# Patient Record
Sex: Male | Born: 1940 | Race: White | Hispanic: No | Marital: Married | Smoking: Former smoker
Health system: Southern US, Community
[De-identification: ages and names within clinical notes are randomized; demographics above are authoritative.]

## PROBLEM LIST (undated history)

## (undated) DIAGNOSIS — F419 Anxiety disorder, unspecified: Secondary | ICD-10-CM

## (undated) DIAGNOSIS — D369 Benign neoplasm, unspecified site: Secondary | ICD-10-CM

## (undated) DIAGNOSIS — G629 Polyneuropathy, unspecified: Secondary | ICD-10-CM

## (undated) DIAGNOSIS — G589 Mononeuropathy, unspecified: Secondary | ICD-10-CM

## (undated) DIAGNOSIS — K649 Unspecified hemorrhoids: Secondary | ICD-10-CM

## (undated) DIAGNOSIS — J449 Chronic obstructive pulmonary disease, unspecified: Secondary | ICD-10-CM

## (undated) DIAGNOSIS — I10 Essential (primary) hypertension: Secondary | ICD-10-CM

## (undated) DIAGNOSIS — G600 Hereditary motor and sensory neuropathy: Secondary | ICD-10-CM

## (undated) DIAGNOSIS — Z8701 Personal history of pneumonia (recurrent): Secondary | ICD-10-CM

## (undated) HISTORY — DX: Benign neoplasm, unspecified site: D36.9

## (undated) HISTORY — PX: CATARACT EXTRACTION, BILATERAL: SHX1313

## (undated) HISTORY — PX: MASTECTOMY FOR GYNECOMASTIA: SUR847

## (undated) HISTORY — DX: Mononeuropathy, unspecified: G58.9

## (undated) HISTORY — PX: TONSILLECTOMY: SUR1361

## (undated) HISTORY — DX: Unspecified hemorrhoids: K64.9

---

## 1994-05-27 DIAGNOSIS — G589 Mononeuropathy, unspecified: Secondary | ICD-10-CM

## 1994-05-27 HISTORY — DX: Mononeuropathy, unspecified: G58.9

## 2005-04-02 ENCOUNTER — Other Ambulatory Visit: Admission: RE | Admit: 2005-04-02 | Discharge: 2005-04-02 | Payer: Self-pay | Admitting: Dermatology

## 2005-04-17 ENCOUNTER — Ambulatory Visit (HOSPITAL_COMMUNITY): Admission: RE | Admit: 2005-04-17 | Discharge: 2005-04-17 | Payer: Self-pay | Admitting: Family Medicine

## 2007-09-10 ENCOUNTER — Ambulatory Visit (HOSPITAL_COMMUNITY): Admission: RE | Admit: 2007-09-10 | Discharge: 2007-09-10 | Payer: Self-pay | Admitting: Family Medicine

## 2008-03-14 ENCOUNTER — Ambulatory Visit (HOSPITAL_COMMUNITY): Admission: RE | Admit: 2008-03-14 | Discharge: 2008-03-14 | Payer: Self-pay | Admitting: Ophthalmology

## 2009-03-13 ENCOUNTER — Ambulatory Visit (HOSPITAL_COMMUNITY): Admission: RE | Admit: 2009-03-13 | Discharge: 2009-03-13 | Payer: Self-pay | Admitting: Ophthalmology

## 2009-09-27 ENCOUNTER — Ambulatory Visit (HOSPITAL_COMMUNITY): Admission: RE | Admit: 2009-09-27 | Discharge: 2009-09-27 | Payer: Self-pay | Admitting: Family Medicine

## 2010-03-20 ENCOUNTER — Encounter: Payer: Self-pay | Admitting: Internal Medicine

## 2010-03-29 ENCOUNTER — Encounter: Payer: Self-pay | Admitting: Internal Medicine

## 2010-04-16 ENCOUNTER — Ambulatory Visit (HOSPITAL_COMMUNITY): Admission: RE | Admit: 2010-04-16 | Discharge: 2010-04-16 | Payer: Self-pay | Admitting: Internal Medicine

## 2010-04-16 ENCOUNTER — Ambulatory Visit: Payer: Self-pay | Admitting: Internal Medicine

## 2010-04-16 HISTORY — PX: COLONOSCOPY: SHX174

## 2010-04-18 ENCOUNTER — Encounter: Payer: Self-pay | Admitting: Internal Medicine

## 2010-06-26 NOTE — Letter (Signed)
Summary: [facility]CS ORDER/TRIAGE  TCS ORDER/TRIAGE   Imported By: Rexene Alberts 03/29/2010 11:41:03  _____________________________________________________________________  External Attachment:    Type:   Image     Comment:   External Document  Appended Document: Patient Notice, Colon Biopsy Results letter mailed to pt

## 2010-06-26 NOTE — Letter (Signed)
Summary: REFERRAL FROM DR Phillips Odor  REFERRAL FROM DR Phillips Odor   Imported By: Rexene Alberts 03/20/2010 15:49:13  _____________________________________________________________________  External Attachment:    Type:   Image     Comment:   External Document

## 2010-06-28 NOTE — Letter (Signed)
Summary: Patient Notice, Colon Biopsy Results  [facility]  [address]   [place], [place] [address]   Phone: [phone]  Fax: [phone]       April 18, 2010   Erik Atkins [address] [place], [place]  [address] [birth year]    Dear Erik Atkins,  I am pleased to inform you that the biopsies taken during your recent colonoscopy did not show any evidence of cancer upon pathologic examination.  Additional information/recommendations:  You should have a repeat colonoscopy examination  in 3 years.  Please call us if you are having persistent problems or have questions about your condition that have not been fully answered at this time.  Sincerely,    R. Roetta Sessions MD, FACP [facility] Ph: [phone]    Fax: [phone]   Appended Document: Patient Notice, Colon Biopsy Results letter mailed to pt  Appended Document: Patient Notice, Colon Biopsy Results reminder in computer

## 2010-08-09 ENCOUNTER — Other Ambulatory Visit (HOSPITAL_COMMUNITY): Payer: Self-pay | Admitting: Family Medicine

## 2010-08-09 ENCOUNTER — Encounter (HOSPITAL_COMMUNITY): Payer: Self-pay

## 2010-08-09 ENCOUNTER — Ambulatory Visit (HOSPITAL_COMMUNITY)
Admission: RE | Admit: 2010-08-09 | Discharge: 2010-08-09 | Disposition: A | Discharge status: Home / Self Care | Payer: PRIVATE HEALTH INSURANCE | Source: Ambulatory Visit | Attending: Family Medicine | Admitting: Family Medicine

## 2010-08-09 DIAGNOSIS — R0989 Other specified symptoms and signs involving the circulatory and respiratory systems: Secondary | ICD-10-CM | POA: Insufficient documentation

## 2010-08-09 HISTORY — DX: Essential (primary) hypertension: I10

## 2010-08-30 LAB — HEMOGLOBIN AND HEMATOCRIT, BLOOD
HCT: 43.6 % (ref 39.0–52.0)
Hemoglobin: 15.2 g/dL (ref 13.0–17.0)

## 2010-08-30 LAB — BASIC METABOLIC PANEL
BUN: 8 mg/dL (ref 6–23)
CO2: 30 mEq/L (ref 19–32)
Calcium: 8.9 mg/dL (ref 8.4–10.5)
Chloride: 88 mEq/L — ABNORMAL LOW (ref 96–112)
Creatinine, Ser: 0.67 mg/dL (ref 0.4–1.5)
GFR calc Af Amer: >60 mL/min (ref >60)
GFR calc non Af Amer: >60 mL/min (ref >60)
Glucose, Bld: 93 mg/dL (ref 70–99)
Potassium: 4 mEq/L (ref 3.5–5.1)
Sodium: 126 mEq/L — ABNORMAL LOW (ref 135–145)

## 2011-02-26 LAB — BASIC METABOLIC PANEL
BUN: 9
CO2: 27
Calcium: 8.9
Chloride: 94 — ABNORMAL LOW
Creatinine, Ser: 0.71
GFR calc Af Amer: >60
GFR calc non Af Amer: >60
Glucose, Bld: 132 — ABNORMAL HIGH
Potassium: 3.7
Sodium: 128 — ABNORMAL LOW

## 2011-02-26 LAB — GLUCOSE, CAPILLARY: Glucose-Capillary: 126 — ABNORMAL HIGH

## 2011-02-26 LAB — HEMOGLOBIN AND HEMATOCRIT, BLOOD
HCT: 44
Hemoglobin: 15.5

## 2011-06-03 ENCOUNTER — Encounter (HOSPITAL_COMMUNITY): Payer: Self-pay | Admitting: Oncology

## 2011-06-03 ENCOUNTER — Encounter (HOSPITAL_COMMUNITY): Payer: Medicare Other | Attending: Oncology | Admitting: Oncology

## 2011-06-03 VITALS — BP 172/80 | HR 64 | Temp 97.6°F | Ht 63.5 in | Wt 136.0 lb

## 2011-06-03 DIAGNOSIS — D72829 Elevated white blood cell count, unspecified: Secondary | ICD-10-CM

## 2011-06-03 DIAGNOSIS — J449 Chronic obstructive pulmonary disease, unspecified: Secondary | ICD-10-CM | POA: Insufficient documentation

## 2011-06-03 DIAGNOSIS — J4489 Other specified chronic obstructive pulmonary disease: Secondary | ICD-10-CM | POA: Insufficient documentation

## 2011-06-03 LAB — CBC
HCT: 46.3 % (ref 39.0–52.0)
Hemoglobin: 16.1 g/dL (ref 13.0–17.0)
MCH: 35.2 pg — ABNORMAL HIGH (ref 26.0–34.0)
MCHC: 34.8 g/dL (ref 30.0–36.0)
Platelets: 341 10*3/uL (ref 150–400)
RBC: 4.58 MIL/uL (ref 4.22–5.81)
RDW: 12.2 % (ref 11.5–15.5)
WBC: 14.5 10*3/uL — ABNORMAL HIGH (ref 4.0–10.5)

## 2011-06-03 LAB — DIFFERENTIAL
Basophils Absolute: 0 10*3/uL (ref 0.0–0.1)
Basophils Relative: 0 % (ref 0–1)
Eosinophils Relative: 2 % (ref 0–5)
Lymphocytes Relative: 13 % (ref 12–46)
Lymphs Abs: 1.9 10*3/uL (ref 0.7–4.0)
Monocytes Absolute: 1.3 10*3/uL — ABNORMAL HIGH (ref 0.1–1.0)
Monocytes Relative: 9 % (ref 3–12)
Neutro Abs: 11.1 10*3/uL — ABNORMAL HIGH (ref 1.7–7.7)
Neutrophils Relative %: 76 % (ref 43–77)

## 2011-06-03 NOTE — Progress Notes (Signed)
CC:   Corrie Mckusick, M.D.  DIAGNOSES: 1. Leukocytosis, unclear etiology. 2. Charcot-Marie-Tooth disease with peripheral neuropathy and wasting     of peripheral musculature. 3. Ulcer on the second toe of the right foot distally, probably     secondary to a combination of peripheral vascular disease and     irritation from deviation of the toes laterally. 4. Peripheral vascular disease with absence of his dorsalis pedis and     posterior tibialis pulses. 5. History of smoking 1-2 packs of cigarettes a day since age 41. 6. History of bilateral cataract operations with lens implants. 7. History tonsillectomy. 8. History mastectomy bilaterally for gynecomastia years ago. 9. Chronic anxiety and, I believe, mild depression. 10.Prednisone intolerance, but that is questionable.  HISTORY:  This is a pleasant, 71 year old gentleman, long-time smoker, who is here today for an elevated white count that has been present since, at least, April 2009.  His white count that time was 12,300 with a left shift and a slight increase in monocytes.  It has gradually risen to a level of 14,800, as of 05/15/2011, still with an absolute granulocyte count that is elevated at 11,300, and a monocyte count this almost exactly the same at 1,200,.  His MCV is 101.8 and hemoglobin 17.6.  The platelet counts have been normal at 280,000.  His MCV, interestingly, has gone down from 103.6 in 2009 to 101.8 in December.  He is here today with his wife of 36 years.  They have no children.  He was married before but has no children by that relationship either.  He drinks about 5 beers a day.  He smokes, as I mentioned, a pack a day to 2 packs a day.  He is presently at 1 pack a day.  FAMILY HISTORY:  His father may well have had the same disorder, but he is not absolutely sure.  His mother lived to be 40 and died of complications of diabetes.  He has a half-brother who died of complications of cirrhosis of the liver  from alcohol.  REVIEW OF SYSTEMS:  Remarked for numbness, tingling, and pain in his legs and feet.  This little ulcer on the second toe medially on the right foot and distally on that toe.  He has absent pulses in his feet. He uses a cane to walk.  He does not have good balance, he states.  He has lost a lot of muscle mass, he states, over the years.  PHYSICAL EXAMINATION:  General Appearance:  He is afebrile.  He states he does not run fevers or chills or night sweats at home.  Vital Signs: Blood pressure 172/80 today.  His pulse is 64 and regular.  Respirations 20 and unlabored at rest.  He does have a pain score of 7 and it is all in his legs and feet, he states.  He has a lot of atrophy of his leg musculature.  A small, 4 mm, ulcerated area distal right second toe, which we cleaned with Betadine and put a Telfa pad on.  He has absence of pulses in his feet.  Radial pulses are intact.  He has decreased venous return on his nails.  He has no adenopathy.  No hepatosplenomegaly.  He has no obvious gynecomastia presently.  Lungs: Marked rhonchi, wheezes, and a few rales intermittently in both lungs, some which clear with coughing.  He has no obvious thyromegaly.  Teeth are in fair repair.  Throat is clear.  Tongue is normal in  the midline. Pupils show the change from prior cataract surgery.  He does not have ankle edema.  No arm edema.  He is alert and very oriented.  He is very hard of hearing at times.  ASSESSMENT AND PLAN:  Mr. Buresh, I believe, has a reactive leukocytosis.  Since he has had it for some time, we will check a BCR/ABL, but I suspect it will be negative.  We will see him back in a couple weeks.  I will try to review his smear and if we need to do more blood work, we will get it done in the very near future.    ______________________________ Ladona Horns. Mariel Sleet, MD ESN/MEDQ  D:  06/03/2011  T:  06/03/2011  Job:  [id number]

## 2011-06-03 NOTE — Progress Notes (Signed)
This office note has been dictated.

## 2011-06-03 NOTE — Patient Instructions (Signed)
Erik Atkins  [id number] 1940-12-27  [facility]  Discharge Instructions  RECOMMENDATIONS MADE BY THE CONSULTANT AND ANY TEST RESULTS WILL BE SENT TO YOUR REFERRING DOCTOR.   EXAM FINDINGS BY MD TODAY AND SIGNS AND SYMPTOMS TO REPORT TO CLINIC OR PRIMARY MD: Per MD you need to stop smoking to help preserve/improve your lungs. Your white blood cell elevation may be due to the lung issues.  MD not concerned about potential leukemia.  MEDICATIONS PRESCRIBED: none      SPECIAL INSTRUCTIONS/FOLLOW-UP: Xray Studies Needed January 10 at 1:45pm and Return to Clinic on after results available.   I acknowledge that I have been informed and understand all the instructions given to me and received a copy. I do not have any more questions at this time, but understand that I may call the Specialty Clinic at [facility] at [phone] during business hours should I have any further questions or need assistance in obtaining follow-up care.    __________________________________________  _____________  __________ Signature of Patient or Authorized Representative            Date                   Time    __________________________________________ Nurse's Signature

## 2011-06-04 LAB — FOLATE: Folate: 13.5 ng/mL

## 2011-06-04 LAB — VITAMIN B12: Vitamin B-12: 513 pg/mL (ref 211–911)

## 2011-06-06 ENCOUNTER — Ambulatory Visit (HOSPITAL_COMMUNITY)
Admission: RE | Admit: 2011-06-06 | Discharge: 2011-06-06 | Disposition: A | Discharge status: Home / Self Care | Payer: Medicare Other | Source: Ambulatory Visit | Attending: Oncology | Admitting: Oncology

## 2011-06-06 DIAGNOSIS — R062 Wheezing: Secondary | ICD-10-CM | POA: Insufficient documentation

## 2011-06-06 DIAGNOSIS — J449 Chronic obstructive pulmonary disease, unspecified: Secondary | ICD-10-CM

## 2011-06-06 DIAGNOSIS — D72829 Elevated white blood cell count, unspecified: Secondary | ICD-10-CM | POA: Insufficient documentation

## 2011-06-06 DIAGNOSIS — F17200 Nicotine dependence, unspecified, uncomplicated: Secondary | ICD-10-CM | POA: Insufficient documentation

## 2011-06-06 DIAGNOSIS — R0989 Other specified symptoms and signs involving the circulatory and respiratory systems: Secondary | ICD-10-CM | POA: Insufficient documentation

## 2011-06-07 LAB — BCR/ABL GENE REARRANGEMENT QNT, PCR
BCR ABL1 / ABL1 IS: 0 not reported
BCR/ABL Source: 0
Prior result - BCRQ: 0

## 2011-06-18 ENCOUNTER — Encounter (HOSPITAL_BASED_OUTPATIENT_CLINIC_OR_DEPARTMENT_OTHER): Payer: Medicare Other | Admitting: Oncology

## 2011-06-18 VITALS — BP 173/84 | HR 80 | Temp 97.4°F | Ht 63.5 in | Wt 135.0 lb

## 2011-06-18 DIAGNOSIS — J449 Chronic obstructive pulmonary disease, unspecified: Secondary | ICD-10-CM

## 2011-06-18 DIAGNOSIS — D72829 Elevated white blood cell count, unspecified: Secondary | ICD-10-CM

## 2011-06-18 DIAGNOSIS — J479 Bronchiectasis, uncomplicated: Secondary | ICD-10-CM

## 2011-06-18 DIAGNOSIS — L97509 Non-pressure chronic ulcer of other part of unspecified foot with unspecified severity: Secondary | ICD-10-CM

## 2011-06-18 NOTE — Progress Notes (Signed)
This office note has been dictated.

## 2011-06-18 NOTE — Patient Instructions (Signed)
Erik Atkins  [id number] Dec 06, 1940   [facility]  Discharge Instructions  RECOMMENDATIONS MADE BY THE CONSULTANT AND ANY TEST RESULTS WILL BE SENT TO YOUR REFERRING DOCTOR.   EXAM FINDINGS BY MD TODAY AND SIGNS AND SYMPTOMS TO REPORT TO CLINIC OR PRIMARY MD: We will refer you to Dr. Juanetta Gosling for a pulmonary consultation.  Think the abnormalities in your blood counts may be due to what's going on in your lungs.  Will release you from our clinic.  We will call you with the appointment with Dr. Juanetta Gosling.  MEDICATIONS PRESCRIBED: none         I acknowledge that I have been informed and understand all the instructions given to me and received a copy. I do not have any more questions at this time, but understand that I may call the Specialty Clinic at [facility] at [phone] during business hours should I have any further questions or need assistance in obtaining follow-up care.    __________________________________________  _____________  __________ Signature of Patient or Authorized Representative            Date                   Time    __________________________________________ Nurse's Signature

## 2011-06-18 NOTE — Progress Notes (Signed)
CC:   Corrie Mckusick, M.D. Edward L. Juanetta Gosling, M.D.  DIAGNOSES: 1. Leukocytosis most likely reactive. 2. Chronic obstructive pulmonary disease with what appears to be     bronchiectasis on the CT scan performed on 06/06/2011, and the     bronchiectasis was present in the right lower lobe. 3. Charcot-Marie-Tooth disease with peripheral neuropathy, wasting of     his peripheral musculature. 4. Ulcer on the second toe of the right foot distally probably     secondary to a combination of peripheral vascular disease and     irritation from deviation of the toes laterally. 5. Peripheral vascular disease with absence of his dorsalis pedis and     posterior tibialis pulses. 6. History of smoking 1-2 packs of cigarettes a day since age 15. 7. History of bilateral cataract operations with lens implants. 8. History of tonsillectomy. 9. Mastectomy bilaterally for gynecomastia many years ago. 10.Chronic anxiety and I believe mild depression. 11.Prednisone intolerance he states.  Jeramey's peripheral smear did not reveal obvious leukemic cells and so we sent off his blood to be on the safe side for PCR analysis for BCR-ABL and that was negative.  We checked him for B12 and folic acid levels. They were both negative.  I did that because his MCV was mildly elevated but was definitely lower than it was several years ago when it was 103. So I think though this gentleman's leukocytosis is most likely reactive to a chronic inflammation, whether it is from the toe ulcer and his peripheral vascular disease or whether it is from his bronchiectasis and his lung disease is a moot point at this juncture.  I do think he needs to see Dr. Juanetta Gosling in consultation for this bronchiectasis.  They are agreeable to that consultation.  We will set that up in the very near future, and I will talk to Dr. Juanetta Gosling myself about him.  I think from our standpoint we can release him at this juncture.  I do not think he  warrants a bone marrow aspirate and biopsy at this time.  So will be on standby should he need to see [place] again.  I have encouraged him once again to quit smoking.  He states he is down to 10 cigarettes a day.    ______________________________ Ladona Horns. Mariel Sleet, MD ESN/MEDQ  D:  06/18/2011  T:  06/18/2011  Job:  [id number]

## 2011-07-10 ENCOUNTER — Ambulatory Visit (HOSPITAL_COMMUNITY)
Admission: RE | Admit: 2011-07-10 | Discharge: 2011-07-10 | Disposition: A | Discharge status: Home / Self Care | Payer: Medicare Other | Source: Ambulatory Visit | Attending: Pulmonary Disease | Admitting: Pulmonary Disease

## 2011-07-10 DIAGNOSIS — R0602 Shortness of breath: Secondary | ICD-10-CM | POA: Insufficient documentation

## 2011-07-10 LAB — BLOOD GAS, ARTERIAL
Acid-Base Excess: 0.4 mmol/L (ref 0.0–2.0)
Bicarbonate: 24.5 mEq/L — ABNORMAL HIGH (ref 20.0–24.0)
O2 Saturation: 95.7 %
Patient temperature: 37
pCO2 arterial: 39.7 mmHg (ref 35.0–45.0)
pH, Arterial: 7.408 (ref 7.350–7.450)

## 2011-07-10 MED ORDER — ALBUTEROL SULFATE (5 MG/ML) 0.5% IN NEBU
2.5000 mg | INHALATION_SOLUTION | Freq: Once | RESPIRATORY_TRACT | Status: AC
  Administered 2011-07-10: 2.5 mg via RESPIRATORY_TRACT

## 2011-07-15 NOTE — Procedures (Signed)
NAMEMARDY, LUCIER NO.:  [id number]  MEDICAL RECORD NO.:  [id number]  LOCATION:  RESP                          FACILITY:  [facility]  PHYSICIAN:  Raeana Blinn L. Juanetta Gosling, M.D.DATE OF BIRTH:  1941-05-10  DATE OF PROCEDURE: DATE OF DISCHARGE:  07/10/2011                           PULMONARY FUNCTION TEST   Reason for pulmonary function testing is shortness of breath. 1. Spirometry shows a moderate-to-severe ventilatory defect with     evidence of airflow obstruction. 2. Lung volumes show no evidence of restrictive change, but do show     air trapping. 3. DLCO is moderately-to-severely reduced. 4. Airway resistance is elevated. 5. There was no significant bronchodilator improvement. 6. Arterial blood gas is normal.     Jaheim Canino L. Juanetta Gosling, M.D.     ELH/MEDQ  D:  07/14/2011  T:  07/15/2011  Job:  [id number]

## 2011-12-24 ENCOUNTER — Other Ambulatory Visit: Payer: Self-pay | Admitting: Neurology

## 2011-12-24 DIAGNOSIS — R2681 Unsteadiness on feet: Secondary | ICD-10-CM

## 2011-12-24 DIAGNOSIS — R29898 Other symptoms and signs involving the musculoskeletal system: Secondary | ICD-10-CM

## 2011-12-27 ENCOUNTER — Ambulatory Visit (HOSPITAL_COMMUNITY)
Admission: RE | Admit: 2011-12-27 | Discharge: 2011-12-27 | Disposition: A | Discharge status: Home / Self Care | Payer: Medicare Other | Source: Ambulatory Visit | Attending: Neurology | Admitting: Neurology

## 2011-12-27 DIAGNOSIS — R269 Unspecified abnormalities of gait and mobility: Secondary | ICD-10-CM | POA: Insufficient documentation

## 2011-12-27 DIAGNOSIS — R2681 Unsteadiness on feet: Secondary | ICD-10-CM

## 2011-12-27 DIAGNOSIS — R29898 Other symptoms and signs involving the musculoskeletal system: Secondary | ICD-10-CM

## 2012-01-31 ENCOUNTER — Encounter (HOSPITAL_COMMUNITY): Payer: Self-pay | Admitting: Pharmacy Technician

## 2012-01-31 ENCOUNTER — Other Ambulatory Visit: Payer: Self-pay | Admitting: Neurosurgery

## 2012-02-04 ENCOUNTER — Encounter (HOSPITAL_COMMUNITY)
Admission: RE | Admit: 2012-02-04 | Discharge: 2012-02-04 | Disposition: A | Discharge status: Home / Self Care | Payer: Medicare Other | Source: Ambulatory Visit | Attending: Neurosurgery | Admitting: Neurosurgery

## 2012-02-04 ENCOUNTER — Encounter (HOSPITAL_COMMUNITY): Payer: Self-pay

## 2012-02-04 HISTORY — DX: Anxiety disorder, unspecified: F41.9

## 2012-02-04 HISTORY — DX: Personal history of pneumonia (recurrent): Z87.01

## 2012-02-04 HISTORY — DX: Polyneuropathy, unspecified: G62.9

## 2012-02-04 HISTORY — DX: Hereditary motor and sensory neuropathy: G60.0

## 2012-02-04 HISTORY — DX: Chronic obstructive pulmonary disease, unspecified: J44.9

## 2012-02-04 NOTE — Pre-Procedure Instructions (Signed)
20 REVANTH NEIDIG  02/04/2012   Your procedure is scheduled on:  September 12  Report to [facility] at 11:30 AM.  Call this number if you have problems the morning of surgery: [phone]   Remember:   Do not eat or drink:After Midnight.  Take these medicines the morning of surgery with A SIP OF WATER: Xanax, Gabapentin, Hydrocodone, Spiriva   Do not wear jewelry, make-up or nail polish.  Do not wear lotions, powders, or perfumes. You may wear deodorant.  Do not shave 48 hours prior to surgery. Men may shave face and neck.  Do not bring valuables to the hospital.  Contacts, dentures or bridgework may not be worn into surgery.  Leave suitcase in the car. After surgery it may be brought to your room.  For patients admitted to the hospital, checkout time is 11:00 AM the day of discharge.   Special Instructions: CHG Shower Use Special Wash: 1/2 bottle night before surgery and 1/2 bottle morning of surgery.   Please read over the following fact sheets that you were given: Pain Booklet, Coughing and Deep Breathing and Surgical Site Infection Prevention

## 2012-02-04 NOTE — Consult Note (Signed)
Anesthesia Consult:  Patient is a 71 year old male scheduled for C3-4, C4-5 posterior laminectomy and fusion on 02/06/12 by Dr. Lovell Sheehan.  I spoke with him during his PAT visit on 02/04/12.  History includes Charcot-Marie-Tooth disease (diagnosed 17 years ago) with peripheral neuropathy and wasting of his peripheral musculture, HTN on Aldoril, COPD, smoking, cataract extractions, gynecomastia s/p bilateral mastectomies, anxiety.  In January 2013, he was evaluated by Hematologist Dr. Mariel Sleet for leukocytosis, felt likely reactive.  His Neurologist is Dr. Beryle Beams in [place], records on chart.  EKG on 02/04/12 showed NSR, LAFB.  Inverted T wave in aVL.  He denies history of prior cardiac testing such as stress, echo, or cath.  He denies chest pain, SOB, and LE edema.  He is not very active due to Charcot-Marie-Tooth disease.  He uses a rolling walker to ambulate.  He has chronic "nerve" pain in BLE and has problems with balance.  Both arms have numbness and pain which is believed related to his c-spine disease.  CXR on 02/04/12 showed stable COPD, no active disease.  PFT on 07/10/11 interpretation by Dr. Kari Baars showed:  1. Spirometry shows a moderate-to-severe ventilatory defect with evidence of airflow obstruction.  2. Lung volumes show no evidence of restrictive change, but do show air trapping.  3. DLCO is moderately-to-severely reduced.  4. Airway resistance is elevated.  5. There was no significant bronchodilator improvement.  6. Arterial blood gas is normal.   Labs done on 01/24/12 at Dr. Lamar Blinks office show a Na 131, K 48, BUN/Cr 12057, glucose 90.  WBC 10.7, H/H 15.4/44.8, PLT 311.  AST/ALT WNL.  I reviewed Charcot-Marie-Tooth disease with Anesthesiologist Dr. Gypsy Balsam and EKGs with Dr. Krista Blue.  Anticipate he can proceed as planned.  Shonna Chock, PA-C

## 2012-02-04 NOTE — Anesthesia Preprocedure Evaluation (Addendum)
Anesthesia Evaluation  Patient identified by MRN, date of birth, ID band Patient awake    Reviewed: Allergy & Precautions, H&P , NPO status , Patient's Chart, lab work & pertinent test results  History of Anesthesia Complications Negative for: history of anesthetic complications  Airway Mallampati: II TM Distance: >3 FB Neck ROM: Full    Dental  (+) Teeth Intact, Dental Advisory Given and Caps   Pulmonary COPD COPD inhaler,  breath sounds clear to auscultation  Pulmonary exam normal       Cardiovascular hypertension, Rhythm:Regular Rate:Normal     Neuro/Psych PSYCHIATRIC DISORDERS Anxiety Charcot-Marie-Tooth Syndrome    GI/Hepatic negative GI ROS, Neg liver ROS,   Endo/Other    Renal/GU negative Renal ROS     Musculoskeletal   Abdominal   Peds  Hematology negative hematology ROS (+)   Anesthesia Other Findings   Reproductive/Obstetrics                          Anesthesia Physical Anesthesia Plan  ASA: III  Anesthesia Plan: General   Post-op Pain Management:    Induction: Intravenous  Airway Management Planned: Oral ETT  Additional Equipment:   Intra-op Plan:   Post-operative Plan:   Informed Consent: I have reviewed the patients History and Physical, chart, labs and discussed the procedure including the risks, benefits and alternatives for the proposed anesthesia with the patient or authorized representative who has indicated his/her understanding and acceptance.   Dental advisory given  Plan Discussed with: CRNA, Anesthesiologist and Surgeon  Anesthesia Plan Comments:         Anesthesia Quick Evaluation

## 2012-02-05 MED ORDER — VANCOMYCIN HCL IN DEXTROSE 1-5 GM/200ML-% IV SOLN
1000.0000 mg | INTRAVENOUS | Status: AC
  Administered 2012-02-06: 1000 mg via INTRAVENOUS
  Filled 2012-02-05: qty 200

## 2012-02-06 ENCOUNTER — Encounter (HOSPITAL_COMMUNITY): Payer: Self-pay | Admitting: Vascular Surgery

## 2012-02-06 ENCOUNTER — Encounter (HOSPITAL_COMMUNITY): Payer: Self-pay | Admitting: *Deleted

## 2012-02-06 ENCOUNTER — Inpatient Hospital Stay (HOSPITAL_COMMUNITY): Payer: Medicare Other

## 2012-02-06 ENCOUNTER — Inpatient Hospital Stay (HOSPITAL_COMMUNITY)
Admission: RE | Admit: 2012-02-06 | Discharge: 2012-02-12 | DRG: 473 | Disposition: A | Discharge status: Home / Self Care | Payer: Medicare Other | Source: Ambulatory Visit | Attending: Neurosurgery | Admitting: Neurosurgery

## 2012-02-06 ENCOUNTER — Encounter (HOSPITAL_COMMUNITY)
Admission: RE | Disposition: A | Discharge status: Home / Self Care | Payer: Self-pay | Source: Ambulatory Visit | Attending: Neurosurgery

## 2012-02-06 ENCOUNTER — Inpatient Hospital Stay (HOSPITAL_COMMUNITY): Payer: Medicare Other | Admitting: Vascular Surgery

## 2012-02-06 DIAGNOSIS — D72829 Elevated white blood cell count, unspecified: Secondary | ICD-10-CM

## 2012-02-06 DIAGNOSIS — Z91018 Allergy to other foods: Secondary | ICD-10-CM

## 2012-02-06 DIAGNOSIS — M4712 Other spondylosis with myelopathy, cervical region: Principal | ICD-10-CM | POA: Diagnosis present

## 2012-02-06 DIAGNOSIS — Z01812 Encounter for preprocedural laboratory examination: Secondary | ICD-10-CM

## 2012-02-06 DIAGNOSIS — R339 Retention of urine, unspecified: Secondary | ICD-10-CM | POA: Diagnosis present

## 2012-02-06 DIAGNOSIS — J4489 Other specified chronic obstructive pulmonary disease: Secondary | ICD-10-CM | POA: Diagnosis present

## 2012-02-06 DIAGNOSIS — F17200 Nicotine dependence, unspecified, uncomplicated: Secondary | ICD-10-CM | POA: Diagnosis present

## 2012-02-06 DIAGNOSIS — Z888 Allergy status to other drugs, medicaments and biological substances status: Secondary | ICD-10-CM

## 2012-02-06 DIAGNOSIS — G600 Hereditary motor and sensory neuropathy: Secondary | ICD-10-CM | POA: Diagnosis present

## 2012-02-06 DIAGNOSIS — J449 Chronic obstructive pulmonary disease, unspecified: Secondary | ICD-10-CM | POA: Diagnosis present

## 2012-02-06 DIAGNOSIS — Z880 Allergy status to penicillin: Secondary | ICD-10-CM

## 2012-02-06 DIAGNOSIS — Q762 Congenital spondylolisthesis: Secondary | ICD-10-CM

## 2012-02-06 DIAGNOSIS — F411 Generalized anxiety disorder: Secondary | ICD-10-CM | POA: Diagnosis present

## 2012-02-06 DIAGNOSIS — Z91012 Allergy to eggs: Secondary | ICD-10-CM

## 2012-02-06 DIAGNOSIS — Z79899 Other long term (current) drug therapy: Secondary | ICD-10-CM

## 2012-02-06 DIAGNOSIS — Z881 Allergy status to other antibiotic agents status: Secondary | ICD-10-CM

## 2012-02-06 DIAGNOSIS — I10 Essential (primary) hypertension: Secondary | ICD-10-CM | POA: Diagnosis present

## 2012-02-06 HISTORY — PX: POSTERIOR CERVICAL FUSION/FORAMINOTOMY: SHX5038

## 2012-02-06 SURGERY — POSTERIOR CERVICAL FUSION/FORAMINOTOMY LEVEL 2
Anesthesia: General | Site: Neck | Wound class: Clean

## 2012-02-06 MED ORDER — ONDANSETRON HCL 4 MG/2ML IJ SOLN
INTRAMUSCULAR | Status: DC | PRN
  Administered 2012-02-06: 4 mg via INTRAVENOUS

## 2012-02-06 MED ORDER — OXYCODONE-ACETAMINOPHEN 5-325 MG PO TABS
1.0000 | ORAL_TABLET | ORAL | Status: DC | PRN
  Administered 2012-02-06 – 2012-02-08 (×2): 2 via ORAL
  Filled 2012-02-06 (×2): qty 1

## 2012-02-06 MED ORDER — GLYCOPYRROLATE 0.2 MG/ML IJ SOLN
INTRAMUSCULAR | Status: DC | PRN
  Administered 2012-02-06: 0.4 mg via INTRAVENOUS

## 2012-02-06 MED ORDER — DOCUSATE SODIUM 100 MG PO CAPS
100.0000 mg | ORAL_CAPSULE | Freq: Two times a day (BID) | ORAL | Status: DC
  Administered 2012-02-06 – 2012-02-12 (×12): 100 mg via ORAL
  Filled 2012-02-06 (×12): qty 1

## 2012-02-06 MED ORDER — HYDROCHLOROTHIAZIDE 25 MG PO TABS
25.0000 mg | ORAL_TABLET | Freq: Two times a day (BID) | ORAL | Status: DC
  Administered 2012-02-07 – 2012-02-12 (×9): 25 mg via ORAL
  Filled 2012-02-06 (×13): qty 1

## 2012-02-06 MED ORDER — THROMBIN 5000 UNITS EX SOLR
CUTANEOUS | Status: DC | PRN
  Administered 2012-02-06 (×2): 5000 [IU] via TOPICAL

## 2012-02-06 MED ORDER — PROPOFOL 10 MG/ML IV BOLUS
INTRAVENOUS | Status: DC | PRN
  Administered 2012-02-06: 100 mg via INTRAVENOUS

## 2012-02-06 MED ORDER — HEMOSTATIC AGENTS (NO CHARGE) OPTIME
TOPICAL | Status: DC | PRN
  Administered 2012-02-06: 1 via TOPICAL

## 2012-02-06 MED ORDER — ACETAMINOPHEN 650 MG RE SUPP: 650.0000 mg | RECTAL | Status: DC | PRN

## 2012-02-06 MED ORDER — SODIUM CHLORIDE 0.9 % IR SOLN
Status: DC | PRN
  Administered 2012-02-06: 17:00:00

## 2012-02-06 MED ORDER — OXYCODONE HCL 5 MG/5ML PO SOLN: 5.0000 mg | Freq: Once | ORAL | Status: DC | PRN

## 2012-02-06 MED ORDER — LIDOCAINE HCL (CARDIAC) 20 MG/ML IV SOLN
INTRAVENOUS | Status: DC | PRN
  Administered 2012-02-06: 60 mg via INTRAVENOUS

## 2012-02-06 MED ORDER — DIAZEPAM 2 MG PO TABS
2.0000 mg | ORAL_TABLET | Freq: Four times a day (QID) | ORAL | Status: DC | PRN
  Administered 2012-02-06: 2.5 mg via ORAL
  Administered 2012-02-07: 2 mg via ORAL
  Filled 2012-02-06: qty 1

## 2012-02-06 MED ORDER — PHENOL 1.4 % MT LIQD: 1.0000 | OROMUCOSAL | Status: DC | PRN

## 2012-02-06 MED ORDER — SODIUM CHLORIDE 0.9 % IV SOLN
INTRAVENOUS | Status: AC
  Filled 2012-02-06: qty 500

## 2012-02-06 MED ORDER — OXYCODONE-ACETAMINOPHEN 5-325 MG PO TABS
ORAL_TABLET | ORAL | Status: AC
  Filled 2012-02-06: qty 2

## 2012-02-06 MED ORDER — IRBESARTAN 75 MG PO TABS
75.0000 mg | ORAL_TABLET | Freq: Every day | ORAL | Status: DC
  Administered 2012-02-07 – 2012-02-12 (×6): 75 mg via ORAL
  Filled 2012-02-06 (×6): qty 1

## 2012-02-06 MED ORDER — BACITRACIN ZINC 500 UNIT/GM EX OINT
TOPICAL_OINTMENT | CUTANEOUS | Status: DC | PRN
  Administered 2012-02-06 (×2): 1 via TOPICAL

## 2012-02-06 MED ORDER — ALPRAZOLAM 0.25 MG PO TABS
0.2500 mg | ORAL_TABLET | Freq: Every day | ORAL | Status: DC
  Administered 2012-02-06 – 2012-02-07 (×4): 0.25 mg via ORAL
  Filled 2012-02-06 (×5): qty 1

## 2012-02-06 MED ORDER — TIOTROPIUM BROMIDE MONOHYDRATE 18 MCG IN CAPS
1.0000 | ORAL_CAPSULE | Freq: Every day | RESPIRATORY_TRACT | Status: DC
  Administered 2012-02-07 – 2012-02-12 (×5): 18 ug via RESPIRATORY_TRACT
  Filled 2012-02-06 (×2): qty 5

## 2012-02-06 MED ORDER — LACTATED RINGERS IV SOLN
INTRAVENOUS | Status: DC
  Administered 2012-02-06: 21:00:00 via INTRAVENOUS

## 2012-02-06 MED ORDER — LACTATED RINGERS IV SOLN
INTRAVENOUS | Status: DC | PRN
  Administered 2012-02-06 (×2): via INTRAVENOUS

## 2012-02-06 MED ORDER — FENTANYL CITRATE 0.05 MG/ML IJ SOLN
INTRAMUSCULAR | Status: DC | PRN
  Administered 2012-02-06: 150 ug via INTRAVENOUS
  Administered 2012-02-06 (×2): 50 ug via INTRAVENOUS

## 2012-02-06 MED ORDER — HYDROMORPHONE HCL PF 1 MG/ML IJ SOLN
INTRAMUSCULAR | Status: AC
  Filled 2012-02-06: qty 2

## 2012-02-06 MED ORDER — ROCURONIUM BROMIDE 100 MG/10ML IV SOLN
INTRAVENOUS | Status: DC | PRN
  Administered 2012-02-06: 30 mg via INTRAVENOUS
  Administered 2012-02-06: 15 mg via INTRAVENOUS
  Administered 2012-02-06 (×3): 5 mg via INTRAVENOUS
  Administered 2012-02-06: 10 mg via INTRAVENOUS

## 2012-02-06 MED ORDER — HYDROMORPHONE HCL PF 1 MG/ML IJ SOLN
0.2500 mg | INTRAMUSCULAR | Status: DC | PRN
  Administered 2012-02-06: 1 mg via INTRAVENOUS
  Administered 2012-02-06 (×2): 0.5 mg via INTRAVENOUS

## 2012-02-06 MED ORDER — PHENYLEPHRINE HCL 10 MG/ML IJ SOLN
INTRAMUSCULAR | Status: DC | PRN
  Administered 2012-02-06 (×5): 80 ug via INTRAVENOUS
  Administered 2012-02-06: 40 ug via INTRAVENOUS

## 2012-02-06 MED ORDER — METHYLDOPA 250 MG PO TABS
250.0000 mg | ORAL_TABLET | Freq: Two times a day (BID) | ORAL | Status: DC
  Administered 2012-02-07 – 2012-02-12 (×9): 250 mg via ORAL
  Filled 2012-02-06 (×13): qty 1

## 2012-02-06 MED ORDER — OXYCODONE HCL 5 MG PO TABS: 5.0000 mg | ORAL_TABLET | Freq: Once | ORAL | Status: DC | PRN

## 2012-02-06 MED ORDER — HYDROMORPHONE HCL PF 1 MG/ML IJ SOLN
INTRAMUSCULAR | Status: AC
  Filled 2012-02-06: qty 1

## 2012-02-06 MED ORDER — MORPHINE SULFATE 2 MG/ML IJ SOLN
1.0000 mg | INTRAMUSCULAR | Status: DC | PRN
  Administered 2012-02-06: 2 mg via INTRAVENOUS
  Filled 2012-02-06: qty 1

## 2012-02-06 MED ORDER — ONDANSETRON HCL 4 MG/2ML IJ SOLN: 4.0000 mg | INTRAMUSCULAR | Status: DC | PRN

## 2012-02-06 MED ORDER — BACITRACIN 50000 UNITS IM SOLR
INTRAMUSCULAR | Status: AC
  Filled 2012-02-06: qty 1

## 2012-02-06 MED ORDER — MIDAZOLAM HCL 5 MG/5ML IJ SOLN
INTRAMUSCULAR | Status: DC | PRN
  Administered 2012-02-06: 1 mg via INTRAVENOUS

## 2012-02-06 MED ORDER — 0.9 % SODIUM CHLORIDE (POUR BTL) OPTIME
TOPICAL | Status: DC | PRN
  Administered 2012-02-06: 1000 mL

## 2012-02-06 MED ORDER — VANCOMYCIN HCL 500 MG IV SOLR
500.0000 mg | Freq: Once | INTRAVENOUS | Status: AC
  Administered 2012-02-07: 500 mg via INTRAVENOUS
  Filled 2012-02-06: qty 500

## 2012-02-06 MED ORDER — DROPERIDOL 2.5 MG/ML IJ SOLN: 0.6250 mg | INTRAMUSCULAR | Status: DC | PRN

## 2012-02-06 MED ORDER — CYANOCOBALAMIN 1000 MCG/ML IJ SOLN
1000.0000 ug | INTRAMUSCULAR | Status: DC
  Administered 2012-02-06: 1000 ug via INTRAMUSCULAR
  Filled 2012-02-06: qty 1

## 2012-02-06 MED ORDER — ACETAMINOPHEN 325 MG PO TABS: 650.0000 mg | ORAL_TABLET | ORAL | Status: DC | PRN

## 2012-02-06 MED ORDER — METHYLDOPA-HYDROCHLOROTHIAZIDE 250-25 MG PO TABS
1.0000 | ORAL_TABLET | Freq: Two times a day (BID) | ORAL | Status: DC
  Filled 2012-02-06: qty 1

## 2012-02-06 MED ORDER — BUPIVACAINE-EPINEPHRINE PF 0.5-1:200000 % IJ SOLN
INTRAMUSCULAR | Status: DC | PRN
  Administered 2012-02-06: 10 mL

## 2012-02-06 MED ORDER — HYDROCODONE-ACETAMINOPHEN 5-325 MG PO TABS
1.0000 | ORAL_TABLET | ORAL | Status: DC | PRN
  Administered 2012-02-07: 2 via ORAL
  Administered 2012-02-07: 1 via ORAL
  Administered 2012-02-07: 2 via ORAL
  Administered 2012-02-08 (×3): 1 via ORAL
  Administered 2012-02-09 – 2012-02-12 (×12): 2 via ORAL
  Filled 2012-02-06 (×11): qty 2
  Filled 2012-02-06 (×4): qty 1
  Filled 2012-02-06 (×4): qty 2

## 2012-02-06 MED ORDER — TRIPELENNAMINE HCL POWD: 25.0000 mg | Freq: Two times a day (BID) | Status: DC

## 2012-02-06 MED ORDER — GABAPENTIN 600 MG PO TABS
600.0000 mg | ORAL_TABLET | Freq: Three times a day (TID) | ORAL | Status: DC
  Administered 2012-02-06 – 2012-02-12 (×14): 600 mg via ORAL
  Filled 2012-02-06 (×19): qty 1

## 2012-02-06 MED ORDER — DIAZEPAM 5 MG PO TABS
ORAL_TABLET | ORAL | Status: AC
  Filled 2012-02-06: qty 1

## 2012-02-06 MED ORDER — NEOSTIGMINE METHYLSULFATE 1 MG/ML IJ SOLN
INTRAMUSCULAR | Status: DC | PRN
  Administered 2012-02-06: 3 mg via INTRAVENOUS

## 2012-02-06 MED ORDER — MENTHOL 3 MG MT LOZG
1.0000 | LOZENGE | OROMUCOSAL | Status: DC | PRN
  Filled 2012-02-06: qty 9

## 2012-02-06 SURGICAL SUPPLY — 67 items
APL SKNCLS STERI-STRIP NONHPOA (GAUZE/BANDAGES/DRESSINGS) ×1
BAG DECANTER FOR FLEXI CONT (MISCELLANEOUS) ×2 IMPLANT
BENZOIN TINCTURE PRP APPL 2/3 (GAUZE/BANDAGES/DRESSINGS) ×2 IMPLANT
BIT DRILL ANGLD TIP SCRW 3.5 (BIT) ×2 IMPLANT
BIT DRILL NEURO 2X3.1 SFT TUCH (MISCELLANEOUS) ×1 IMPLANT
BLADE SURG ROTATE 9660 (MISCELLANEOUS) ×1 IMPLANT
BLADE ULTRA TIP 2M (BLADE) IMPLANT
BRUSH SCRUB EZ PLAIN DRY (MISCELLANEOUS) ×2 IMPLANT
BUR MATCHSTICK NEURO 3.0 LAGG (BURR) ×1 IMPLANT
CANISTER SUCTION 2500CC (MISCELLANEOUS) ×2 IMPLANT
CAP ELLIPSE LOCKING (Cap) ×5 IMPLANT
CLOTH BEACON ORANGE TIMEOUT ST (SAFETY) ×2 IMPLANT
CONT SPEC 4OZ CLIKSEAL STRL BL (MISCELLANEOUS) ×2 IMPLANT
DRAPE C-ARM 42X72 X-RAY (DRAPES) ×5 IMPLANT
DRAPE LAPAROTOMY 100X72 PEDS (DRAPES) ×2 IMPLANT
DRAPE MICROSCOPE LEICA (MISCELLANEOUS) ×1 IMPLANT
DRAPE POUCH INSTRU U-SHP 10X18 (DRAPES) ×2 IMPLANT
DRAPE SURG 17X23 STRL (DRAPES) ×5 IMPLANT
DRILL NEURO 2X3.1 SOFT TOUCH (MISCELLANEOUS) ×2
ELECT BLADE 4.0 EZ CLEAN MEGAD (MISCELLANEOUS) ×2
ELECT REM PT RETURN 9FT ADLT (ELECTROSURGICAL) ×2
ELECTRODE BLDE 4.0 EZ CLN MEGD (MISCELLANEOUS) ×1 IMPLANT
ELECTRODE REM PT RTRN 9FT ADLT (ELECTROSURGICAL) ×1 IMPLANT
GAUZE SPONGE 4X4 16PLY XRAY LF (GAUZE/BANDAGES/DRESSINGS) IMPLANT
GLOVE BIO SURGEON STRL SZ 6.5 (GLOVE) ×4 IMPLANT
GLOVE BIO SURGEON STRL SZ8.5 (GLOVE) ×2 IMPLANT
GLOVE BIOGEL PI IND STRL 7.0 (GLOVE) IMPLANT
GLOVE BIOGEL PI INDICATOR 7.0 (GLOVE) ×1
GLOVE ECLIPSE 7.5 STRL STRAW (GLOVE) ×2 IMPLANT
GLOVE EXAM NITRILE LRG STRL (GLOVE) IMPLANT
GLOVE EXAM NITRILE MD LF STRL (GLOVE) ×1 IMPLANT
GLOVE EXAM NITRILE XL STR (GLOVE) IMPLANT
GLOVE EXAM NITRILE XS STR PU (GLOVE) IMPLANT
GLOVE SS BIOGEL STRL SZ 8 (GLOVE) ×1 IMPLANT
GLOVE SUPERSENSE BIOGEL SZ 8 (GLOVE) ×1
GOWN BRE IMP SLV AUR LG STRL (GOWN DISPOSABLE) ×4 IMPLANT
GOWN BRE IMP SLV AUR XL STRL (GOWN DISPOSABLE) ×2 IMPLANT
GOWN STRL REIN 2XL LVL4 (GOWN DISPOSABLE) IMPLANT
KIT BASIN OR (CUSTOM PROCEDURE TRAY) ×2 IMPLANT
KIT ROOM TURNOVER OR (KITS) ×2 IMPLANT
NDL SPNL 18GX3.5 QUINCKE PK (NEEDLE) ×1 IMPLANT
NEEDLE HYPO 22GX1.5 SAFETY (NEEDLE) ×2 IMPLANT
NEEDLE SPNL 18GX3.5 QUINCKE PK (NEEDLE) ×2 IMPLANT
NS IRRIG 1000ML POUR BTL (IV SOLUTION) ×2 IMPLANT
PACK LAMINECTOMY NEURO (CUSTOM PROCEDURE TRAY) ×2 IMPLANT
PAD ARMBOARD 7.5X6 YLW CONV (MISCELLANEOUS) ×4 IMPLANT
PIN MAYFIELD SKULL DISP (PIN) ×2 IMPLANT
PUTTY 2.5ML ACTIFUSE ABX (Putty) ×1 IMPLANT
ROD ELLIPSE 40MMX3.5 (Rod) ×2 IMPLANT
RUBBERBAND STERILE (MISCELLANEOUS) ×4 IMPLANT
SCREW ELLIPSE 14X3.5MM (Screw) ×6 IMPLANT
SPONGE GAUZE 4X4 12PLY (GAUZE/BANDAGES/DRESSINGS) ×2 IMPLANT
SPONGE LAP 4X18 X RAY DECT (DISPOSABLE) IMPLANT
SPONGE SURGIFOAM ABS GEL SZ50 (HEMOSTASIS) ×2 IMPLANT
STAPLER SKIN PROX WIDE 3.9 (STAPLE) ×1 IMPLANT
STRIP CLOSURE SKIN 1/2X4 (GAUZE/BANDAGES/DRESSINGS) ×2 IMPLANT
SUT ETHILON 2 0 FS 18 (SUTURE) IMPLANT
SUT VIC AB 0 CT1 18XCR BRD8 (SUTURE) ×2 IMPLANT
SUT VIC AB 0 CT1 8-18 (SUTURE) ×4
SUT VIC AB 2-0 CP2 18 (SUTURE) ×3 IMPLANT
SYR 20ML ECCENTRIC (SYRINGE) ×2 IMPLANT
TAPE CLOTH SURG 4X10 WHT LF (GAUZE/BANDAGES/DRESSINGS) ×1 IMPLANT
TOWEL OR 17X24 6PK STRL BLUE (TOWEL DISPOSABLE) ×2 IMPLANT
TOWEL OR 17X26 10 PK STRL BLUE (TOWEL DISPOSABLE) ×2 IMPLANT
TRAY FOLEY CATH 14FRSI W/METER (CATHETERS) IMPLANT
UNDERPAD 30X30 INCONTINENT (UNDERPADS AND DIAPERS) IMPLANT
WATER STERILE IRR 1000ML POUR (IV SOLUTION) ×2 IMPLANT

## 2012-02-06 NOTE — Anesthesia Procedure Notes (Signed)
Procedure Name: Intubation Date/Time: 02/06/2012 4:40 PM Performed by: Margaree Mackintosh Pre-anesthesia Checklist: Patient identified, Timeout performed, Emergency Drugs available, Suction available and Patient being monitored Patient Re-evaluated:Patient Re-evaluated prior to inductionOxygen Delivery Method: Circle system utilized Preoxygenation: Pre-oxygenation with 100% oxygen Intubation Type: IV induction Ventilation: Mask ventilation without difficulty Laryngoscope size: Glidescope. Grade View: Grade I Tube size: 7.0 mm Number of attempts: 1 Airway Equipment and Method: Stylet and LTA kit utilized Placement Confirmation: ETT inserted through vocal cords under direct vision,  positive ETCO2 and breath sounds checked- equal and bilateral Secured at: 20 cm Tube secured with: Tape Dental Injury: Teeth and Oropharynx as per pre-operative assessment

## 2012-02-06 NOTE — Transfer of Care (Signed)
Immediate Anesthesia Transfer of Care Note  Patient: Erik Atkins  Procedure(s) Performed: Procedure(s) (LRB) with comments: POSTERIOR CERVICAL FUSION/FORAMINOTOMY LEVEL 2 (N/A) - Posterior Cervical Three-Four/Four-Five Laminectomy and Fusion with Instrumentation  Patient Location: PACU  Anesthesia Type: General  Level of Consciousness: awake, alert , oriented and patient cooperative  Airway & Oxygen Therapy: Patient Spontanous Breathing and Patient connected to nasal cannula oxygen  Post-op Assessment: Report given to PACU RN, Post -op Vital signs reviewed and stable and Patient moving all extremities X 4  Post vital signs: Reviewed and stable  Complications: No apparent anesthesia complications

## 2012-02-06 NOTE — Anesthesia Postprocedure Evaluation (Signed)
Anesthesia Post Note  Patient: Erik Atkins  Procedure(s) Performed: Procedure(s) (LRB): POSTERIOR CERVICAL FUSION/FORAMINOTOMY LEVEL 2 (N/A)  Anesthesia type: general  Patient location: PACU  Post pain: Pain level controlled  Post assessment: Patient's Cardiovascular Status Stable  Last Vitals:  Filed Vitals:   02/06/12 1117  BP: 120/62  Pulse: 58  Temp: 36.4 C  Resp: 18    Post vital signs: Reviewed and stable  Level of consciousness: sedated  Complications: No apparent anesthesia complications

## 2012-02-06 NOTE — Op Note (Signed)
Brief history: The patient is a 71 year old white male who has complained of trouble walking and hand numbness/weakness. He was worked up with a cervical MRI which demonstrated severe stenosis at C3-4 and C4-5. I discussed the various treatment options with the patient and his family including surgery. The patient has weighed the risks, benefits, and alternatives surgery and decided proceed with a C3-4 and C4-5 decompressive laminectomy, instrumentation and fusion.  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 Delma Officer  Asst. Dr. Colon Branch  Anesthesia: Gen. endotracheal  Estimated blood loss: 75 cc  Specimens: None  Drains: None  Complications: None  Description of procedure: The patient was brought to the operating room by the anesthesia team. General endotracheal anesthesia was induced. The [place] 3 point headrest was applied to the patient's calvarium. He was then carefully turned to the prone position on the chest rolls. The patient's cervical alignment was then found to be satisfactorily with the intraoperative fluoroscopy. The patient's suboccipital region was then shaved with clippers and this region as well as a posterior cervical and upper thoracic region was then prepared with Betadine scrub and Betadine solution. Sterile drapes were applied. I then injected the area to be incised with Marcaine with epinephrine solution. I then used a scalpel to make a linear midline incision over the C3-4 C4-5 and C5-6 interspaces. I used a cautery perform a bilateral subperiosteal dissection exposing the  spinous process and lamina of C2-C5. We used intraoperative fluoroscopy to confirm our location. We inserted the [employer] retractor for exposure.  We began the decompression by using the Le[place]ell nodule were to remove the spinous process of C3 and C4. We then brought the operative microscope into the field and under its medication and illumination we completed the decompression. I used a high-speed drill to drill away the lamina of C3 and C4. We then used microdissection to expose and free up the underlying dura. We then used a Kerrison punches to remove the remainder of the C2-3, C3-4 and C4-5 ligamentum flavum decompressing the thecal sac/spinal cord. We used a Kerrison punch to remove the cephalad aspect of the C5 lamina. At this point the spinal cord appeared well decompressed.  We now turned attention to the posterior agitation. We used light cautery to expose the lateral masses at C3-C4 and C5. I then used intraoperative fluoroscopy to drill a 14 mm hole in the lateral mass at C3 and C4 and a cephalad and lateral direction. We could not use the fluoroscopy at C5 because the patient's shoulders were in the way. We used standard anatomic landmarks at this level. After drilling these holes we used a ball probe to rule out cortical breaches. We then tapped the holes and placed 14 mm screws into the lateral masses at C3, C4 and C5 bilaterally. On the left side the screws were too close together so we had to remove the C4 lateral mass screw. We then cut and contoured the appropriate length rod and connected the unilateral lateral mass screws with a rod. We secured in place with the caps which were tightened appropriately. This completed the instrumentation.  We now turned attention to the posterior lateral arthrodesis. I used a high-speed drill  to the corticated remainder of the facets and lateral masses at C3-4 and C4-5. We then laid a combination of local morselized autograft bone that we obtained during  the decompression as well as Actifuse bone graft extender over these decorticated posterior lateral structures. This completed the posterior lateral arthrodesis.  We then obtained hemostasis using bipolar cautery. We. The wound out with bacitracin solution. We then removed the retractors. We then reapproximated patient's cervical thoracic fascia with interrupted #1 Vicryl suture. We reapproximated the subcutaneous tissue with interrupted 2-0 Vicryl suture. We then reapproximated the skin with Steri-Strips and benzoin. The wound was then coated with preservation ointment. A sterile dressing was applied. The drapes were removed. We then removed turned the patient to the supine position where we removed the [place] 3 point headrest from his calvarium. The patient was subtotally transported to the post anesthesia care unit in stable condition. All sponge instrument and needle counts were reportedly correct at the end this case.

## 2012-02-06 NOTE — Progress Notes (Signed)
Subjective:  The patient is somnolent but easily arousable. His neck is appropriately sore.  Objective: Vital signs in last 24 hours: Temp:  [97.6 F (36.4 C)] 97.6 F (36.4 C) (09/12 1117) Pulse Rate:  [58] 58  (09/12 1117) Resp:  [18] 18  (09/12 1117) BP: (120)/(62) 120/62 mmHg (09/12 1117) SpO2:  [97 %] 97 % (09/12 1117)  Intake/Output from previous day:   Intake/Output this shift:    Physical exam the patient is somnolent but easily arousable. He is moving all 4 extremities. His dressing is clean and dry.  Lab Results: No results found for this basename: WBC:2,HGB:2,HCT:2,PLT:2 in the last 72 hours BMET No results found for this basename: NA:2,K:2,CL:2,CO2:2,GLUCOSE:2,BUN:2,CREATININE:2,CALCIUM:2 in the last 72 hours  Studies/Results: Dg Cervical Spine 1 View  02/06/2012  *RADIOLOGY REPORT*  Clinical Data: 71 year old male undergoing cervical spine surgery.  DG C-ARM 61-120 MIN, DG CERVICAL SPINE - 1 VIEW  Technique: Intraoperative lateral fluoroscopic view of the cervical spine.  Comparison:  Cervical MRI 12/27/2011.  Findings: This single intraoperative image demonstrates posterior laminar screw placement at the C3, C4, and probably also C5 levels.  IMPRESSION: Posterior laminar hardware placement as above.   Original Report Authenticated By: Harley Hallmark, M.D.    Dg C-arm 61-120 Min  02/06/2012  *RADIOLOGY REPORT*  Clinical Data: 71 year old male undergoing cervical spine surgery.  DG C-ARM 61-120 MIN, DG CERVICAL SPINE - 1 VIEW  Technique: Intraoperative lateral fluoroscopic view of the cervical spine.  Comparison:  Cervical MRI 12/27/2011.  Findings: This single intraoperative image demonstrates posterior laminar screw placement at the C3, C4, and probably also C5 levels.  IMPRESSION: Posterior laminar hardware placement as above.   Original Report Authenticated By: Harley Hallmark, M.D.     Assessment/Plan: The patient is doing well.  LOS: 0 days     Dione Petron  D 02/06/2012, 7:37 PM

## 2012-02-06 NOTE — Progress Notes (Signed)
ANTIBIOTIC CONSULT NOTE - INITIAL  Pharmacy Consult for Vancomycin Indication: Post op cervical surgery  Allergies  Allergen Reactions  . Penicillins Anaphylaxis  . Darvon Swelling  . Celexa (Citalopram Hydrobromide) Nausea And Vomiting  . Cortisone Swelling  . Cymbalta (Duloxetine Hcl) Nausea Only  . Eggs Or Egg-Derived Products Nausea And Vomiting  . Guaifenesin Er   . Keflex (Cephalexin) Swelling  . Motrin (Ibuprofen)   . Pumpkin Seed Nausea And Vomiting    All pumpkin products  . Sweet Potato Nausea And Vomiting       . Tea Nausea And Vomiting  . Zoloft (Sertraline) Nausea Only    Patient Measurements: Height: 5\' 5"  (165.1 cm) Weight: 122 lb 2.2 oz (55.4 kg) IBW/kg (Calculated) : 61.5   Vital Signs: Temp: 97.3 F (36.3 C) (09/12 2213) Temp src: Oral (09/12 2213) BP: 146/57 mmHg (09/12 2213) Pulse Rate: 78  (09/12 2213) Intake/Output from previous day:   Intake/Output from this shift: Total I/O In: 1000 [I.V.:1000] Out: 100 [Blood:100]  Labs: No results found for this basename: WBC:3,HGB:3,PLT:3,LABCREA:3,CREATININE:3 in the last 72 hours Estimated Creatinine Clearance: 65.4 ml/min (by C-G formula based on Cr of 0.67). No results found for this basename: VANCOTROUGH:2,VANCOPEAK:2,VANCORANDOM:2,GENTTROUGH:2,GENTPEAK:2,GENTRANDOM:2,TOBRATROUGH:2,TOBRAPEAK:2,TOBRARND:2,AMIKACINPEAK:2,AMIKACINTROU:2,AMIKACIN:2, in the last 72 hours   Microbiology: Recent Results (from the past 720 hour(s))  SURGICAL PCR SCREEN     Status: Normal   Collection Time   02/04/12 12:51 PM      Component Value Range Status Comment   MRSA, PCR NEGATIVE  NEGATIVE Final    Staphylococcus aureus NEGATIVE  NEGATIVE Final     Medical History: Past Medical History  Diagnosis Date  . Hypertension   . Nerve disorder 1996    "Charot Marie"  . COPD (chronic obstructive pulmonary disease)   . Charcot Marie Tooth muscular atrophy   . Neuropathy   . Anxiety   . History of pneumonia      Medications:  Prescriptions prior to admission  Medication Sig Dispense Refill  . ALPRAZolam (XANAX) 0.25 MG tablet Take 0.25 mg by mouth 5 (five) times daily.        . cyanocobalamin (,VITAMIN B-12,) 1000 MCG/ML injection Inject 1,000 mcg into the muscle every 30 (thirty) days.      Marland Kitchen gabapentin (NEURONTIN) 600 MG tablet Take 600 mg by mouth 3 (three) times daily.        Marland Kitchen HYDROcodone-acetaminophen (VICODIN) 2.5-500 MG per tablet Take 1 tablet by mouth every 6 (six) hours as needed. For pain      . methyldopa-hydrochlorothiazide (ALDORIL-25) 250-25 MG per tablet Take 1 tablet by mouth 2 (two) times daily.       . Tripelennamine HCl POWD Take 25 mg by mouth 2 (two) times daily.       . valsartan (DIOVAN) 80 MG tablet Take 80 mg by mouth daily.        Marland Kitchen SPIRIVA HANDIHALER 18 MCG inhalation capsule Place 1 capsule into inhaler and inhale daily.        Admit Complaint: 71 y.o.  male  admitted 02/06/2012 s/p cervical laminectomy.  Pharmacy consulted to dose vancomycin x 1 dose.  Assessment: Infectious Disease: One dose vancomcyin for post op prophylaxsis, pre op dose given at 1630, follow up with a single dose of 500 mg at 4 am as no drains are in place. Antibiotics: Vancomcyin 9/12>> Cultures:  PTA Medication Issues: Home Meds Not Ordered: methyldopa-HCTZ  Best Practices: DVT Prophylaxis:  SCDs   Goal of Therapy:  Vancomycin trough level 10-15  mcg/ml  Plan:  1. Vancomycin 500 mg IV x 1 at 4 am 9/13  Thank you for allowing pharmacy to be a part of this patients care team.  Lovenia Kim Pharm.D., BCPS Clinical Pharmacist 02/06/2012 10:37 PM Pager: [phone] Phone: [phone]

## 2012-02-06 NOTE — H&P (Signed)
Subjective: The patient is a 71 year old white male who's had arm tingling neck pain and difficulty with ambulation for months.  He has failed medical management and was worked up with a cervical MRI. This demonstrated severe stenosis and spondylolisthesis at C3-4 and C4-5. I discussed the various treatment options with the patient including surgery. He has weighed the risks, benefits, and alternatives surgery and decided proceed with a decompressive cervical laminectomy with fusion and plating.  Past Medical History  Diagnosis Date  . Hypertension   . Nerve disorder 1996    "Charot Marie"  . COPD (chronic obstructive pulmonary disease)   . Charcot Marie Tooth muscular atrophy   . Neuropathy   . Anxiety   . History of pneumonia     Past Surgical History  Procedure Date  . Tonsillectomy   . Mastectomy for gynecomastia 1979 and 1981  . Cataract extraction, bilateral   . Colonoscopy     Allergies  Allergen Reactions  . Penicillins Anaphylaxis  . Darvon Swelling  . Celexa (Citalopram Hydrobromide) Nausea And Vomiting  . Cortisone Swelling  . Cymbalta (Duloxetine Hcl) Nausea Only  . Eggs Or Egg-Derived Products Nausea And Vomiting  . Guaifenesin Er   . Keflex (Cephalexin) Swelling  . Motrin (Ibuprofen)   . Pumpkin Seed Nausea And Vomiting    All pumpkin products  . Sweet Potato Nausea And Vomiting       . Tea Nausea And Vomiting  . Zoloft (Sertraline) Nausea Only    History  Substance Use Topics  . Smoking status: Current Every Day Smoker -- 0.5 packs/day for 50 years    Types: Cigarettes  . Smokeless tobacco: Never Used  . Alcohol Use: 12.6 oz/week    21 Cans of beer per week     2-3 beers a day    History reviewed. No pertinent family history. Prior to Admission medications   Medication Sig Start Date End Date Taking? Authorizing Provider  ALPRAZolam (XANAX) 0.25 MG tablet Take 0.25 mg by mouth 5 (five) times daily.     Yes Historical Provider, MD  cyanocobalamin  (,VITAMIN B-12,) 1000 MCG/ML injection Inject 1,000 mcg into the muscle every 30 (thirty) days.   Yes Historical Provider, MD  gabapentin (NEURONTIN) 600 MG tablet Take 600 mg by mouth 3 (three) times daily.     Yes Historical Provider, MD  HYDROcodone-acetaminophen (VICODIN) 2.5-500 MG per tablet Take 1 tablet by mouth every 6 (six) hours as needed. For pain   Yes Historical Provider, MD  methyldopa-hydrochlorothiazide (ALDORIL-25) 250-25 MG per tablet Take 1 tablet by mouth 2 (two) times daily.    Yes Historical Provider, MD  Tripelennamine HCl POWD Take 25 mg by mouth 2 (two) times daily.    Yes Historical Provider, MD  valsartan (DIOVAN) 80 MG tablet Take 80 mg by mouth daily.     Yes Historical Provider, MD  SPIRIVA HANDIHALER 18 MCG inhalation capsule Place 1 capsule into inhaler and inhale daily. 12/11/11   Historical Provider, MD     Review of Systems  Positive ROS: As above  All other systems have been reviewed and were otherwise negative with the exception of those mentioned in the HPI and as above.  Objective: Vital signs in last 24 hours: Temp:  [97.6 F (36.4 C)] 97.6 F (36.4 C) (09/12 1117) Pulse Rate:  [58] 58  (09/12 1117) Resp:  [18] 18  (09/12 1117) BP: (120)/(62) 120/62 mmHg (09/12 1117) SpO2:  [97 %] 97 % (09/12 1117)  General Appearance:  Alert, cooperative, no distress, appears stated age Head: Normocephalic, without obvious abnormality, atraumatic Eyes: PERRL, conjunctiva/corneas clear, EOM's intact, fundi benign, both eyes      Ears: Normal TM's and external ear canals, both ears Throat: Lips, mucosa, and tongue normal; teeth and gums normal Neck: Supple, symmetrical, trachea midline, no adenopathy; thyroid: No enlargement/tenderness/nodules; no carotid bruit or JVD Back: Symmetric, no curvature, ROM normal, no CVA tenderness Lungs: Clear to auscultation bilaterally, respirations unlabored Heart: Regular rate and rhythm, S1 and S2 normal, no murmur, rub or  gallop Abdomen: Soft, non-tender, bowel sounds active all four quadrants, no masses, no organomegaly Extremities: Extremities normal, atraumatic, no cyanosis or edema Pulses: 2+ and symmetric all extremities Skin: Skin color, texture, turgor normal, no rashes or lesions  NEUROLOGIC:   Mental status: alert and oriented, no aphasia, good attention span, Fund of knowledge/ memory ok Motor Exam - grossly normal except the patient has weakness in his bilateral hand grips at 4/5 Sensory Exam - grossly normal Reflexes: The patient is hyperreflexic Coordination - grossly normal Gait - grossly normal Balance - grossly normal Cranial Nerves: I: smell Not tested  II: visual acuity  OS: Normal    OD: Normal   II: visual fields Full to confrontation  II: pupils Equal, round, reactive to light  III,VII: ptosis None  III,IV,VI: extraocular muscles  Full ROM  V: mastication Normal  V: facial light touch sensation  Normal  V,VII: corneal reflex  Present  VII: facial muscle function - upper  Normal  VII: facial muscle function - lower Normal  VIII: hearing Not tested  IX: soft palate elevation  Normal  IX,X: gag reflex Present  XI: trapezius strength  5/5  XI: sternocleidomastoid strength 5/5  XI: neck flexion strength  5/5  XII: tongue strength  Normal    Data Review Lab Results  Component Value Date   WBC 14.5* 06/03/2011   HGB 16.1 06/03/2011   HCT 46.3 06/03/2011   MCV 101.1* 06/03/2011   PLT 341 06/03/2011   Lab Results  Component Value Date   NA 126* 03/02/2009   K 4.0 03/02/2009   CL 88* 03/02/2009   CO2 30 03/02/2009   BUN 8 03/02/2009   CREATININE 0.67 03/02/2009   GLUCOSE 93 03/02/2009   No results found for this basename: INR, PROTIME    Assessment/Plan: 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 I reviewed the MR scan with them and pointed out the abnormalities. We  have discussed the various treatment options including surgery. I have described the surgical option of a C3-4 and C4-5 decompressive laminectomy, fusion, and instrumentation. I described the surgery to them. I've shown him surgical models. I discussed the risks, benefits, alternatives, and likelihood of achieving our goals with surgery. I've answered all their questions. The patient has decided proceed with surgery.   Cristi Loron 02/06/2012 3:34 PM

## 2012-02-07 MED ORDER — TAMSULOSIN HCL 0.4 MG PO CAPS
0.4000 mg | ORAL_CAPSULE | Freq: Every day | ORAL | Status: DC
  Administered 2012-02-07 – 2012-02-12 (×6): 0.4 mg via ORAL
  Filled 2012-02-07 (×6): qty 1

## 2012-02-07 MED ORDER — TAMSULOSIN HCL 0.4 MG PO CAPS: 0.4000 mg | ORAL_CAPSULE | Freq: Every day | ORAL | Status: DC

## 2012-02-07 MED ORDER — DSS 100 MG PO CAPS: 100.0000 mg | ORAL_CAPSULE | Freq: Two times a day (BID) | ORAL | Status: AC

## 2012-02-07 MED ORDER — OXYCODONE-ACETAMINOPHEN 5-325 MG PO TABS: 1.0000 | ORAL_TABLET | ORAL | Status: AC | PRN

## 2012-02-07 NOTE — Progress Notes (Signed)
UR COMPLETED  

## 2012-02-07 NOTE — Discharge Summary (Signed)
  Physician Discharge Summary  Patient ID: Erik Atkins MRN: [id number] DOB/AGE: 1941-02-21 71 y.o.  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 *    Discharged Condition: fair  Hospital Course: I admitted the patient to [facility] on 02/06/2012. On that day I performed a C3, C4 and partial C5 laminectomy with instrumentation and fusion at C3-4-4 5. The surgery went well. The patient's postoperative course was unremarkable except he has some urinary retention. The patient was requesting discharge home on postop day #1. His urinary retention resolved and he was discharged. The patient was given oral and written discharge instructions. All his questions were answered.  Consults: None Significant Diagnostic Studies: None Treatments: C3-4 and C4-5 decompression instrumentation and fusion. Discharge Exam: Blood pressure 128/54, pulse 99, temperature 97.9 F (36.6 C), temperature source Oral, resp. rate 16, height 5\' 5"  (1.651 m), weight 55.4 kg (122 lb 2.2 oz), SpO2 97.00%. The patient is alert and pleasant. He has some weakness in his hands without change from his preoperative status. He is moving all 4 extremities well. His dressing is clean and dry.  Disposition: Home     Medication List     As of 02/07/2012  7:59 AM    STOP taking these medications         HYDROcodone-acetaminophen 2.5-500 MG per tablet   Commonly known as: VICODIN      TAKE these medications         ALPRAZolam 0.25 MG tablet   Commonly known as: XANAX   Take 0.25 mg by mouth 5 (five) times daily.      cyanocobalamin 1000 MCG/ML injection   Commonly known as: (VITAMIN B-12)   Inject 1,000 mcg into the muscle every 30 (thirty) days.      DSS 100 MG Caps   Take 100 mg by mouth 2 (two) times daily.      gabapentin 600 MG tablet     Commonly known as: NEURONTIN   Take 600 mg by mouth 3 (three) times daily.      methyldopa-hydrochlorothiazide 250-25 MG per tablet   Commonly known as: ALDORIL-25   Take 1 tablet by mouth 2 (two) times daily.      oxyCODONE-acetaminophen 5-325 MG per tablet   Commonly known as: PERCOCET/ROXICET   Take 1-2 tablets by mouth every 4 (four) hours as needed.      SPIRIVA HANDIHALER 18 MCG inhalation capsule   Generic drug: tiotropium   Place 1 capsule into inhaler and inhale daily.      Tripelennamine HCl Powd   Take 25 mg by mouth 2 (two) times daily.      valsartan 80 MG tablet   Commonly known as: DIOVAN   Take 80 mg by mouth daily.         SignedCristi Loron 02/07/2012, 7:59 AM

## 2012-02-07 NOTE — Care Management Note (Signed)
    Page 1 of 1   02/12/2012     4:08:47 PM   CARE MANAGEMENT NOTE 02/12/2012  Patient:  Erik Atkins, Erik Atkins   Account Number:  [id number]  Date Initiated:  02/07/2012  Documentation initiated by:  Onnie Boer  Subjective/Objective Assessment:   PT WAS ADMITTED FOR AN ACDF     Action/Plan:   PROGRESSION OF CARE AND DISCHARGE PLANNING   Anticipated DC Date:  02/08/2012   Anticipated DC Plan:  HOME/SELF CARE      DC Planning Services  CM consult      Choice offered to / List presented to:             Status of service:  Completed, signed off Medicare Important Message given?   (If response is "NO", the following Medicare IM given date fields will be blank) Date Medicare IM given:   Date Additional Medicare IM given:    Discharge Disposition:  SKILLED NURSING FACILITY  Per UR Regulation:  Reviewed for med. necessity/level of care/duration of stay  If discussed at Long Length of Stay Meetings, dates discussed:    Comments:  02/11/12 Onnie Boer, RN, BSN [phone] PT IS AWAITING SNF BED.  02/07/12 Onnie Boer, RN, BSN [phone] PT WAS ADMITTED FOR ACDF.  PTA PT WAS AT HOME WITH SELF CARE.  PT WILL NEED OP PT AND Atkins SHOWER CHAIR.  FAMILY HAS REQUESTED SCRIPTS SO THAT THEY CAN TAKE THEM WHERE THEY CHOOSE.  WILL F/U ON DC NEEDS.

## 2012-02-07 NOTE — Progress Notes (Signed)
Clinical Social Worker received referral for new SNF placement. Clinical Social Worker reviewed chart and noted PT evaluation recommending pt needing Outpatient PT at discharge. No social work need identified at this time. CSW signing off. Please re-consult if social work needs arise.   Jacklynn Lewis, MSW, LCSWA  Clinical Social Work [phone]

## 2012-02-07 NOTE — Progress Notes (Signed)
Occupational Therapy Evaluation Patient Details Name: Erik Atkins MRN: [id number] DOB: [birth year] Today's Date: 02/07/2012 Time: 1030-1100 OT Time Calculation (min): 30 min  OT Assessment / Plan / Recommendation Clinical Impression  Pt s/p C3, C4 and partial C5 laminectomy with instrumentation and fusion at C3-4,4-5 thus affecting PLOF. Pt will benefit from acute OT services to address below problem list. Recommending OPOT further OT services.     OT Assessment  Patient needs continued OT Services    Follow Up Recommendations  Outpatient OT;Supervision/Assistance - 24 hour    Barriers to Discharge      Equipment Recommendations  Tub/shower seat (may need script for DME as he's unsure what will work at home; spoke with Case manager Victorino Dike about this)    Recommendations for Other Services    Frequency  Min 2X/week    Precautions / Restrictions Precautions Precautions: Fall;Cervical Precaution Comments: pt educated on cervical precautions Required Braces or Orthoses: Cervical Brace Cervical Brace: Hard collar;Applied in supine position Restrictions Weight Bearing Restrictions: No   Pertinent Vitals/Pain See vitals    ADL  Eating/Feeding: Performed;Set up Where Assessed - Eating/Feeding: Chair Lower Body Dressing: Performed;Modified independent Where Assessed - Lower Body Dressing: Unsupported sitting Toilet Transfer: Simulated;Min guard Toilet Transfer Method: Sit to Barista:  (chair) Equipment Used: Rolling walker (cervical collar) Transfers/Ambulation Related to ADLs: min guard with RW ADL Comments: Upon OT arrival, pt c/o cervical collar discomfort.  OT attempted adjusting collar since pt's chin kept tucking in and sliding below chin rest (pt's neck in somewhat flexed position already).  After repositioning, cervical collar continued to not seem to fit properly. Notified RN (who was at bedside), and she reported she would call [employer] to come  look at it.Pt is able to cross ankles over knees to don/doff socks. Discussed with pt using a shower chair in his walk in shower for increased steadiness and safety. Pt reports that his shower chair is too big for his shower. Suggested to pt using tub/shower in wife's bathroom, possibly with tub bench.  Pt very against idea of sharing a bathroom with his wife and reports he will not take a shower in her tub/shower.   Pt with difficulty reaching anteriorly for cup with his right hand. Reports he has having a difficult time eating due to collar.    OT Diagnosis: Generalized weakness;Acute pain  OT Problem List: Decreased strength;Decreased activity tolerance;Impaired balance (sitting and/or standing);Decreased coordination;Decreased knowledge of use of DME or AE;Decreased knowledge of precautions;Impaired UE functional use;Pain OT Treatment Interventions: Self-care/ADL training;Therapeutic exercise;DME and/or AE instruction;Therapeutic activities;Patient/family education;Balance training   OT Goals Acute Rehab OT Goals OT Goal Formulation: With patient Time For Goal Achievement: 02/14/12 Potential to Achieve Goals: Good ADL Goals Pt Will Perform Eating: with modified independence;Sitting, chair ADL Goal: Eating - Progress: Goal set today Pt Will Transfer to Toilet: with modified independence;Ambulation;with DME;Comfort height toilet ADL Goal: Toilet Transfer - Progress: Goal set today Pt Will Perform Tub/Shower Transfer: Shower transfer;with modified independence;Ambulation;with DME;Shower seat with back (will assist pt in determining proper DME) ADL Goal: Tub/Shower Transfer - Progress: Goal set today Miscellaneous OT Goals Miscellaneous OT Goal #1: Pt will independently perform HEP to strengthen bil shoulders in prep for ADLs. OT Goal: Miscellaneous Goal #1 - Progress: Goal set today Miscellaneous OT Goal #2: Pt will independently perform bil UE fine motor activity HEP. OT Goal: Miscellaneous  Goal #2 - Progress: Goal set today Miscellaneous OT Goal #3: Pt will be able to demonstrate  correct positioning of cervical collar caregiver (wife) independent in assisting. OT Goal: Miscellaneous Goal #3 - Progress: Goal set today  Visit Information  Last OT Received On: 02/07/12 Assistance Needed: +1    Subjective Data      Prior Functioning  Vision/Perception  Home Living Lives With: Spouse Available Help at Discharge: Family;Available 24 hours/day Type of Home: House Home Access: Stairs to enter [employer] of Steps: 3 Entrance Stairs-Rails: Left Home Layout: One level Bathroom Shower/Tub: Walk-in shower;Tub/shower unit Bathroom Toilet: Standard Bathroom Accessibility: Yes How Accessible: Accessible via walker Home Adaptive Equipment: Bedside commode/3-in-1;Shower chair with back Additional Comments: Pt uses bathroom with walk in shower, and wife uses bathroom with tub/shower. Prior Function Level of Independence: Independent Able to Take Stairs?: Yes Driving: Yes Vocation: Retired Musician: No difficulties Dominant Hand: Right      Cognition  Overall Cognitive Status: Appears within functional limits for tasks assessed/performed Arousal/Alertness: Awake/alert Orientation Level: Appears intact for tasks assessed Behavior During Session: [facility] for tasks performed    Extremity/Trunk Assessment Right Upper Extremity Assessment RUE ROM/Strength/Tone: Deficits RUE ROM/Strength/Tone Deficits: Shoulder flexion 3+/5. 4/5 throughout digits, wrist, elbow, and shoulder extension. RUE Sensation: Deficits RUE Sensation Deficits: Pt denied numbness or tingling but reports his fingers feeling "slippery." RUE Coordination: Deficits RUE Coordination Deficits: Requires increased time and needs to visualize fingers for increased accuracy. Left Upper Extremity Assessment LUE ROM/Strength/Tone: Deficits LUE ROM/Strength/Tone Deficits: 3+/5 shoulder  flexion. 4/5 throughout digits, wrist, elbow, and shoulder extension. LUE Sensation: Deficits LUE Sensation Deficits: Pt denied numbness or tingling but reports his fingers feeling "slippery." LUE Coordination: Deficits LUE Coordination Deficits: Requires increased time and needs to visualize fingers for increased accuracy. Right Lower Extremity Assessment RLE ROM/Strength/Tone: Deficits RLE ROM/Strength/Tone Deficits: Grossly 3+/5 Left Lower Extremity Assessment LLE ROM/Strength/Tone: Deficits LLE ROM/Strength/Tone Deficits: Grossly 3+/5   Mobility  Shoulder Instructions  Bed Mobility Bed Mobility: Not assessed Rolling Left: 4: Min assist;With rail Left Sidelying to Sit: 4: Min assist;HOB elevated;With rails Sitting - Scoot to Edge of Bed: 4: Min guard Details for Bed Mobility Assistance: VC for proper sequencing to maintain cervical precautions during transfer. Assist through trunk into sitting Transfers Transfers: Sit to Stand;Stand to Sit Sit to Stand: 4: Min guard;From chair/3-in-1;With armrests;With upper extremity assist Stand to Sit: 4: Min guard;To chair/3-in-1;With armrests;With upper extremity assist Details for Transfer Assistance: Min guard for steadying. VC for safest hand placement.       Exercise     Balance Balance Balance Assessed:  (throughout gait, poor balance)   End of Session OT - End of Session Equipment Utilized During Treatment: Cervical collar;Gait belt Activity Tolerance: Patient limited by pain;Patient limited by fatigue Patient left: with call bell/phone within reach;in chair;with nursing in room;with family/visitor present Nurse Communication: Mobility status (call [employer] about cervical collar)  GO    02/07/2012 Cipriano Mile OTR/L Pager [phone] Office [phone] Cipriano Mile 02/07/2012, 12:00 PM

## 2012-02-07 NOTE — Evaluation (Signed)
Physical Therapy Evaluation Patient Details Name: Erik Atkins MRN: [id number] DOB: [birth year] Today's Date: 02/07/2012 Time: 5784-6962 PT Time Calculation (min): 20 min  PT Assessment / Plan / Recommendation Clinical Impression  Pt s/p C3, C4 and partial C5 laminectomy with instrumentation and fusion at C3-4,4-5. All further needs can be met in outpatient PT for increased LE strength and balance for safety at home.     PT Assessment  Patent does not need any further PT services    Follow Up Recommendations  Outpatient PT;Supervision/Assistance - 24 hour    Barriers to Discharge        Equipment Recommendations  None recommended by PT    Recommendations for Other Services     Frequency Min 4X/week    Precautions / Restrictions Precautions Precautions: Fall;Cervical Precaution Comments: pt educated on cervical precautions Required Braces or Orthoses: Cervical Brace Cervical Brace: Hard collar;Applied in supine position Restrictions Weight Bearing Restrictions: No     Mobility  Bed Mobility Bed Mobility: Sitting - Scoot to Edge of Bed;Rolling Left;Left Sidelying to Sit Rolling Left: 4: Min assist;With rail Left Sidelying to Sit: 4: Min assist;HOB elevated;With rails Sitting - Scoot to Edge of Bed: 4: Min guard Details for Bed Mobility Assistance: VC for proper sequencing to maintain cervical precautions during transfer. Assist through trunk into sitting Transfers Transfers: Sit to Stand;Stand to Sit Sit to Stand: 4: Min guard;With upper extremity assist;From bed;From chair/3-in-1 Stand to Sit: 4: Min guard;With upper extremity assist;To chair/3-in-1;To bed Details for Transfer Assistance: VC for proper hand placement and safety to/from RW. Minguard for stability as pt with decreased strength Ambulation/Gait Ambulation/Gait Assistance: 4: Min guard Ambulation Distance (Feet): 75 Feet Assistive device: Rollator Ambulation/Gait Assistance Details: VC for proper  sequencing and safety with rollator. Pt with ataxic gait pattern. One episode of minimal knee buckling on the right, pt was able to maintain control through RW Gait Pattern: Ataxic;Narrow base of support;Trunk flexed;Decreased hip/knee flexion - right;Decreased hip/knee flexion - left;Decreased stride length;Step-to pattern Gait velocity: decreased gait speed Stairs: Yes Stairs Assistance: 4: Min assist Stairs Assistance Details (indicate cue type and reason): VC for sequencing. HHA on the right for control and stability. Stair Management Technique: One rail Left;Step to pattern;Forwards Number of Stairs: 3     Exercises     PT Diagnosis: Acute pain;Abnormality of gait  PT Problem List: Decreased activity tolerance;Decreased balance;Decreased strength;Decreased mobility;Decreased knowledge of use of DME;Decreased safety awareness;Decreased knowledge of precautions;Pain PT Treatment Interventions: DME instruction;Gait training;Stair training;Functional mobility training;Therapeutic activities;Balance training;Neuromuscular re-education;Patient/family education   PT Goals    Visit Information  Last PT Received On: 02/07/12 Assistance Needed: +1    Subjective Data      Prior Functioning  Home Living Lives With: Spouse Available Help at Discharge: Family;Available 24 hours/day Type of Home: House Home Access: Stairs to enter [employer] of Steps: 3 Entrance Stairs-Rails: Left Home Layout: One level Bathroom Shower/Tub: Walk-in Contractor: Standard Bathroom Accessibility: Yes How Accessible: Accessible via walker Home Adaptive Equipment: Other (comment) (rollator) Prior Function Level of Independence: Independent Able to Take Stairs?: Yes Driving: Yes Vocation: Retired Musician: No difficulties Dominant Hand: Right    Cognition  Overall Cognitive Status: Appears within functional limits for tasks  assessed/performed Arousal/Alertness: Awake/alert Orientation Level: Appears intact for tasks assessed Behavior During Session: [facility] for tasks performed    Extremity/Trunk Assessment Right Lower Extremity Assessment RLE ROM/Strength/Tone: Deficits RLE ROM/Strength/Tone Deficits: Grossly 3+/5 Left Lower Extremity Assessment LLE ROM/Strength/Tone: Deficits LLE ROM/Strength/Tone  Deficits: Grossly 3+/5   Balance Balance Balance Assessed:  (throughout gait, poor balance)  End of Session PT - End of Session Equipment Utilized During Treatment: Gait belt;Cervical collar Activity Tolerance: Patient tolerated treatment well Patient left: in chair;with call bell/phone within reach Nurse Communication: Mobility status   Milana Kidney 02/07/2012, 10:34 AM  02/07/2012 Milana Kidney DPT PAGER: [phone] OFFICE: [phone]

## 2012-02-07 NOTE — Progress Notes (Signed)
Orthopedic Tech Progress Note Patient Details:  Erik Atkins Apr 24, 1941 [id number]      [name] 02/07/2012, 12:58 PM CALLED BIO TECH FOR ASPEN COLLAR

## 2012-02-07 NOTE — Progress Notes (Signed)
02/07/12 0345- Pt attempted to voidx2 but was unable, In and out cath PRN as ordered done with no difficulty and drained of dark yellow urine. Will continue to monitor pt.

## 2012-02-08 ENCOUNTER — Inpatient Hospital Stay (HOSPITAL_COMMUNITY): Payer: Medicare Other

## 2012-02-08 MED ORDER — ALPRAZOLAM 0.25 MG PO TABS
0.2500 mg | ORAL_TABLET | Freq: Every evening | ORAL | Status: DC | PRN
  Administered 2012-02-09 – 2012-02-11 (×2): 0.25 mg via ORAL
  Filled 2012-02-08 (×2): qty 1

## 2012-02-08 MED ORDER — BOOST / RESOURCE BREEZE PO LIQD
1.0000 | Freq: Two times a day (BID) | ORAL | Status: DC
  Administered 2012-02-09 – 2012-02-11 (×6): 1 via ORAL

## 2012-02-08 NOTE — Progress Notes (Signed)
Patient ID: Erik Atkins, male   DOB: [birth year], 71 y.o.   MRN: [id number] Subjective:  The patient is somnolent but easily arousable.  Objective: Vital signs in last 24 hours: Temp:  [97.5 F (36.4 C)-99.4 F (37.4 C)] 97.6 F (36.4 C) (09/14 0615) Pulse Rate:  [70-79] 73  (09/14 0615) Resp:  [18-20] 18  (09/14 0615) BP: (105-149)/(45-65) 113/45 mmHg (09/14 0615) SpO2:  [94 %-97 %] 97 % (09/14 0615)  Intake/Output from previous day: 09/13 0701 - 09/14 0700 In: -  Out: 350 [Urine:350] Intake/Output this shift:    Physical exam the patient is somnolent but arousable. He is moving all 4 extremities well.  Lab Results: No results found for this basename: WBC:2,HGB:2,HCT:2,PLT:2 in the last 72 hours BMET No results found for this basename: NA:2,K:2,CL:2,CO2:2,GLUCOSE:2,BUN:2,CREATININE:2,CALCIUM:2 in the last 72 hours  Studies/Results: Dg Cervical Spine 1 View  02/06/2012  *RADIOLOGY REPORT*  Clinical Data: 71 year old male undergoing cervical spine surgery.  DG C-ARM 61-120 MIN, DG CERVICAL SPINE - 1 VIEW  Technique: Intraoperative lateral fluoroscopic view of the cervical spine.  Comparison:  Cervical MRI 12/27/2011.  Findings: This single intraoperative image demonstrates posterior laminar screw placement at the C3, C4, and probably also C5 levels.  IMPRESSION: Posterior laminar hardware placement as above.   Original Report Authenticated By: Harley Hallmark, M.D.    Dg C-arm 61-120 Min  02/06/2012  *RADIOLOGY REPORT*  Clinical Data: 71 year old male undergoing cervical spine surgery.  DG C-ARM 61-120 MIN, DG CERVICAL SPINE - 1 VIEW  Technique: Intraoperative lateral fluoroscopic view of the cervical spine.  Comparison:  Cervical MRI 12/27/2011.  Findings: This single intraoperative image demonstrates posterior laminar screw placement at the C3, C4, and probably also C5 levels.  IMPRESSION: Posterior laminar hardware placement as above.   Original Report Authenticated By: Harley Hallmark, M.D.     Assessment/Plan: Postop day #2: The patient will need a bit more PT. He will likely go home tomorrow or Monday.  Urinary retention: We will try to discontinue the patient's Foley catheter again tomorrow. We spoke with Dr. Ammie Ferrier office and they recommended the patient be started on Flomax and followup with him in the office if the urinary retention continues.  LOS: 2 days     Ajeenah Heiny D 02/08/2012, 9:07 AM

## 2012-02-08 NOTE — Evaluation (Signed)
Physical Therapy Re-Evaluation Patient Details Name: Erik Atkins MRN: [id number] DOB: 11/12/1940 Today's Date: 02/08/2012 Time: 5621-3086 PT Time Calculation (min): 26 min  PT Assessment / Plan / Recommendation Clinical Impression  Pt discharged from PT for no acute needs on 9/13. Pt currently presents with increased functional deficits, including unsafe gait, and generalized weakness. Pt will benefit from skilled PT in the acute care setting in order to increase functional mobility and safety.     PT Assessment  Patient needs continued PT services    Follow Up Recommendations  Inpatient Rehab;Supervision/Assistance - 24 hour    Barriers to Discharge        Equipment Recommendations  Defer to next venue    Recommendations for Other Services Rehab consult   Frequency Min 4X/week    Precautions / Restrictions Precautions Precautions: Fall;Cervical Precaution Comments: pt educated on cervical precautions Required Braces or Orthoses: Cervical Brace Cervical Brace: Hard collar;Applied in supine position Restrictions Weight Bearing Restrictions: No   Pertinent Vitals/Pain Neck pain with bed mobiltity, 6/10      Mobility  Bed Mobility Bed Mobility: Supine to Sit;Sitting - Scoot to Edge of Bed Supine to Sit: 2: Max assist Sitting - Scoot to [place] of Bed: 2: Max assist Details for Bed Mobility Assistance: Pt with strong Rt lean and unable to static sit without support . change in status Transfers Transfers: Sit to Stand;Stand to Sit Sit to Stand: 1: +2 Total assist;Without upper extremity assist;From bed Sit to Stand: Patient Percentage: 20% Stand to Sit: 1: +2 Total assist;To bed;Without upper extremity assist Stand to Sit: Patient Percentage: 20% Details for Transfer Assistance: poor UE use unable to lock rollator, unable to grasp rollator, hyper extending LE Ambulation/Gait Ambulation/Gait Assistance: 1: +2 Total assist Ambulation/Gait: Patient Percentage:  40% Ambulation Distance (Feet): 3 Feet Assistive device: Rollator Ambulation/Gait Assistance Details: unsafe to continue ambulation secondary to fall risk. Pt with ataxic gait and unable to control bilateral LEs.  Gait Pattern: Ataxic;Narrow base of support;Trunk flexed;Decreased hip/knee flexion - right;Decreased hip/knee flexion - left;Decreased stride length;Step-to pattern Gait velocity: decreased gait speed    Exercises     PT Diagnosis: Acute pain;Abnormality of gait  PT Problem List: Decreased activity tolerance;Decreased balance;Decreased strength;Decreased mobility;Decreased knowledge of use of DME;Decreased safety awareness;Decreased knowledge of precautions;Pain PT Treatment Interventions: DME instruction;Gait training;Stair training;Functional mobility training;Therapeutic activities;Balance training;Neuromuscular re-education;Patient/family education   PT Goals Acute Rehab PT Goals PT Goal Formulation: With patient Time For Goal Achievement: 02/22/12 Potential to Achieve Goals: Fair Pt will go Supine/Side to Sit: with supervision PT Goal: Supine/Side to Sit - Progress: Goal set today Pt will go Sit to Supine/Side: with supervision PT Goal: Sit to Supine/Side - Progress: Goal set today Pt will go Sit to Stand: with min assist PT Goal: Sit to Stand - Progress: Goal set today Pt will go Stand to Sit: with min assist PT Goal: Stand to Sit - Progress: Goal set today Pt will Transfer Bed to Chair/Chair to Bed: with min assist PT Transfer Goal: Bed to Chair/Chair to Bed - Progress: Goal set today Pt will Ambulate: >150 feet;with min assist;with least restrictive assistive device PT Goal: Ambulate - Progress: Goal set today  Visit Information  Last PT Received On: 02/08/12 Assistance Needed: +2 PT/OT Co-Evaluation/Treatment: Yes    Subjective Data      Prior Functioning  Home Living Lives With: Spouse Available Help at Discharge: Family;Available 24 hours/day Type of  Home: House Home Access: Stairs to enter [employer] of Steps:  3 Entrance Stairs-Rails: Left Home Layout: One level Bathroom Shower/Tub: Walk-in shower;Tub/shower unit Bathroom Toilet: Standard Bathroom Accessibility: Yes How Accessible: Accessible via walker Home Adaptive Equipment: Bedside commode/3-in-1;Shower chair with back Additional Comments: Pt uses bathroom with walk in shower, and wife uses bathroom with tub/shower. Prior Function Level of Independence: Independent Able to Take Stairs?: Yes Driving: Yes Vocation: Retired Musician: No difficulties Dominant Hand: Right    Cognition  Overall Cognitive Status: Appears within functional limits for tasks assessed/performed Arousal/Alertness: Awake/alert Orientation Level: Appears intact for tasks assessed Behavior During Session: [facility] for tasks performed    Extremity/Trunk Assessment Right Lower Extremity Assessment RLE ROM/Strength/Tone: Deficits RLE ROM/Strength/Tone Deficits: Grossly 3+/5 Left Lower Extremity Assessment LLE ROM/Strength/Tone: Deficits LLE ROM/Strength/Tone Deficits: Grossly 3+/5   Balance    End of Session PT - End of Session Equipment Utilized During Treatment: Gait belt;Cervical collar Activity Tolerance: Patient tolerated treatment well Patient left: in bed;with call bell/phone within reach;with family/visitor present Nurse Communication: Mobility status  GP     Milana Kidney 02/08/2012, 6:03 PM  02/08/2012 Milana Kidney DPT PAGER: [phone] OFFICE: [phone]

## 2012-02-08 NOTE — Progress Notes (Signed)
Occupational Therapy Treatment Patient Details Name: MASSIMO SHIPES MRN: [id number] DOB: December 24, 1940 Today's Date: 02/08/2012 Time: 4098-1191 OT Time Calculation (min): 23 min  OT Assessment / Plan / Recommendation Comments on Treatment Session Pt is not progressing and demonstrates incr weakness compared to evaluation    Follow Up Recommendations  Inpatient Rehab    Barriers to Discharge       Equipment Recommendations  Defer to next venue    Recommendations for Other Services Rehab consult  Frequency Min 2X/week   Plan Discharge plan needs to be updated    Precautions / Restrictions Precautions Precautions: Fall;Cervical Precaution Comments: pt educated on cervical precautions Required Braces or Orthoses: Cervical Brace Cervical Brace: Hard collar;Applied in supine position Restrictions Weight Bearing Restrictions: No   Pertinent Vitals/Pain Pain at surg site      OT Goals Acute Rehab OT Goals OT Goal Formulation: With patient Time For Goal Achievement: 02/14/12 Potential to Achieve Goals: Good ADL Goals Pt Will Perform Eating: with modified independence;Sitting, chair Pt Will Transfer to Toilet: with modified independence;Ambulation;with DME;Comfort height toilet ADL Goal: Toilet Transfer - Progress: Not progressing Pt Will Perform Tub/Shower Transfer: Shower transfer;with modified independence;Ambulation;with DME;Shower seat with back ADL Goal: Web designer - Progress: Not progressing Miscellaneous OT Goals Miscellaneous OT Goal #1: Pt will independently perform HEP to strengthen bil shoulders in prep for ADLs. OT Goal: Miscellaneous Goal #1 - Progress: Not progressing Miscellaneous OT Goal #2: Pt will independently perform bil UE fine motor activity HEP. OT Goal: Miscellaneous Goal #2 - Progress: Not progressing Miscellaneous OT Goal #3: Pt will be able to demonstrate correct positioning of cervical collar caregiver (wife) independent in assisting. OT  Goal: Miscellaneous Goal #3 - Progress: Not progressing  Visit Information  Last OT Received On: 02/08/12 Assistance Needed: +2 PT/OT Co-Evaluation/Treatment: Yes    Subjective Data      Prior Functioning       Cognition  Overall Cognitive Status: Appears within functional limits for tasks assessed/performed Arousal/Alertness: Awake/alert Orientation Level: Appears intact for tasks assessed Behavior During Session: [facility] for tasks performed    Mobility  Shoulder Instructions Bed Mobility Supine to Sit: 2: Max assist Sitting - Scoot to Edge of Bed: 2: Max assist Details for Bed Mobility Assistance: Pt with strong Rt lean and unable to static sit without support . change in status Transfers Sit to Stand: 1: +2 Total assist;Without upper extremity assist;From bed Sit to Stand: Patient Percentage: 20% Stand to Sit: 1: +2 Total assist;To bed;Without upper extremity assist Stand to Sit: Patient Percentage: 20% Details for Transfer Assistance: poor UE use unable to lock rollator, unable to grasp rollator, hyper extending LE       Exercises      Balance     End of Session OT - End of Session Equipment Utilized During Treatment: Cervical collar;Gait belt Activity Tolerance: Patient limited by pain;Patient limited by fatigue Patient left: in bed;with call bell/phone within reach Nurse Communication: Mobility status  GO     Harrel Carina [facility] 02/08/2012, 4:34 PM Pager: [phone]

## 2012-02-08 NOTE — Progress Notes (Signed)
INITIAL ADULT NUTRITION ASSESSMENT Date: 02/08/2012   Time: 4:03 PM Reason for Assessment: MST  ASSESSMENT: Male 71 y.o.  Dx: laminectomy with instrumentation and fusion  Hx:  Past Medical History  Diagnosis Date  . Hypertension   . Nerve disorder 1996    "Charot Marie"  . COPD (chronic obstructive pulmonary disease)   . Charcot Marie Tooth muscular atrophy   . Neuropathy   . Anxiety   . History of pneumonia    Past Surgical History  Procedure Date  . Tonsillectomy   . Mastectomy for gynecomastia 1979 and 1981  . Cataract extraction, bilateral   . Colonoscopy     Related Meds:  Scheduled Meds:   . cyanocobalamin  1,000 mcg Intramuscular Q30 days  . docusate sodium  100 mg Oral BID  . gabapentin  600 mg Oral TID  . methyldopa  250 mg Oral BID   And  . hydrochlorothiazide  25 mg Oral BID  . irbesartan  75 mg Oral Daily  . Tamsulosin HCl  0.4 mg Oral Daily  . tiotropium  1 capsule Inhalation Daily  . DISCONTD: ALPRAZolam  0.25 mg Oral 5 X Daily  . DISCONTD: Tripelennamine HCl  25 mg Oral BID   Continuous Infusions:   . lactated ringers 75 mL/hr at 02/06/12 2035   PRN Meds:.acetaminophen, acetaminophen, ALPRAZolam, HYDROcodone-acetaminophen, menthol-cetylpyridinium, morphine injection, ondansetron (ZOFRAN) IV, oxyCODONE-acetaminophen, phenol, DISCONTD: diazepam   Ht: 5\' 5"  (165.1 cm)  Wt: 122 lb 2.2 oz (55.4 kg)  Ideal Wt: 61.8 kg % Ideal Wt: 89.6%  Usual Wt: 137 lbs % Usual Wt: 89%  Body mass index is 20.32 kg/(m^2).  Food/Nutrition Related Hx: presents for elective surgery  Labs:  CMP     Component Value Date/Time   NA 126* 03/02/2009 1137   K 4.0 03/02/2009 1137   CL 88* 03/02/2009 1137   CO2 30 03/02/2009 1137   GLUCOSE 93 03/02/2009 1137   BUN 8 03/02/2009 1137   CREATININE 0.67 03/02/2009 1137   CALCIUM 8.9 03/02/2009 1137   GFRNONAA >60 03/02/2009 1137   GFRAA  Value: >60        The eGFR has been calculated using the MDRD equation. This  calculation has not been validated in all clinical situations. eGFR's persistently <60 mL/min signify possible Chronic Kidney Disease. 03/02/2009 1137    CBC    Component Value Date/Time   WBC 14.5* 06/03/2011 1534   RBC 4.58 06/03/2011 1534   HGB 16.1 06/03/2011 1534   HCT 46.3 06/03/2011 1534   PLT 341 06/03/2011 1534   MCV 101.1* 06/03/2011 1534   MCH 35.2* 06/03/2011 1534   MCHC 34.8 06/03/2011 1534   RDW 12.2 06/03/2011 1534   LYMPHSABS 1.9 06/03/2011 1534   MONOABS 1.3* 06/03/2011 1534   EOSABS 0.3 06/03/2011 1534   BASOSABS 0.0 06/03/2011 1534    Intake: 5-75 Output:   Intake/Output Summary (Last 24 hours) at 02/08/12 1609 Last data filed at 02/08/12 1100  Gross per 24 hour  Intake    560 ml  Output    700 ml  Net   -140 ml   Last BM unknown  Diet Order: Regular  Supplements/Tube Feeding: none at this time  IVF:    lactated ringers Last Rate: 75 mL/hr at 02/06/12 2035    Estimated Nutritional Needs:   Kcal: 0981-1914 Protein: 82-100g Fluid: ~1.8 L/day  Pt not available at time of visit- off floor for MRI. Wife at bedside is able to speak to nutrition status.  She reports pt eats very well, and per his usual however weight has decreased from 147 lbs in June 2012 to 122 lbs at present representing a 17% wt loss in >1 year.  Wife feels pt is wasted and states his muscle tone is very poor.  Pt has been on supplements in the past without effects to weight.  Pt usually gets a boost in appetite when taking steroids.  She reports wt has been stable for a few months. Pt expected to d/c yesterday, however weak today prompting MRI. Wife states pt is allergic tea, and would likely be willing to try [facility].  Unable to dx with malnutrition at this time although suspected.  Will monitor for pt's return to floor for physical assessment as able.  NUTRITION DIAGNOSIS: Unintended wt loss  RELATED TO: catabolic illness, inactivity  AS EVIDENCE BY: pt with 17% wt loss in 1  year.  MONITORING/EVALUATION(Goals): 1.  Food/Beverage; pt consuming >75% of meals, Breeze daily  EDUCATION NEEDS: -No education needs identified at this time  INTERVENTION: 1.  Supplements; Breeze BID between meals.   DOCUMENTATION CODES Per approved criteria  -Not Applicable   Loyce Dys, MS RD LDN Clinical Inpatient Dietitian Pager: [phone] Weekend/After hours pager: [phone]

## 2012-02-08 NOTE — Progress Notes (Signed)
Orthopedic Tech Progress Note Patient Details:  Erik Atkins [birth year] [id number]  Patient ID: [name], male   DOB: 1940-12-13, 71 y.o.   MRN: [id number] Confirmed with pt RN that pt has collar.    Leo Grosser T 02/08/2012, 11:37 AM

## 2012-02-08 NOTE — Progress Notes (Signed)
Noted that patient has been very hard to ambulate today; weakness and balance issues. Asked PT how patient had done yesterday in therapy and patient had been 1 assist, walked down the hall, and did stairs with PT and been signed off. Told PT that patient was weaker today, could they please re-evaluate. PT/OT just worked with patient and confirmed that patient is indeed much weaker than yesterday, a bit congested, having trouble with balance. Incision site dry, only minimal swelling. This was all relayed to the MD on call Jones who just ordered a stat MRI of cervical spine. Will await test and call MD when test is complete.  Minor, Yvette Rack

## 2012-02-09 NOTE — Progress Notes (Signed)
Physical Therapy Treatment Patient Details Name: Erik Atkins MRN: [id number] DOB: 1941-01-09 Today's Date: 02/09/2012 Time: 1478-2956 PT Time Calculation (min): 23 min  PT Assessment / Plan / Recommendation Comments on Treatment Session  Pt with drastic changes since yesterday's re-evaluation. Pt able to ambulate and complete all mobility with +1 assist. Will continue per plan for future sessions to evaluate safe discharge at home with wife    Follow Up Recommendations  Outpatient PT;Supervision/Assistance - 24 hour    Barriers to Discharge        Equipment Recommendations  Defer to next venue    Recommendations for Other Services Other (comment) (none)  Frequency Min 4X/week   Plan Discharge plan needs to be updated;Frequency remains appropriate    Precautions / Restrictions Precautions Precautions: Fall;Cervical Precaution Comments: pt educated on cervical precautions Required Braces or Orthoses: Cervical Brace Cervical Brace: Hard collar;Applied in supine position Restrictions Weight Bearing Restrictions: No   Pertinent Vitals/Pain Pt with 6-7/10 pain complaints in neck. RN aware.     Mobility  Bed Mobility Bed Mobility: Supine to Sit;Sitting - Scoot to Edge of Bed;Sit to Supine Supine to Sit: 5: Supervision Sitting - Scoot to Edge of Bed: 5: Supervision Sit to Supine: 5: Supervision Details for Bed Mobility Assistance: Cueing for proper sequencing. Pt with great improvements since yesterday Transfers Transfers: Sit to Stand;Stand to Sit Sit to Stand: 4: Min assist;With upper extremity assist;From bed Stand to Sit: 4: Min assist;With upper extremity assist;To bed Details for Transfer Assistance: Min assist for stability, pt with major improvements since sit/stand yesterday. Pt able to lock/unlock rollator with no problem.  Ambulation/Gait Ambulation/Gait Assistance: 4: Min assist Ambulation Distance (Feet): 150 Feet Assistive device: Rollator Ambulation/Gait  Assistance Details: Pt still with ataxic gait although no loss of balance or increased assistance needed for stability. Pt able to maintain heel/toe pattern. Gait resembles more of evaluation Friday.  Gait Pattern: Ataxic;Narrow base of support;Trunk flexed;Decreased hip/knee flexion - right;Decreased hip/knee flexion - left;Decreased stride length;Step-to pattern Gait velocity: decreased gait speed Stairs: No    Exercises     PT Diagnosis:    PT Problem List:   PT Treatment Interventions:     PT Goals Acute Rehab PT Goals Pt will go Supine/Side to Sit: with modified independence PT Goal: Supine/Side to Sit - Progress: Updated due to goal met Pt will go Sit to Supine/Side: with modified independence PT Goal: Sit to Supine/Side - Progress: Updated due to goal met Pt will go Sit to Stand: with modified independence PT Goal: Sit to Stand - Progress: Updated due to goal met Pt will go Stand to Sit: with modified independence PT Goal: Stand to Sit - Progress: Updated due to goals met Pt will Transfer Bed to Chair/Chair to Bed: with modified independence PT Transfer Goal: Bed to Chair/Chair to Bed - Progress: Updated due to goal met Pt will Ambulate: with modified independence PT Goal: Ambulate - Progress: Updated due to goal met  Visit Information  Last PT Received On: 02/09/12 Assistance Needed: +1    Subjective Data      Cognition  Overall Cognitive Status: Appears within functional limits for tasks assessed/performed Arousal/Alertness: Awake/alert Orientation Level: Appears intact for tasks assessed Behavior During Session: [facility] for tasks performed    Balance     End of Session PT - End of Session Equipment Utilized During Treatment: Gait belt;Cervical collar Activity Tolerance: Patient tolerated treatment well Patient left: in bed;with call bell/phone within reach;with family/visitor present Nurse Communication: Mobility  status   GP     Milana Kidney 02/09/2012,  12:21 PM

## 2012-02-09 NOTE — Progress Notes (Signed)
Patient ID: Erik Atkins, male   DOB: Sep 27, 1940, 71 y.o.   MRN: [id number] Pt looks better than yesterday. OOB to chair. Can now liift arms over head. Grips 4/5. In collar. MRI reviewed with jenkins. Doing better..The current medical regimen is effective;  continue present plan and medications. Therapy.

## 2012-02-09 NOTE — Progress Notes (Signed)
Occupational Therapy Treatment Patient Details Name: Erik Atkins MRN: [id number] DOB: Apr 01, 1941 Today's Date: 02/09/2012 Time: 4098-1191 OT Time Calculation (min): 61 min  OT Assessment / Plan / Recommendation Comments on Treatment Session Pt. much improved since yesterday, but does not appear to be at evaluation level just yet.  Pt. will need further education/practice with ADLs, and would benefit from light theraband exercises for dysmetria and coordination HEP.  Wife would benefit from further practice assisting pt. with collar    Follow Up Recommendations  Outpatient OT;Supervision/Assistance - 24 hour    Barriers to Discharge       Equipment Recommendations  Tub/shower seat    Recommendations for Other Services    Frequency Min 2X/week   Plan Discharge plan needs to be updated    Precautions / Restrictions Precautions Precautions: Fall;Cervical Precaution Comments: pt educated on cervical precautions Required Braces or Orthoses: Cervical Brace Cervical Brace: Hard collar;Applied in supine position Restrictions Weight Bearing Restrictions: No   Pertinent Vitals/Pain     ADL  Lower Body Dressing: Performed;Moderate assistance (for socks) Where Assessed - Lower Body Dressing: Unsupported sitting (Pt. falls to Rt) Toilet Transfer: Simulated;Minimal assistance Toilet Transfer Method: Sit to stand Toilet Transfer Equipment: Comfort height toilet Equipment Used: Rolling walker (cervical collar) Transfers/Ambulation Related to ADLs: min A with RW ADL Comments: Pt. very irritable due to wanting a cigarette.  Pt. instructed in the need to consider cessation, but pt. reports he has no plans to do so.  Pt. with dysmetria bil. UEs with impaired sensation bil hands.  Long discussion with pt and wife re: the real possibillity that pt. is at risk for burning himself, the collar, or something else if he smokes.  Pt's wife reports he has electronic cigarettes that he may use, and also  instructed pt and wife to purchase a cigarette holder if he insists on smoking regular cigarettes to reduce the risk of burn.   They both verbalized understanding.  Pt. lost balance both to the rt and Lt. when attempting to don socks EOB, but is able to recover on his own.  He required mod A to don socks and insists his socks at home will be easier because they are longer (??).  Pt and wife were verbally instructed in cervical precautions, and they report that MD informed him that he doesn't need to wear the collar all the time.  Instructed them both to clarify this info. with MD, and note left for MD to please clarify.  Pt's wife was instructed how to assist pt. with donning/doffing collar and with change of pads.  She returned demonstration with min A.  Instructed them on grooming with the brace off, and reinforced no neck mobility while the brace is off, or while performing grooming.  They verbalized understanding.    OT Diagnosis:    OT Problem List:   OT Treatment Interventions:     OT Goals Acute Rehab OT Goals OT Goal Formulation: With patient Time For Goal Achievement: 02/14/12 Potential to Achieve Goals: Good ADL Goals Pt Will Perform Grooming: Standing at sink;with min assist ADL Goal: Grooming - Progress: Goal set today Pt Will Perform Lower Body Bathing: Independently;with min assist;Sit to stand from chair;Sit to stand from bed Pt Will Perform Upper Body Dressing: with min assist;Sitting, chair;Sitting, bed ADL Goal: Upper Body Dressing - Progress: Goal set today Pt Will Perform Lower Body Dressing: with supervision;Sit to stand from chair;Sit to stand from bed ADL Goal: Lower Body Dressing - Progress:  Goal set today ADL Goal: Toilet Transfer - Progress: Progressing toward goals Miscellaneous OT Goals OT Goal: Miscellaneous Goal #3 - Progress: Progressing toward goals  Visit Information  Last OT Received On: 02/09/12 Assistance Needed: +1 PT/OT Co-Evaluation/Treatment: Yes      Subjective Data      Prior Functioning       Cognition  Overall Cognitive Status: Appears within functional limits for tasks assessed/performed Arousal/Alertness: Awake/alert Orientation Level: Appears intact for tasks assessed Behavior During Session: Other (comment) (irritable - wants to go home and smoke)    Mobility  Shoulder Instructions Bed Mobility Bed Mobility: Supine to Sit;Sitting - Scoot to Edge of Bed;Sit to Supine Supine to Sit: 5: Supervision Sitting - Scoot to Edge of Bed: 5: Supervision Sit to Supine: 5: Supervision Details for Bed Mobility Assistance: Cueing for proper sequencing. Pt with great improvements since yesterday Transfers Transfers: Sit to Stand;Stand to Sit Sit to Stand: 4: Min assist;With upper extremity assist;From bed Stand to Sit: 4: Min assist;With upper extremity assist;To bed Details for Transfer Assistance: Min assist for stability, pt with major improvements since sit/stand yesterday. Pt able to lock/unlock rollator with no problem.  (cues for hand placement)       Exercises      Balance Balance Balance Assessed: Yes Dynamic Sitting Balance Dynamic Sitting - Level of Assistance: 5: Stand by assistance (loses balance to Lt and Rt)   End of Session OT - End of Session Equipment Utilized During Treatment: Cervical collar Activity Tolerance: Patient tolerated treatment well Patient left: in bed;with call bell/phone within reach;with family/visitor present Nurse Communication: Mobility status  GO     Dameshia Seybold M 02/09/2012, 12:51 PM

## 2012-02-10 ENCOUNTER — Encounter (HOSPITAL_COMMUNITY): Payer: Self-pay | Admitting: Neurosurgery

## 2012-02-10 NOTE — Progress Notes (Signed)
I have read this note and agree with the below change in d/c plan.  Ivonne Andrew PT, DPT Pager: [phone]

## 2012-02-10 NOTE — Progress Notes (Signed)
Occupational Therapy Treatment Patient Details Name: Erik Atkins MRN: [id number] DOB: 1941/01/14 Today's Date: 02/10/2012 Time: 9562-1308 OT Time Calculation (min): 30 min  OT Assessment / Plan / Recommendation Comments on Treatment Session will benefit from further OT for education and HEP          Equipment Recommendations  Tub/shower seat       Frequency Min 2X/week   Plan Discharge plan remains appropriate    Precautions / Restrictions Precautions Precautions: Fall;Cervical Required Braces or Orthoses: Cervical Brace Cervical Brace: Hard collar;Applied in supine position       ADL  ADL Comments: Pt agreed bed to chair.  Pt then complained of pain, RN called. Pt complaining of weakness is his hands . OT suggested pt work with theraputty. Pt agreed. OT provided pt with yellow and tan theraputty and instructed on use. Pt will need reinforcement.  Pt in too much pain to continue. RN giving pain meds.       OT Goals Miscellaneous OT Goals OT Goal: Miscellaneous Goal #2 - Progress: Progressing toward goals  Visit Information  Last OT Received On: 02/10/12 Assistance Needed: +1    Subjective Data  Subjective: I am in pain- the left side of my neck. can you get my something for pain?  ( RN called )      Cognition  Overall Cognitive Status: Impaired Area of Impairment: Attention;Following commands;Safety/judgement;Awareness of deficits Arousal/Alertness: Awake/alert Orientation Level: Appears intact for tasks assessed Behavior During Session: [facility] for tasks performed Current Attention Level: Sustained Following Commands: Follows one step commands with increased time Safety/Judgement: Decreased awareness of safety precautions;Decreased awareness of need for assistance Safety/Judgement - Other Comments: Patient slightly impulsvie this session.  Awareness of Deficits: Patient unaware of his positioning with ambulation as he was flex so far forward he was not aware of  losing his balance and need for assistance to have him stop, rest and prevent fall    Mobility  Shoulder Instructions Bed Mobility Bed Mobility: Supine to Sit Rolling Left: 3: Mod assist Supine to Sit: With rails;4: Min assist Sitting - Scoot to Edge of Bed: With rail;4: Min assist Sit to Supine: 4: Min assist Details for Bed Mobility Assistance: A for legs back into bed. Cues for safe positioning Transfers Transfers: Sit to Stand;Stand to Sit Sit to Stand: 4: Min assist;With upper extremity assist;From bed Stand to Sit: With upper extremity assist;4: Min assist;To chair/3-in-1 Details for Transfer Assistance: Cues for safe hand placement and A for balance and positioning prior to sitting.              End of Session OT - End of Session Activity Tolerance: Patient limited by pain Patient left: in bed Nurse Communication: Patient requests pain meds  GO     Haleigh Desmith, Metro Kung 02/10/2012, 11:47 AM

## 2012-02-10 NOTE — Progress Notes (Signed)
Patient ID: Erik Atkins, male   DOB: [birth year], 71 y.o.   MRN: [id number] Subjective:  The patient is alert and pleasant. He wants to go home. He is still not mobilizing very well.  Objective: Vital signs in last 24 hours: Temp:  [97.6 F (36.4 C)-98.1 F (36.7 C)] 97.9 F (36.6 C) (09/16 1040) Pulse Rate:  [59-106] 59  (09/16 1040) Resp:  [18] 18  (09/16 1040) BP: (108-132)/(41-47) 125/43 mmHg (09/16 1040) SpO2:  [95 %-98 %] 96 % (09/16 1040)  Intake/Output from previous day: 09/15 0701 - 09/16 0700 In: 270 [P.O.:270] Out: 670 [Urine:670] Intake/Output this shift:    Physical exam the patient is alert and oriented. His strength is grossly normal except his handgrips are slightly weak. His strength has improved since surgery.  Lab Results: No results found for this basename: WBC:2,HGB:2,HCT:2,PLT:2 in the last 72 hours BMET No results found for this basename: NA:2,K:2,CL:2,CO2:2,GLUCOSE:2,BUN:2,CREATININE:2,CALCIUM:2 in the last 72 hours  Studies/Results: Mr Cervical Spine Wo Contrast  02/08/2012  *RADIOLOGY REPORT*  Clinical Data: Cervical fusion 2 days ago.  Pain and weakness.  MRI CERVICAL SPINE WITHOUT CONTRAST  Technique:  Multiplanar and multiecho pulse sequences of the cervical spine, to include the craniocervical junction and cervicothoracic junction, were obtained according to standard protocol without intravenous contrast.  Comparison: Operative radiographs 02/06/2012.  Preoperative MRI 12/27/2011.  Findings: The foramen magnum remains widely patent.  No significant finding at C1 or C2.  There has been posterior decompression of C3 and C4 with placement of the lateral mass screws at C3, C4 and C5 with posterior rods. There is no evidence of epidural hematoma.  There is fluid and a small amount of air at the site of posterior decompression.  The surgery appears to have relieved the stenoses at C3-4 and C4-5. Some abnormal T2 signal in the cord at the C3-4 level is again  noted, present preoperatively.  There is a small amount of fluid along the dorsal epidural space at C5 and C6, but this would not seem to be of likely significance.  Mild spondylosis at C5-6, C6-7 and C7-T1 appears the same.  IMPRESSION: Posterior decompression at C3-4 and C4-5.  Posterior fusion from C3- C5.  Relief of the stenoses at C3-4 and C4-5.  Low-level abnormal signal in the cord at C3-4 was present preoperatively.  Fluid in the operative bed as expected.  Small amount of fluid tracking in the dorsal epidural space behind C5 and C6, unlikely to be significant/compressive.   Original Report Authenticated By: Thomasenia Sales, M.D.     Assessment/Plan: Postop day #4: Looks like the patient will need skilled nursing facility placement for further rehabilitation. At the patient's request we'll attempt to arrange for this in [place].  Urinary retention: The patient will be discharged with Foley catheter and followup with Dr.Ottlin.  LOS: 4 days     Judge Duque D 02/10/2012, 2:03 PM

## 2012-02-10 NOTE — Progress Notes (Signed)
Orthopedic Tech Progress Note Patient Details:  Erik Atkins 1940/09/17 [id number]  Patient ID: Erik Atkins, male   DOB: 1941-05-01, 71 y.o.   MRN: [id number]   Erik Atkins 02/10/2012, 9:23 AM ASPEN COLLAR WITH LINERS COMPLETED BY [facility]

## 2012-02-10 NOTE — Progress Notes (Addendum)
Physical Therapy Treatment Patient Details Name: Erik Atkins MRN: [id number] DOB: [birth year] Today's Date: 02/10/2012 Time: 7829-5621 PT Time Calculation (min): 16 min  PT Assessment / Plan / Recommendation Comments on Treatment Session  Patient still unsafe with mobilty although he has made improvements. Patient seemed unaware of his deficits and need for assistance this session. I believe patient would benefit from HHPT prior to starting OPPT. Will continue to follow    Follow Up Recommendations  Home health PT;Supervision/Assistance - 24 hour    Barriers to Discharge        Equipment Recommendations  Tub/shower seat    Recommendations for Other Services    Frequency Min 4X/week   Plan Discharge plan needs to be updated;Frequency remains appropriate    Precautions / Restrictions Precautions Precautions: Fall;Cervical Required Braces or Orthoses: Cervical Brace Cervical Brace: Hard collar;Applied in supine position   Pertinent Vitals/Pain     Mobility  Bed Mobility Supine to Sit: With rails;4: Min guard Sitting - Scoot to [place] of Bed: With rail;4: Min guard Sit to Supine: 4: Min assist Details for Bed Mobility Assistance: A for legs back into bed. Cues for safe positioning Transfers Sit to Stand: 4: Min assist;With upper extremity assist;From bed Stand to Sit: To bed;With upper extremity assist;4: Min assist Details for Transfer Assistance: Cues for safe hand placement and A for balance and positioning prior to sitting.  Ambulation/Gait Ambulation/Gait Assistance: 3: Mod assist Ambulation Distance (Feet): 200 Feet Assistive device: Rollator Ambulation/Gait Assistance Details: Patient with ataxic gait and dragging his feet stating " I keep getting stuck on the floor with these socks" Patient with very kyphotic posture at one point patient pushing rollator but not moving LEs throwing himslef off balance.  Gait Pattern: Antalgic;Narrow base of support;Trunk  flexed;Decreased hip/knee flexion - left;Decreased hip/knee flexion - right    Exercises     PT Diagnosis:    PT Problem List:   PT Treatment Interventions:     PT Goals Acute Rehab PT Goals PT Goal: Supine/Side to Sit - Progress: Progressing toward goal PT Goal: Sit to Supine/Side - Progress: Progressing toward goal PT Goal: Sit to Stand - Progress: Progressing toward goal PT Goal: Stand to Sit - Progress: Progressing toward goal PT Transfer Goal: Bed to Chair/Chair to Bed - Progress: Progressing toward goal PT Goal: Ambulate - Progress: Progressing toward goal  Visit Information  Last PT Received On: 02/10/12 Assistance Needed: +1    Subjective Data  Subjective: I dont' know if I use to walk this way or not.    Cognition  Overall Cognitive Status: Impaired Area of Impairment: Attention;Following commands;Safety/judgement;Awareness of deficits Arousal/Alertness: Awake/alert Orientation Level: Appears intact for tasks assessed Behavior During Session: [facility] for tasks performed Current Attention Level: Sustained Following Commands: Follows one step commands with increased time Safety/Judgement: Decreased awareness of safety precautions;Decreased awareness of need for assistance Safety/Judgement - Other Comments: Patient slightly impulsvie this session.  Awareness of Deficits: Patient unaware of his positioning with ambulation as he was flex so far forward he was not aware of losing his balance and need for assistance to have him stop, rest and prevent fall    Balance     End of Session PT - End of Session Equipment Utilized During Treatment: Gait belt;Cervical collar Activity Tolerance: Patient limited by fatigue Patient left: in bed;with call bell/phone within reach Nurse Communication: Mobility status   GP     Fredrich Birks 02/10/2012, 11:26 AM 02/10/2012 Fredrich Birks PTA [phone]  pager [phone] office

## 2012-02-11 MED ORDER — SODIUM CHLORIDE 0.9 % IV BOLUS (SEPSIS)
500.0000 mL | Freq: Once | INTRAVENOUS | Status: AC
  Administered 2012-02-11: 500 mL via INTRAVENOUS

## 2012-02-11 MED ORDER — OXYCODONE-ACETAMINOPHEN 10-325 MG PO TABS: 1.0000 | ORAL_TABLET | ORAL | Status: DC | PRN

## 2012-02-11 NOTE — Progress Notes (Signed)
Occupational Therapy Treatment Patient Details Name: Erik Atkins MRN: [id number] DOB: 05-Apr-1941 Today's Date: 02/11/2012 Time: 6295-2841 OT Time Calculation (min): 29 min  OT Assessment / Plan / Recommendation    Follow Up Recommendations  Skilled nursing facility       Equipment Recommendations  Defer to next venue       Frequency Min 2X/week   Plan Discharge plan needs to be updated    Precautions / Restrictions Precautions Precautions: Fall;Cervical Required Braces or Orthoses: Cervical Brace Cervical Brace: Hard collar;Applied in supine position;Other (comment) (educated wife in changing pads on collar, as well as donning)       ADL  Eating/Feeding: Performed;Minimal assistance Where Assessed - Eating/Feeding: Chair;Other (comment) (instructed pt to eat all meals out of bed and self feed) Where Assessed - Grooming: Unsupported sitting Upper Body Bathing: Minimal assistance;Other (comment) (pt had touble managing wash cloth) Toilet Transfer: Performed;Other (comment);Moderate assistance (performed bed to chair transfer with rollater.) Toilet Transfer Method: Sit to stand;Stand pivot;Other (comment) (pt leaning posterior. needed verbal cues to self correct) Transfers/Ambulation Related to ADLs: After pt getting to chair, OT session focused on use BUE for self feeding, grooming, and using theraputty for bilateral hand activity.  Instructed pt to make a snake with putty , as well as a ball. Both were challenging for patient. ADL Comments: Pt has agreed to to get therapy in post acute setting. Pt aware wife will not able to handle him at home.  Family and pt interested in [facility]      OT Goals ADL Goals ADL Goal: Eating - Progress: Other (comment) (pt min A with self feeding this session) ADL Goal: Grooming - Progress: Progressing toward goals ADL Goal: Toilet Transfer - Progress: Other (comment) (min- mod A this visit) Miscellaneous OT Goals OT Goal:  Miscellaneous Goal #2 - Progress: Progressing toward goals OT Goal: Miscellaneous Goal #3 - Progress: Progressing toward goals  Visit Information  Last OT Received On: 02/11/12    Subjective Data  Subjective: i have decided to go to the nursing home for rehab. my wife cant handle me like this. i am scared i am not going to get better      Cognition  Overall Cognitive Status: Appears within functional limits for tasks assessed/performed Arousal/Alertness: Awake/alert Orientation Level: Appears intact for tasks assessed Behavior During Session: [facility] for tasks performed Current Attention Level: Focused Attention - Other Comments: Pt did well with session with safety and following directions Safety/Judgement - Other Comments: Pt has awareness of deficits today and realizes he needs increased assist    Mobility  Shoulder Instructions Bed Mobility Supine to Sit: 3: Mod assist Sitting - Scoot to Edge of Bed: With rail Transfers Transfers: Sit to Stand;Stand to Sit Sit to Stand: 3: Mod assist;With upper extremity assist;From bed Stand to Sit: 3: Mod assist;To chair/3-in-1;With upper extremity assist             End of Session OT - End of Session Equipment Utilized During Treatment: Cervical collar Activity Tolerance: Patient tolerated treatment well Patient left: in chair;with call bell/phone within reach;with family/visitor present  GO     Beckem Tomberlin, [place] Kung 02/11/2012, 10:54 AM

## 2012-02-11 NOTE — Progress Notes (Signed)
I have read and agree with the below updated d/c plan. Ivonne Andrew PT, DPT Pager: [phone]

## 2012-02-11 NOTE — Clinical Social Work Psychosocial (Signed)
     Clinical Social Work Department BRIEF PSYCHOSOCIAL ASSESSMENT 02/11/2012  Patient:  Erik Atkins, Erik Atkins     Account Number:  [id number]     Admit date:  02/06/2012  Clinical Social Worker:  Peggyann Shoals  Date/Time:  02/11/2012 01:36 PM  Referred by:  Care Management  Date Referred:  02/10/2012 Referred for  SNF Placement   Other Referral:   Interview type:  Family Other interview type:    PSYCHOSOCIAL DATA Living Status:  WIFE Admitted from facility:   Level of care:   Primary support name:  Erik Atkins Primary support relationship to patient:  SPOUSE Degree of support available:   supportive    CURRENT CONCERNS Current Concerns  Post-Acute Placement   Other Concerns:    SOCIAL WORK ASSESSMENT / PLAN CSW met with pt and wife to address consult for SNF. PT is recommending SNF at discharge. CSW introduced herself and explained role and explained process of SNF placement.    Pt lives at home with wife in [place]. Pt's wife is unable to provide adequate support at home at this time. Pt and wife are interested in [facility].    CSW will initiate SNF referral for [facility] and follow up with bed offers. CSW will call [facility] directly to inquire about bed availability.   Assessment/plan status:  Psychosocial Support/Ongoing Assessment of Needs Other assessment/ plan:   Information/referral to community resources:   SNF List    PATIENTS/FAMILYS RESPONSE TO PLAN OF CARE: Pt was alert and oriented. Pt and wife are agreeable to discharge plan to SNF.

## 2012-02-11 NOTE — Clinical Social Work Placement (Addendum)
    Clinical Social Work Department CLINICAL SOCIAL WORK PLACEMENT NOTE 02/11/2012  Patient:  Erik Atkins, Erik Atkins  Account Number:  [id number] Admit date:  02/06/2012  Clinical Social Worker:  Peggyann Shoals  Date/time:  02/11/2012 02:33 PM  Clinical Social Work is seeking post-discharge placement for this patient at the following level of care:   SKILLED NURSING   (*CSW will update this form in Epic as items are completed)   02/11/2012  Patient/family provided with [facility] Department of Clinical Social Work's list of facilities offering this level of care within the geographic area requested by the patient (or if unable, by the patient's family).  02/11/2012  Patient/family informed of their freedom to choose among providers that offer the needed level of care, that participate in Medicare, Medicaid or managed care program needed by the patient, have an available bed and are willing to accept the patient.  02/11/2012  Patient/family informed of [facility]' ownership interest in [facility], as well as of the fact that they are under no obligation to receive care at this facility.  PASARR submitted to EDS on 02/11/2012 PASARR number received from EDS on 02/12/2012  FL2 transmitted to all facilities in geographic area requested by pt/family on  02/11/2012 FL2 transmitted to all facilities within larger geographic area on   Patient informed that his/her managed care company has contracts with or will negotiate with  certain facilities, including the following:     Patient/family informed of bed offers received:  02/12/2012 Patient chooses bed at [facility] Physician recommends and patient chooses bed at  SNF  Patient to be transferred to [facility] on 02/12/2012 Patient to be transferred to facility by pt's wife (MD is agreeable).   The following physician request were entered in Epic:   Additional Comments:

## 2012-02-11 NOTE — Progress Notes (Signed)
Physical Therapy Treatment Patient Details Name: Erik Atkins MRN: [id number] DOB: April 05, 1941 Today's Date: 02/11/2012 Time: 4098-1191 PT Time Calculation (min): 21 min  PT Assessment / Plan / Recommendation Comments on Treatment Session  Patient had just gotten out of bed with OT but agreeable to ambulation. Patient used RW this session with improved gait. Patient and his wife have discussed with the MD that they would like to DC to SNF as wife is not going to be able to take care of him at home as need. Agreed with that plan and will update accordingly    Follow Up Recommendations  Skilled nursing facility    Barriers to Discharge        Equipment Recommendations  Defer to next venue    Recommendations for Other Services    Frequency Min 4X/week   Plan Discharge plan needs to be updated;Frequency remains appropriate    Precautions / Restrictions Precautions Precautions: Cervical Precaution Comments: Patient reeducated on all precautions Required Braces or Orthoses: Cervical Brace Cervical Brace: Hard collar;Applied in supine position   Pertinent Vitals/Pain     Mobility  Bed Mobility Bed Mobility: Not assessed Supine to Sit: 3: Mod assist Sitting - Scoot to Edge of Bed: With rail Transfers Sit to Stand: 4: Min assist;With upper extremity assist;With armrests;From chair/3-in-1 Stand to Sit: 4: Min assist;With upper extremity assist;With armrests;To chair/3-in-1 Details for Transfer Assistance: A to initiate stand and ensure balance. Cues for safest technique Ambulation/Gait Ambulation/Gait Assistance: 4: Min assist Ambulation Distance (Feet): 200 Feet Assistive device: Rolling walker Ambulation/Gait Assistance Details: Had patient use RW this session instead of rollator. Patient able to maintain better balance and upright posture. No LOB this session  Gait Pattern: Step-through pattern;Decreased stride length;Trunk flexed Gait velocity: decreased    Exercises      PT Diagnosis:    PT Problem List:   PT Treatment Interventions:     PT Goals Acute Rehab PT Goals PT Goal: Sit to Stand - Progress: Progressing toward goal PT Goal: Stand to Sit - Progress: Progressing toward goal PT Transfer Goal: Bed to Chair/Chair to Bed - Progress: Progressing toward goal PT Goal: Ambulate - Progress: Progressing toward goal  Visit Information  Last PT Received On: 02/11/12 Assistance Needed: +1    Subjective Data      Cognition  Overall Cognitive Status: Appears within functional limits for tasks assessed/performed Arousal/Alertness: Awake/alert Orientation Level: Appears intact for tasks assessed Behavior During Session: [facility] for tasks performed Current Attention Level: Focused Attention - Other Comments: Pt did well with session with safety and following directions Safety/Judgement - Other Comments: Pt has awareness of deficits today and realizes he needs increased assist    Balance     End of Session PT - End of Session Equipment Utilized During Treatment: Gait belt;Cervical collar Activity Tolerance: Patient tolerated treatment well Patient left: in chair;with call bell/phone within reach Nurse Communication: Mobility status   GP     Fredrich Birks 02/11/2012, 11:59 AM 02/11/2012 Fredrich Birks PTA [phone] pager [phone] office

## 2012-02-11 NOTE — Discharge Summary (Signed)
Physician Discharge Summary  Patient ID: Erik Atkins MRN: [id number] DOB/AGE: [birth year] 72 y.o.  Admit date: 02/06/2012 Discharge date: 02/11/2012  Admission Diagnoses: Cervical myelopathy, spinal stenosis  Discharge Diagnoses: The same Active Problems:  * No active hospital problems. *    Discharged Condition: good  Hospital Course: I admitted the patient to [facility] 02/06/2012. On that day I performed a C3 and C4 laminectomy with a C3-4 and C4-5 posterior instrumentation and fusion. The surgery went well.  The patient's postoperative course was remarkable only for some weakness. We worked this up with a postoperative cervical MRI which demonstrated no significant neural compression. We had PT and OT see the patient. His strength is about the same as his preoperative status. I discussed skilled nursing facility/further rehabilitation with the patient. He has agreed to placement. We are awaiting a bed.  Consults: None Significant Diagnostic Studies: Cervical MRI Treatments: C3-4 and C4-5 decompression, instrumentation, and fusion. Discharge Exam: Blood pressure 99/49, pulse 57, temperature 97.7 F (36.5 C), temperature source Oral, resp. rate 18, height 5\' 5"  (1.651 m), weight 55.4 kg (122 lb 2.2 oz), SpO2 97.00%. The patient is alert and oriented. His wound is healing well. He is moving all 4 extremities well.  Disposition: Skilled nursing facility  Discharge Orders    Future Orders Please Complete By Expires   Diet - low sodium heart healthy      Increase activity slowly      Discharge instructions      Comments:   Call [phone] for a followup appointment.   No dressing needed      Call MD for:  temperature >100.4      Call MD for:  persistant nausea and vomiting      Call MD for:  severe uncontrolled pain      Call MD for:  redness, tenderness, or signs of infection (pain, swelling, redness, odor or green/yellow discharge around incision site)      Call  MD for:  difficulty breathing, headache or visual disturbances      Call MD for:  hives      Call MD for:  persistant dizziness or light-headedness      Call MD for:  extreme fatigue          Medication List     As of 02/11/2012  7:54 AM    STOP taking these medications         HYDROcodone-acetaminophen 2.5-500 MG per tablet   Commonly known as: VICODIN      TAKE these medications         ALPRAZolam 0.25 MG tablet   Commonly known as: XANAX   Take 0.25 mg by mouth 5 (five) times daily.      cyanocobalamin 1000 MCG/ML injection   Commonly known as: (VITAMIN B-12)   Inject 1,000 mcg into the muscle every 30 (thirty) days.      DSS 100 MG Caps   Take 100 mg by mouth 2 (two) times daily.      gabapentin 600 MG tablet   Commonly known as: NEURONTIN   Take 600 mg by mouth 3 (three) times daily.      methyldopa-hydrochlorothiazide 250-25 MG per tablet   Commonly known as: ALDORIL-25   Take 1 tablet by mouth 2 (two) times daily.      oxyCODONE-acetaminophen 5-325 MG per tablet   Commonly known as: PERCOCET/ROXICET   Take 1-2 tablets by mouth every 4 (four) hours as needed.  oxyCODONE-acetaminophen 10-325 MG per tablet   Commonly known as: PERCOCET   Take 1 tablet by mouth every 4 (four) hours as needed for pain.      SPIRIVA HANDIHALER 18 MCG inhalation capsule   Generic drug: tiotropium   Place 1 capsule into inhaler and inhale daily.      Tamsulosin HCl 0.4 MG Caps   Commonly known as: FLOMAX   Take 1 capsule (0.4 mg total) by mouth daily.      Tripelennamine HCl Powd   Take 25 mg by mouth 2 (two) times daily.      valsartan 80 MG tablet   Commonly known as: DIOVAN   Take 80 mg by mouth daily.         SignedCristi Loron 02/11/2012, 7:54 AM

## 2012-02-12 MED ORDER — POLYETHYLENE GLYCOL 3350 17 G PO PACK
17.0000 g | PACK | Freq: Every day | ORAL | Status: DC
  Administered 2012-02-12: 17 g via ORAL
  Filled 2012-02-12: qty 1

## 2012-02-12 MED ORDER — BISACODYL 10 MG RE SUPP: 10.0000 mg | Freq: Every day | RECTAL | Status: DC | PRN

## 2012-02-12 NOTE — Progress Notes (Signed)
Physical Therapy Treatment Patient Details Name: Erik Atkins MRN: [id number] DOB: November 14, 1940 Today's Date: 02/12/2012 Time: 4098-1191 PT Time Calculation (min): 17 min  PT Assessment / Plan / Recommendation Comments on Treatment Session  Patient awaiting SNF placement as he does not have adequate assistance at home. Continuing to make progress with use of RW. Cues for safety. Continue with current POC    Follow Up Recommendations  Skilled nursing facility    Barriers to Discharge        Equipment Recommendations  Defer to next venue    Recommendations for Other Services    Frequency Min 4X/week   Plan Discharge plan remains appropriate;Frequency remains appropriate    Precautions / Restrictions Precautions Precautions: Cervical Cervical Brace: Hard collar;Applied in supine position   Pertinent Vitals/Pain     Mobility  Bed Mobility Supine to Sit: 4: Min assist Sitting - Scoot to Edge of Bed: 4: Min guard Details for Bed Mobility Assistance: A for safety. Cues for positioning Transfers Sit to Stand: 4: Min assist;With upper extremity assist;From bed Stand to Sit: 4: Min assist;With upper extremity assist;To chair/3-in-1 Details for Transfer Assistance: Cues for safe hand placement. A for stability and to control descent with sitting Ambulation/Gait Ambulation/Gait Assistance: 4: Min assist Ambulation Distance (Feet): 260 Feet Assistive device: Rolling walker Ambulation/Gait Assistance Details: Cues for upright posture but patient with increased posture since previous sessions. No LOB Gait Pattern: Step-through pattern;Decreased stride length;Trunk flexed    Exercises     PT Diagnosis:    PT Problem List:   PT Treatment Interventions:     PT Goals Acute Rehab PT Goals PT Goal: Supine/Side to Sit - Progress: Progressing toward goal PT Goal: Sit to Stand - Progress: Progressing toward goal PT Goal: Stand to Sit - Progress: Progressing toward goal PT Transfer  Goal: Bed to Chair/Chair to Bed - Progress: Progressing toward goal PT Goal: Ambulate - Progress: Progressing toward goal  Visit Information  Last PT Received On: 02/12/12 Assistance Needed: +1    Subjective Data      Cognition  Overall Cognitive Status: Appears within functional limits for tasks assessed/performed Arousal/Alertness: Awake/alert Orientation Level: Appears intact for tasks assessed Behavior During Session: [facility] for tasks performed    Balance     End of Session PT - End of Session Equipment Utilized During Treatment: Gait belt;Cervical collar Activity Tolerance: Patient tolerated treatment well Patient left: in chair;with call bell/phone within reach Nurse Communication: Mobility status   GP     Fredrich Birks 02/12/2012, 2:43 PM 02/12/2012 Fredrich Birks PTA [phone] pager [phone] office

## 2012-02-12 NOTE — Clinical Social Work Note (Signed)
Clinical Social Work  Pt is ready for discharge to [facility]. Facility has received discharge summary and is ready to accept pt. Pt and wife are agreeable to discharge plan. Pt's wife will provide transportation, which MD is agreeable to. CSW is signing off as no further needs identified.   Dede Query, MSW, Theresia Majors [phone]

## 2012-02-12 NOTE — Discharge Summary (Signed)
Physician Discharge Summary  Patient ID: Erik Atkins MRN: [id number] DOB/AGE: [birth year] 71 y.o.  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 *    Discharged Condition: good  Hospital Course: I admitted the patient to [facility] on 02/06/2012. On that day I performed a C3-4 and C4-5 decompression, instrumentation, and fusion. The surgery went well.  Patient's postoperative course was remarkable for some difficulty with ambulation. He was worked up with a postoperative cervical MRI which looked good. We had PT and OT see the patient. Arrangements were made for him to be transferred to [facility] [place] for further rehabilitation. The patient was transferred there on 02/12/2012. The patient was given oral and written discharge instructions. All the patient and his wife's questions were answered.  The patient has had urinary retention throughout his hospitalization. I've spoken to the urologist Dr.Ottlin's office. They have recommended the patient be discharged with a Foley catheter. The plan is to try a voiding trial in a couple days. If he fails this he can followup with Dr. Wynelle Link in his office or followup with a local urologist in [place].  Consults: Telephone consultation with Dr. Wynelle Link as above Significant Diagnostic Studies: Cervical MRI Treatments: C3-4 and C4-5 decompressive laminectomy, instrumentation and fusion Discharge Exam: Blood pressure 130/77, pulse 76, temperature 98 F (36.7 C), temperature source Oral, resp. rate 18, height 5\' 5"  (1.651 m), weight 55.4 kg (122 lb 2.2 oz), SpO2 95.00%. The patient is alert and oriented. His strength is grossly normal. His wound is healing well.  Disposition: [facility] skilled nursing facility and [facility].  Discharge Orders    Future Orders Please  Complete By Expires   Diet - low sodium heart healthy      Increase activity slowly      Discharge instructions      Comments:   Call [phone] for a followup appointment.   No dressing needed      Call MD for:  temperature >100.4      Call MD for:  persistant nausea and vomiting      Call MD for:  severe uncontrolled pain      Call MD for:  redness, tenderness, or signs of infection (pain, swelling, redness, odor or green/yellow discharge around incision site)      Call MD for:  difficulty breathing, headache or visual disturbances      Call MD for:  hives      Call MD for:  persistant dizziness or light-headedness      Call MD for:  extreme fatigue          Medication List     As of 02/12/2012 12:43 PM    STOP taking these medications         HYDROcodone-acetaminophen 2.5-500 MG per tablet   Commonly known as: VICODIN      TAKE these medications         ALPRAZolam 0.25 MG tablet   Commonly known as: XANAX   Take 0.25 mg by mouth 5 (five) times daily.      cyanocobalamin 1000 MCG/ML injection   Commonly known as: (VITAMIN B-12)   Inject 1,000 mcg into the muscle every 30 (thirty) days.      DSS 100 MG Caps   Take 100 mg by mouth 2 (two) times daily.      gabapentin 600 MG tablet  Commonly known as: NEURONTIN   Take 600 mg by mouth 3 (three) times daily.      methyldopa-hydrochlorothiazide 250-25 MG per tablet   Commonly known as: ALDORIL-25   Take 1 tablet by mouth 2 (two) times daily.      oxyCODONE-acetaminophen 5-325 MG per tablet   Commonly known as: PERCOCET/ROXICET   Take 1-2 tablets by mouth every 4 (four) hours as needed.      oxyCODONE-acetaminophen 10-325 MG per tablet   Commonly known as: PERCOCET   Take 1 tablet by mouth every 4 (four) hours as needed for pain.      SPIRIVA HANDIHALER 18 MCG inhalation capsule   Generic drug: tiotropium   Place 1 capsule into inhaler and inhale daily.      Tamsulosin HCl 0.4 MG Caps   Commonly known as:  FLOMAX   Take 1 capsule (0.4 mg total) by mouth daily.      Tripelennamine HCl Powd   Take 25 mg by mouth 2 (two) times daily.      valsartan 80 MG tablet   Commonly known as: DIOVAN   Take 80 mg by mouth daily.         SignedCristi Loron 02/12/2012, 12:43 PM

## 2012-02-12 NOTE — Clinical Social Work Note (Signed)
Clinical Social Work  CSW met with pt and wife to provide bed offers. Pt and wife have chosen [facility]. CSW contacted [facility]'s admissions's coordinator and left a message requesting a return phone call.   CSW also spoke with MD to address discharge. CSW will continue to follow to facilitate discharge to [facility].   Dede Query, MSW, Theresia Majors [phone]

## 2012-05-22 ENCOUNTER — Ambulatory Visit (INDEPENDENT_AMBULATORY_CARE_PROVIDER_SITE_OTHER): Payer: Medicare Other | Admitting: Urology

## 2012-05-22 DIAGNOSIS — R339 Retention of urine, unspecified: Secondary | ICD-10-CM

## 2012-06-12 ENCOUNTER — Other Ambulatory Visit (HOSPITAL_COMMUNITY): Payer: Self-pay | Admitting: Pulmonary Disease

## 2012-06-12 DIAGNOSIS — R634 Abnormal weight loss: Secondary | ICD-10-CM

## 2012-06-12 DIAGNOSIS — R109 Unspecified abdominal pain: Secondary | ICD-10-CM

## 2012-06-16 ENCOUNTER — Ambulatory Visit (HOSPITAL_COMMUNITY): Payer: Medicare Other

## 2012-06-17 ENCOUNTER — Other Ambulatory Visit (HOSPITAL_COMMUNITY): Payer: Self-pay | Admitting: Pulmonary Disease

## 2012-06-17 DIAGNOSIS — R634 Abnormal weight loss: Secondary | ICD-10-CM

## 2012-06-17 DIAGNOSIS — R109 Unspecified abdominal pain: Secondary | ICD-10-CM

## 2012-06-18 ENCOUNTER — Ambulatory Visit (HOSPITAL_COMMUNITY): Payer: Medicare Other

## 2012-06-22 ENCOUNTER — Ambulatory Visit (HOSPITAL_COMMUNITY): Payer: Medicare Other

## 2012-06-22 ENCOUNTER — Ambulatory Visit (HOSPITAL_COMMUNITY)
Admission: RE | Admit: 2012-06-22 | Discharge: 2012-06-22 | Disposition: A | Discharge status: Home / Self Care | Payer: Medicare Other | Source: Ambulatory Visit | Attending: Pulmonary Disease | Admitting: Pulmonary Disease

## 2012-06-22 DIAGNOSIS — R109 Unspecified abdominal pain: Secondary | ICD-10-CM | POA: Insufficient documentation

## 2012-06-22 DIAGNOSIS — J438 Other emphysema: Secondary | ICD-10-CM | POA: Insufficient documentation

## 2012-06-22 DIAGNOSIS — R634 Abnormal weight loss: Secondary | ICD-10-CM | POA: Insufficient documentation

## 2012-06-22 MED ORDER — IOHEXOL 300 MG/ML  SOLN
100.0000 mL | Freq: Once | INTRAMUSCULAR | Status: AC | PRN
  Administered 2012-06-22: 100 mL via INTRAVENOUS

## 2012-06-26 ENCOUNTER — Ambulatory Visit: Payer: Medicare Other | Admitting: Urology

## 2012-06-30 ENCOUNTER — Ambulatory Visit (INDEPENDENT_AMBULATORY_CARE_PROVIDER_SITE_OTHER): Payer: Medicare Other | Admitting: Urology

## 2012-06-30 DIAGNOSIS — R339 Retention of urine, unspecified: Secondary | ICD-10-CM

## 2012-08-04 ENCOUNTER — Ambulatory Visit (INDEPENDENT_AMBULATORY_CARE_PROVIDER_SITE_OTHER): Payer: Medicare Other | Admitting: Urology

## 2012-09-04 ENCOUNTER — Ambulatory Visit (INDEPENDENT_AMBULATORY_CARE_PROVIDER_SITE_OTHER): Payer: Medicare Other | Admitting: Urology

## 2012-09-04 DIAGNOSIS — R339 Retention of urine, unspecified: Secondary | ICD-10-CM

## 2012-10-02 ENCOUNTER — Ambulatory Visit (INDEPENDENT_AMBULATORY_CARE_PROVIDER_SITE_OTHER): Payer: Medicare Other | Admitting: Urology

## 2012-10-02 DIAGNOSIS — R339 Retention of urine, unspecified: Secondary | ICD-10-CM

## 2012-10-30 ENCOUNTER — Ambulatory Visit (INDEPENDENT_AMBULATORY_CARE_PROVIDER_SITE_OTHER): Payer: Medicare Other | Admitting: Urology

## 2012-10-30 DIAGNOSIS — R339 Retention of urine, unspecified: Secondary | ICD-10-CM

## 2012-12-01 ENCOUNTER — Ambulatory Visit (INDEPENDENT_AMBULATORY_CARE_PROVIDER_SITE_OTHER): Payer: Medicare Other | Admitting: Urology

## 2012-12-01 DIAGNOSIS — R339 Retention of urine, unspecified: Secondary | ICD-10-CM

## 2012-12-29 ENCOUNTER — Ambulatory Visit (INDEPENDENT_AMBULATORY_CARE_PROVIDER_SITE_OTHER): Payer: Medicare Other | Admitting: Urology

## 2012-12-29 DIAGNOSIS — N400 Enlarged prostate without lower urinary tract symptoms: Secondary | ICD-10-CM

## 2012-12-29 DIAGNOSIS — R339 Retention of urine, unspecified: Secondary | ICD-10-CM

## 2013-02-09 ENCOUNTER — Ambulatory Visit (INDEPENDENT_AMBULATORY_CARE_PROVIDER_SITE_OTHER): Payer: Medicare Other | Admitting: Urology

## 2013-02-09 DIAGNOSIS — R339 Retention of urine, unspecified: Secondary | ICD-10-CM

## 2013-03-23 ENCOUNTER — Ambulatory Visit (INDEPENDENT_AMBULATORY_CARE_PROVIDER_SITE_OTHER): Payer: Medicare Other | Admitting: Urology

## 2013-03-23 DIAGNOSIS — R339 Retention of urine, unspecified: Secondary | ICD-10-CM

## 2013-04-27 ENCOUNTER — Encounter: Payer: Self-pay | Admitting: Internal Medicine

## 2013-05-04 ENCOUNTER — Encounter (INDEPENDENT_AMBULATORY_CARE_PROVIDER_SITE_OTHER): Payer: Self-pay

## 2013-05-04 ENCOUNTER — Ambulatory Visit (INDEPENDENT_AMBULATORY_CARE_PROVIDER_SITE_OTHER): Payer: Medicare Other | Admitting: Urology

## 2013-05-04 DIAGNOSIS — R339 Retention of urine, unspecified: Secondary | ICD-10-CM

## 2013-06-15 ENCOUNTER — Ambulatory Visit (INDEPENDENT_AMBULATORY_CARE_PROVIDER_SITE_OTHER): Payer: Medicare Other | Admitting: Urology

## 2013-06-15 DIAGNOSIS — R339 Retention of urine, unspecified: Secondary | ICD-10-CM

## 2013-07-27 ENCOUNTER — Ambulatory Visit (INDEPENDENT_AMBULATORY_CARE_PROVIDER_SITE_OTHER): Payer: Medicare Other | Admitting: Urology

## 2013-07-27 DIAGNOSIS — N400 Enlarged prostate without lower urinary tract symptoms: Secondary | ICD-10-CM

## 2013-07-27 DIAGNOSIS — R339 Retention of urine, unspecified: Secondary | ICD-10-CM

## 2013-09-07 ENCOUNTER — Ambulatory Visit (INDEPENDENT_AMBULATORY_CARE_PROVIDER_SITE_OTHER): Payer: Medicare Other | Admitting: Urology

## 2013-09-07 DIAGNOSIS — N400 Enlarged prostate without lower urinary tract symptoms: Secondary | ICD-10-CM

## 2013-09-07 DIAGNOSIS — R339 Retention of urine, unspecified: Secondary | ICD-10-CM

## 2013-09-07 DIAGNOSIS — N390 Urinary tract infection, site not specified: Secondary | ICD-10-CM

## 2013-10-19 ENCOUNTER — Ambulatory Visit (INDEPENDENT_AMBULATORY_CARE_PROVIDER_SITE_OTHER): Payer: Medicare Other | Admitting: Urology

## 2013-10-19 DIAGNOSIS — R339 Retention of urine, unspecified: Secondary | ICD-10-CM

## 2013-12-03 ENCOUNTER — Ambulatory Visit (INDEPENDENT_AMBULATORY_CARE_PROVIDER_SITE_OTHER): Payer: Medicare Other | Admitting: Urology

## 2013-12-03 DIAGNOSIS — N400 Enlarged prostate without lower urinary tract symptoms: Secondary | ICD-10-CM

## 2013-12-03 DIAGNOSIS — R339 Retention of urine, unspecified: Secondary | ICD-10-CM

## 2013-12-28 ENCOUNTER — Ambulatory Visit (INDEPENDENT_AMBULATORY_CARE_PROVIDER_SITE_OTHER): Payer: Medicare Other | Admitting: Urology

## 2013-12-28 DIAGNOSIS — R339 Retention of urine, unspecified: Secondary | ICD-10-CM

## 2013-12-28 DIAGNOSIS — N401 Enlarged prostate with lower urinary tract symptoms: Secondary | ICD-10-CM

## 2014-02-03 ENCOUNTER — Encounter (HOSPITAL_COMMUNITY): Payer: Self-pay | Admitting: Emergency Medicine

## 2014-02-03 ENCOUNTER — Emergency Department (HOSPITAL_COMMUNITY)
Admission: EM | Admit: 2014-02-03 | Discharge: 2014-02-03 | Disposition: A | Discharge status: Home / Self Care | Payer: Medicare Other | Attending: Emergency Medicine | Admitting: Emergency Medicine

## 2014-02-03 DIAGNOSIS — G589 Mononeuropathy, unspecified: Secondary | ICD-10-CM | POA: Diagnosis not present

## 2014-02-03 DIAGNOSIS — F411 Generalized anxiety disorder: Secondary | ICD-10-CM | POA: Diagnosis not present

## 2014-02-03 DIAGNOSIS — Z79899 Other long term (current) drug therapy: Secondary | ICD-10-CM | POA: Insufficient documentation

## 2014-02-03 DIAGNOSIS — Z792 Long term (current) use of antibiotics: Secondary | ICD-10-CM | POA: Diagnosis not present

## 2014-02-03 DIAGNOSIS — Z8701 Personal history of pneumonia (recurrent): Secondary | ICD-10-CM | POA: Insufficient documentation

## 2014-02-03 DIAGNOSIS — N489 Disorder of penis, unspecified: Secondary | ICD-10-CM | POA: Insufficient documentation

## 2014-02-03 DIAGNOSIS — Z880 Allergy status to penicillin: Secondary | ICD-10-CM | POA: Insufficient documentation

## 2014-02-03 DIAGNOSIS — F17200 Nicotine dependence, unspecified, uncomplicated: Secondary | ICD-10-CM | POA: Diagnosis not present

## 2014-02-03 DIAGNOSIS — J449 Chronic obstructive pulmonary disease, unspecified: Secondary | ICD-10-CM | POA: Diagnosis not present

## 2014-02-03 DIAGNOSIS — N390 Urinary tract infection, site not specified: Secondary | ICD-10-CM | POA: Insufficient documentation

## 2014-02-03 DIAGNOSIS — G600 Hereditary motor and sensory neuropathy: Secondary | ICD-10-CM | POA: Insufficient documentation

## 2014-02-03 DIAGNOSIS — I10 Essential (primary) hypertension: Secondary | ICD-10-CM | POA: Insufficient documentation

## 2014-02-03 DIAGNOSIS — R339 Retention of urine, unspecified: Secondary | ICD-10-CM

## 2014-02-03 DIAGNOSIS — T83511A Infection and inflammatory reaction due to indwelling urethral catheter, initial encounter: Secondary | ICD-10-CM

## 2014-02-03 DIAGNOSIS — R319 Hematuria, unspecified: Secondary | ICD-10-CM

## 2014-02-03 DIAGNOSIS — J4489 Other specified chronic obstructive pulmonary disease: Secondary | ICD-10-CM | POA: Insufficient documentation

## 2014-02-03 LAB — CBC WITH DIFFERENTIAL/PLATELET
BASOS ABS: 0 10*3/uL (ref 0.0–0.1)
BASOS PCT: 0 % (ref 0–1)
Eosinophils Absolute: 0.7 10*3/uL (ref 0.0–0.7)
Eosinophils Relative: 3 % (ref 0–5)
HEMATOCRIT: 38.5 % — AB (ref 39.0–52.0)
Hemoglobin: 13.2 g/dL (ref 13.0–17.0)
LYMPHS PCT: 15 % (ref 12–46)
Lymphs Abs: 3.9 10*3/uL (ref 0.7–4.0)
MCH: 32.8 pg (ref 26.0–34.0)
MCHC: 34.3 g/dL (ref 30.0–36.0)
MCV: 95.5 fL (ref 78.0–100.0)
MONO ABS: 1.4 10*3/uL — AB (ref 0.1–1.0)
Monocytes Relative: 5 % (ref 3–12)
NEUTROS ABS: 19.3 10*3/uL — AB (ref 1.7–7.7)
Neutrophils Relative %: 77 % (ref 43–77)
PLATELETS: 353 10*3/uL (ref 150–400)
RBC: 4.03 MIL/uL — ABNORMAL LOW (ref 4.22–5.81)
RDW: 12.6 % (ref 11.5–15.5)
WBC: 25.3 10*3/uL — AB (ref 4.0–10.5)

## 2014-02-03 LAB — BASIC METABOLIC PANEL
ANION GAP: 21 — AB (ref 5–15)
ANION GAP: 12 (ref 5–15)
BUN: 12 mg/dL (ref 6–23)
BUN: 12 mg/dL (ref 6–23)
CHLORIDE: 101 meq/L (ref 96–112)
CHLORIDE: 103 meq/L (ref 96–112)
CO2: 21 mEq/L (ref 19–32)
CO2: 27 meq/L (ref 19–32)
CREATININE: 0.87 mg/dL (ref 0.50–1.35)
CREATININE: 0.84 mg/dL (ref 0.50–1.35)
Calcium: 9.5 mg/dL (ref 8.4–10.5)
Calcium: 8.4 mg/dL (ref 8.4–10.5)
GFR calc Af Amer: >90 mL/min (ref >90)
GFR calc non Af Amer: 84 mL/min — ABNORMAL LOW (ref >90)
GFR, EST NON AFRICAN AMERICAN: 83 mL/min — AB (ref >90)
Glucose, Bld: 157 mg/dL — ABNORMAL HIGH (ref 70–99)
Glucose, Bld: 100 mg/dL — ABNORMAL HIGH (ref 70–99)
POTASSIUM: 3.6 meq/L — AB (ref 3.7–5.3)
Potassium: 3.8 mEq/L (ref 3.7–5.3)
Sodium: 143 mEq/L (ref 137–147)
Sodium: 142 mEq/L (ref 137–147)

## 2014-02-03 LAB — URINE MICROSCOPIC-ADD ON

## 2014-02-03 LAB — URINALYSIS, ROUTINE W REFLEX MICROSCOPIC
BILIRUBIN URINE: NEGATIVE
Glucose, UA: 100 mg/dL — AB
Ketones, ur: NEGATIVE mg/dL
NITRITE: POSITIVE — AB
SPECIFIC GRAVITY, URINE: 1.01 (ref 1.005–1.030)
UROBILINOGEN UA: 2 mg/dL — AB (ref 0.0–1.0)
pH: 5 (ref 5.0–8.0)

## 2014-02-03 LAB — LACTIC ACID, PLASMA: Lactic Acid, Venous: 1.1 mmol/L (ref 0.5–2.2)

## 2014-02-03 LAB — I-STAT CG4 LACTIC ACID, ED: LACTIC ACID, VENOUS: 5.53 mmol/L — AB (ref 0.5–2.2)

## 2014-02-03 MED ORDER — DEXTROSE 5 % IV SOLN
1.0000 g | Freq: Once | INTRAVENOUS | Status: AC
  Administered 2014-02-03: 1000 mg via INTRAVENOUS

## 2014-02-03 MED ORDER — DEXTROSE 5 % IV SOLN
INTRAVENOUS | Status: AC
  Administered 2014-02-03: 1000 mg via INTRAVENOUS
  Filled 2014-02-03: qty 10

## 2014-02-03 MED ORDER — MORPHINE SULFATE 4 MG/ML IJ SOLN
INTRAMUSCULAR | Status: AC
  Administered 2014-02-03: 4 mg via INTRAVENOUS
  Filled 2014-02-03: qty 1

## 2014-02-03 MED ORDER — ONDANSETRON HCL 4 MG/2ML IJ SOLN
INTRAMUSCULAR | Status: AC
  Administered 2014-02-03: 4 mg via INTRAVENOUS
  Filled 2014-02-03: qty 2

## 2014-02-03 MED ORDER — CIPROFLOXACIN HCL 500 MG PO TABS: 500.0000 mg | ORAL_TABLET | Freq: Two times a day (BID) | ORAL | Status: DC

## 2014-02-03 MED ORDER — ONDANSETRON HCL 4 MG/2ML IJ SOLN
4.0000 mg | Freq: Once | INTRAMUSCULAR | Status: AC
  Administered 2014-02-03: 4 mg via INTRAVENOUS

## 2014-02-03 MED ORDER — SODIUM CHLORIDE 0.9 % IV BOLUS (SEPSIS)
500.0000 mL | Freq: Once | INTRAVENOUS | Status: AC
  Administered 2014-02-03: 500 mL via INTRAVENOUS

## 2014-02-03 MED ORDER — MORPHINE SULFATE 4 MG/ML IJ SOLN
4.0000 mg | INTRAMUSCULAR | Status: DC | PRN
  Administered 2014-02-03: 4 mg via INTRAVENOUS

## 2014-02-03 MED ORDER — SODIUM CHLORIDE 0.9 % IV BOLUS (SEPSIS)
1000.0000 mL | Freq: Once | INTRAVENOUS | Status: AC
  Administered 2014-02-03: 500 mL via INTRAVENOUS

## 2014-02-03 MED ORDER — SODIUM CHLORIDE 0.9 % IV SOLN: Freq: Once | INTRAVENOUS | Status: DC

## 2014-02-03 NOTE — Discharge Instructions (Signed)
Acute Urinary Retention °Acute urinary retention is the temporary inability to urinate. °This is a common problem in older men. As men age their prostates become larger and block the flow of urine from the bladder. This is usually a problem that has come on gradually.  °HOME CARE INSTRUCTIONS °If you are sent home with a Foley catheter and a drainage system, you will need to discuss the best course of action with your health care provider. While the catheter is in, maintain a good intake of fluids. Keep the drainage bag emptied and lower than your catheter. This is so that contaminated urine will not flow back into your bladder, which could lead to a urinary tract infection. °There are two main types of drainage bags. One is a large bag that usually is used at night. It has a good capacity that will allow you to sleep through the night without having to empty it. The second type is called a leg bag. It has a smaller capacity, so it needs to be emptied more frequently. However, the main advantage is that it can be attached by a leg strap and can go underneath your clothing, allowing you the freedom to move about or leave your home. °Only take over-the-counter or prescription medicines for pain, discomfort, or fever as directed by your health care provider.  °SEEK MEDICAL CARE IF: °· You develop a low-grade fever. °· You experience spasms or leakage of urine with the spasms. °SEEK IMMEDIATE MEDICAL CARE IF:  °· You develop chills or fever. °· Your catheter stops draining urine. °· Your catheter falls out. °· You start to develop increased bleeding that does not respond to rest and increased fluid intake. °MAKE SURE YOU: °· Understand these instructions. °· Will watch your condition. °· Will get help right away if you are not doing well or get worse. °Document Released: 08/19/2000 Document Revised: 05/18/2013 Document Reviewed: 10/22/2012 °ExitCare® Patient Information ©2015 [employer]. This information is not  intended to replace advice given to you by your health care provider. Make sure you discuss any questions you have with your health care provider. °Urinary Tract Infection °Urinary tract infections (UTIs) can develop anywhere along your urinary tract. Your urinary tract is your body's drainage system for removing wastes and extra water. Your urinary tract includes two kidneys, two ureters, a bladder, and a urethra. Your kidneys are a pair of bean-shaped organs. Each kidney is about the size of your fist. They are located below your ribs, one on each side of your spine. °CAUSES °Infections are caused by microbes, which are microscopic organisms, including fungi, viruses, and bacteria. These organisms are so small that they can only be seen through a microscope. Bacteria are the microbes that most commonly cause UTIs. °SYMPTOMS  °Symptoms of UTIs may vary by age and gender of the patient and by the location of the infection. Symptoms in young women typically include a frequent and intense urge to urinate and a painful, burning feeling in the bladder or urethra during urination. Older women and men are more likely to be tired, shaky, and weak and have muscle aches and abdominal pain. A fever may mean the infection is in your kidneys. Other symptoms of a kidney infection include pain in your back or sides below the ribs, nausea, and vomiting. °DIAGNOSIS °To diagnose a UTI, your caregiver will ask you about your symptoms. Your caregiver also will ask to provide a urine sample. The urine sample will be tested for bacteria and white blood   cells. White blood cells are made by your body to help fight infection. °TREATMENT  °Typically, UTIs can be treated with medication. Because most UTIs are caused by a bacterial infection, they usually can be treated with the use of antibiotics. The choice of antibiotic and length of treatment depend on your symptoms and the type of bacteria causing your infection. °HOME CARE  INSTRUCTIONS °· If you were prescribed antibiotics, take them exactly as your caregiver instructs you. Finish the medication even if you feel better after you have only taken some of the medication. °· Drink enough water and fluids to keep your urine clear or pale yellow. °· Avoid caffeine, tea, and carbonated beverages. They tend to irritate your bladder. °· Empty your bladder often. Avoid holding urine for long periods of time. °· Empty your bladder before and after sexual intercourse. °· After a bowel movement, women should cleanse from front to back. Use each tissue only once. °SEEK MEDICAL CARE IF:  °· You have back pain. °· You develop a fever. °· Your symptoms do not begin to resolve within 3 days. °SEEK IMMEDIATE MEDICAL CARE IF:  °· You have severe back pain or lower abdominal pain. °· You develop chills. °· You have nausea or vomiting. °· You have continued burning or discomfort with urination. °MAKE SURE YOU:  °· Understand these instructions. °· Will watch your condition. °· Will get help right away if you are not doing well or get worse. °Document Released: 02/20/2005 Document Revised: 11/12/2011 Document Reviewed: 06/21/2011 °ExitCare® Patient Information ©2015 [employer]. This information is not intended to replace advice given to you by your health care provider. Make sure you discuss any questions you have with your health care provider. ° °

## 2014-02-03 NOTE — ED Notes (Addendum)
Pt has a foley, penile pain ? Not draining.    Onset this am.  Pacing at triage with a walker.  Very anxious, constantly moving.

## 2014-02-03 NOTE — ED Notes (Signed)
Pt pain down from 10 to 4 with insertion of new catheter and draining, approx 334ml.

## 2014-02-03 NOTE — ED Notes (Signed)
Pt very restful. Laughing and joking with staff now and denies any pain. Water given.

## 2014-02-03 NOTE — ED Provider Notes (Signed)
CSN: [id number]     Arrival date & time 02/03/14  1354 History  This chart was scribed for Tanna Furry, MD by Ludger Nutting, ED Scribe. This patient was seen in room APA09/APA09 and the patient's care was started 2:17 PM.    Chief Complaint  Patient presents with  . Penis Pain   The history is provided by the patient and the spouse. No language interpreter was used.    HPI Comments: DOUGLASS DUNSHEE is a 73 y.o. male with past medical history of Charcot Lelan Pons Tooth muscular atrophy who presents to the Emergency Department complaining of constant, unchanged, severe penile pain that began this morning. Wife states patient had blood in the catheter when it was emptied this morning. He has a UTI but not yet taking any antibiotics. He denies any fever, chills, abdominal pain, scrotal pain.    Past Medical History  Diagnosis Date  . Hypertension   . Nerve disorder 1996    "Charot Marie"  . COPD (chronic obstructive pulmonary disease)   . Charcot Marie Tooth muscular atrophy   . Neuropathy   . Anxiety   . History of pneumonia    Past Surgical History  Procedure Laterality Date  . Tonsillectomy    . Mastectomy for gynecomastia  1979 and 1981  . Cataract extraction, bilateral    . Colonoscopy    . Posterior cervical fusion/foraminotomy  02/06/2012    Procedure: POSTERIOR CERVICAL FUSION/FORAMINOTOMY LEVEL 2;  Surgeon: Ophelia Charter, MD;  Location: [place] [facility];  Service: Neurosurgery;  Laterality: N/A;  Posterior Cervical Three-Four/Four-Five Laminectomy and Fusion with Instrumentation   History reviewed. No pertinent family history. History  Substance Use Topics  . Smoking status: Current Every Day Smoker -- 0.50 packs/day for 50 years    Types: Cigarettes  . Smokeless tobacco: Never Used  . Alcohol Use: 12.6 oz/week    21 Cans of beer per week     Comment: 2-3 beers a day   says no for 3 years  9/10    Review of Systems  Constitutional: Negative for fever, chills, diaphoresis,  appetite change and fatigue.  HENT: Negative for mouth sores, sore throat and trouble swallowing.   Eyes: Negative for visual disturbance.  Respiratory: Negative for cough, chest tightness, shortness of breath and wheezing.   Cardiovascular: Negative for chest pain.  Gastrointestinal: Negative for nausea, vomiting, abdominal pain, diarrhea and abdominal distention.  Endocrine: Negative for polydipsia, polyphagia and polyuria.  Genitourinary: Positive for penile pain. Negative for dysuria, frequency, hematuria, penile swelling, scrotal swelling and testicular pain.  Musculoskeletal: Negative for gait problem.  Skin: Negative for color change, pallor and rash.  Neurological: Negative for dizziness, syncope, light-headedness and headaches.  Hematological: Does not bruise/bleed easily.  Psychiatric/Behavioral: Negative for behavioral problems and confusion.      Allergies  Penicillins; Darvon; Celexa; Cortisone; Cymbalta; Eggs or egg-derived products; Guaifenesin er; Keflex; Motrin; Pumpkin seed; Sweet potato; Tea; and Zoloft  Home Medications   Prior to Admission medications   Medication Sig Start Date End Date Taking? Authorizing Provider  ALPRAZolam (XANAX) 0.25 MG tablet Take 0.25 mg by mouth 5 (five) times daily.     Yes Historical Provider, MD  CRANBERRY PO Take 1 tablet by mouth daily.   Yes Historical Provider, MD  escitalopram (LEXAPRO) 10 MG tablet Take 1 tablet by mouth daily. 12/02/13  Yes Historical Provider, MD  HYDROcodone-acetaminophen (NORCO/VICODIN) 5-325 MG per tablet Take 1 tablet by mouth every 4 (four) hours as needed. 01/22/14  Yes Historical Provider, MD  Phenazopyridine HCl (AZO DINE MAXIMUM STRENGTH) 97.5 MG TABS Take 1 tablet by mouth daily.   Yes Historical Provider, MD  SPIRIVA HANDIHALER 18 MCG inhalation capsule Place 1 capsule into inhaler and inhale daily. 12/11/11  Yes Historical Provider, MD  tetrahydrozoline 0.05 % ophthalmic solution Place 1-2 drops into  both eyes daily.   Yes Historical Provider, MD  torsemide (DEMADEX) 100 MG tablet Take 0.5 tablets by mouth daily. 11/22/13  Yes Historical Provider, MD  Tripelennamine HCl POWD Take 25 mg by mouth 2 (two) times daily.    Yes Historical Provider, MD  valsartan (DIOVAN) 80 MG tablet Take 80 mg by mouth daily.     Yes Historical Provider, MD  ciprofloxacin (CIPRO) 500 MG tablet Take 1 tablet (500 mg total) by mouth every 12 (twelve) hours. 02/03/14   Tanna Furry, MD  ciprofloxacin (CIPRO) 500 MG tablet Take 1 tablet (500 mg total) by mouth every 12 (twelve) hours. 02/03/14   Tanna Furry, MD   BP 140/54  Pulse 88  Temp(Src) 98 F (36.7 C) (Oral)  Resp 20  Ht 5\' 6"  (1.676 m)  Wt 140 lb (63.504 kg)  BMI 22.61 kg/m2  SpO2 94% Physical Exam  Nursing note and vitals reviewed. Constitutional: He is oriented to person, place, and time. He appears well-developed and well-nourished. No distress.  HENT:  Head: Normocephalic.  Eyes: Conjunctivae are normal. Pupils are equal, round, and reactive to light. No scleral icterus.  Neck: Normal range of motion. Neck supple. No thyromegaly present.  Cardiovascular: Normal rate, regular rhythm and normal heart sounds.  Exam reveals no gallop and no friction rub.   No murmur heard. Pulmonary/Chest: Effort normal and breath sounds normal. No respiratory distress. He has no wheezes. He has no rales.  Abdominal: Soft. Bowel sounds are normal. He exhibits no distension. There is tenderness (suprapubic ). There is no rebound.  Genitourinary:  No palpable abnormalities on or around the penis. Bedside ultrasound shows minimal distension of bladder. Purulent and cloudy urine in catheter   Musculoskeletal: Normal range of motion.  Neurological: He is alert and oriented to person, place, and time.  Skin: Skin is warm and dry. No rash noted.  Psychiatric: He has a normal mood and affect. His behavior is normal.    ED Course  Procedures (including critical care  time)  DIAGNOSTIC STUDIES: Oxygen Saturation is 95% on RA, adequate by my interpretation.    COORDINATION OF CARE: 2:34 PM Removed and replaced catheter. Discussed treatment plan with pt at bedside and pt agreed to plan.   Labs Review Labs Reviewed  CBC WITH DIFFERENTIAL - Abnormal; Notable for the following:    WBC 25.3 (*)    RBC 4.03 (*)    HCT 38.5 (*)    Neutro Abs 19.3 (*)    Monocytes Absolute 1.4 (*)    All other components within normal limits  BASIC METABOLIC PANEL - Abnormal; Notable for the following:    Potassium 3.6 (*)    Glucose, Bld 157 (*)    GFR calc non Af Amer 83 (*)    Anion gap 21 (*)    All other components within normal limits  URINALYSIS, ROUTINE W REFLEX MICROSCOPIC - Abnormal; Notable for the following:    Color, Urine ORANGE (*)    APPearance HAZY (*)    Glucose, UA 100 (*)    Hgb urine dipstick LARGE (*)    Protein, ur >300 (*)    Urobilinogen, UA  2.0 (*)    Nitrite POSITIVE (*)    Leukocytes, UA SMALL (*)    All other components within normal limits  URINE MICROSCOPIC-ADD ON - Abnormal; Notable for the following:    Bacteria, UA FEW (*)    All other components within normal limits  BASIC METABOLIC PANEL - Abnormal; Notable for the following:    Glucose, Bld 100 (*)    GFR calc non Af Amer 84 (*)    All other components within normal limits  I-STAT CG4 LACTIC ACID, ED - Abnormal; Notable for the following:    Lactic Acid, Venous 5.53 (*)    All other components within normal limits  URINE CULTURE  LACTIC ACID, PLASMA    Imaging Review No results found.   EKG Interpretation None      MDM   Final diagnoses:  Urinary retention  Hematuria  Urinary tract infection associated with catheterization of urinary tract, initial encounter   Patient's labs normalized after IV fluids. Symptoms well controlled. Strongly desires to be discharged home. Given IV antibiotics. Plan is discharge home. Continue catheter care and home.  Tanna Furry, MD 02/05/14 [id number]

## 2014-02-03 NOTE — ED Notes (Signed)
Difficulty emptying balloon to current cath. Dr Jeneen Rinks cut tubing and removed at this time. Pt has scoriated testicles with rawness noted, pt states that is not new. Pt had no relief with d/c of foley catheter. Urine in current bag is dark but clear. Dr Jeneen Rinks inserting 22f foley cath at this time and using [place] machine

## 2014-02-07 ENCOUNTER — Telehealth (HOSPITAL_COMMUNITY): Payer: Self-pay

## 2014-02-07 LAB — URINE CULTURE: Colony Count: 75000

## 2014-02-08 ENCOUNTER — Telehealth (HOSPITAL_BASED_OUTPATIENT_CLINIC_OR_DEPARTMENT_OTHER): Payer: Self-pay | Admitting: Emergency Medicine

## 2014-02-08 NOTE — Telephone Encounter (Signed)
Post ED Visit - Positive Culture Follow-up: Successful Patient Follow-Up  Culture assessed and recommendations reviewed by: []  Wes Hilliard, Pharm.D., BCPS [x]  Heide Guile, Pharm.D., BCPS []  Alycia Rossetti, Pharm.D., BCPS []  [place], [place].D., BCPS, AAHIVP []  Legrand Como, Pharm.D., BCPS, AAHIVP []  Hassie Bruce, Pharm.D. [x]  Milus Glazier, Pharm.D.  Positive urine culture 75,000 colonies/ml MRSA  []  Patient discharged without antimicrobial prescription and treatment is now indicated [x]  Organism is resistant to prescribed ED discharge antimicrobial []  Patient with positive blood cultures  Changes discussed with ED provider: Hyman Bible PA  New antibiotic prescription stop Cipro, Bactrim DS one tab bid x 7 days Called to [facility] patient, date 02/08/2014, time 1424   Hazle Nordmann 02/08/2014, 2:22 PM

## 2014-02-08 NOTE — Telephone Encounter (Signed)
Post ED Visit - Positive Culture Follow-up: Successful Patient Follow-Up  Culture assessed and recommendations reviewed by: []  Wes [place], Pharm.D., BCPS []  Heide Guile, Pharm.D., BCPS []  Alycia Rossetti, Pharm.D., BCPS []  [place], Pharm.D., BCPS, AAHIVP []  Legrand Como, Pharm.D., BCPS, AAHIVP []  Hassie Bruce, Pharm.D. [x]  Milus Glazier, Pharm.D.  Positive urine culture 75,000 colonies/ml MRSA  []  Patient discharged without antimicrobial prescription and treatment is now indicated [x]  Organism is resistant to prescribed ED discharge antimicrobial []  Patient with positive blood cultures  Changes discussed with ED provider: Hyman Bible PA  New antibiotic prescription Septra ds one tab bid x 7 days Called to [facility] patient, date  02/08/14, time 1600   Hazle Nordmann 02/08/2014, 6:05 PM

## 2014-02-08 NOTE — Progress Notes (Signed)
ED Antimicrobial Stewardship Positive Culture Follow Up   Erik Atkins is an 73 y.o. male who presented to [facility] on 02/03/2014 with a chief complaint of  Chief Complaint  Patient presents with  . Penis Pain    Recent Results (from the past 720 hour(s))  URINE CULTURE     Status: None   Collection Time    02/03/14  2:42 PM      Result Value Ref Range Status   Specimen Description URINE, CATHETERIZED   Final   Special Requests NONE   Final   Culture  Setup Time     Final   Value: 02/03/2014 23:17     Performed at [place]     Final   Value: 75,000 COLONIES/ML     Performed at [facility]   Culture     Final   Value: METHICILLIN RESISTANT STAPHYLOCOCCUS AUREUS     Note: RIFAMPIN AND GENTAMICIN SHOULD NOT BE USED AS SINGLE DRUGS FOR TREATMENT OF STAPH INFECTIONS. CRITICAL RESULT CALLED TO, READ BACK BY AND VERIFIED WITH: [email or web address]  ON [id number] BY [facility]     Performed at [facility]   Report Status 02/07/2014 FINAL   Final   Organism ID, Bacteria METHICILLIN RESISTANT STAPHYLOCOCCUS AUREUS   Final    [x]  Treated with ciprofloxacin, organism resistant to prescribed antimicrobial []  Patient discharged originally without antimicrobial agent and treatment is now indicated  Stop ciprofloxacin  New antibiotic prescription: Bactrim 1 DS tablet BID x 7 days  ED Provider: Hyman Bible PA-C   Reginia Naas 02/08/2014, 9:08 AM Infectious Diseases Pharmacist Phone# [phone]

## 2014-03-22 ENCOUNTER — Ambulatory Visit (INDEPENDENT_AMBULATORY_CARE_PROVIDER_SITE_OTHER): Payer: Medicare Other | Admitting: Urology

## 2014-03-22 DIAGNOSIS — R3914 Feeling of incomplete bladder emptying: Secondary | ICD-10-CM

## 2014-05-03 ENCOUNTER — Ambulatory Visit (INDEPENDENT_AMBULATORY_CARE_PROVIDER_SITE_OTHER): Payer: Medicare Other | Admitting: Urology

## 2014-05-03 DIAGNOSIS — R3914 Feeling of incomplete bladder emptying: Secondary | ICD-10-CM

## 2014-06-14 ENCOUNTER — Ambulatory Visit (INDEPENDENT_AMBULATORY_CARE_PROVIDER_SITE_OTHER): Payer: Medicare Other | Admitting: Urology

## 2014-06-14 DIAGNOSIS — R339 Retention of urine, unspecified: Secondary | ICD-10-CM

## 2014-06-14 DIAGNOSIS — N3289 Other specified disorders of bladder: Secondary | ICD-10-CM

## 2014-06-28 DIAGNOSIS — G600 Hereditary motor and sensory neuropathy: Secondary | ICD-10-CM | POA: Diagnosis not present

## 2014-06-28 DIAGNOSIS — M545 Low back pain: Secondary | ICD-10-CM | POA: Diagnosis not present

## 2014-06-28 DIAGNOSIS — I10 Essential (primary) hypertension: Secondary | ICD-10-CM | POA: Diagnosis not present

## 2014-06-28 DIAGNOSIS — J449 Chronic obstructive pulmonary disease, unspecified: Secondary | ICD-10-CM | POA: Diagnosis not present

## 2014-07-26 ENCOUNTER — Ambulatory Visit (INDEPENDENT_AMBULATORY_CARE_PROVIDER_SITE_OTHER): Payer: Medicare Other | Admitting: Urology

## 2014-07-26 DIAGNOSIS — N3289 Other specified disorders of bladder: Secondary | ICD-10-CM | POA: Diagnosis not present

## 2014-07-26 DIAGNOSIS — R339 Retention of urine, unspecified: Secondary | ICD-10-CM

## 2014-09-06 ENCOUNTER — Ambulatory Visit (INDEPENDENT_AMBULATORY_CARE_PROVIDER_SITE_OTHER): Payer: Medicare Other | Admitting: Urology

## 2014-09-06 DIAGNOSIS — R339 Retention of urine, unspecified: Secondary | ICD-10-CM | POA: Diagnosis not present

## 2014-10-18 ENCOUNTER — Ambulatory Visit (INDEPENDENT_AMBULATORY_CARE_PROVIDER_SITE_OTHER): Payer: Medicare Other | Admitting: Urology

## 2014-10-18 DIAGNOSIS — R339 Retention of urine, unspecified: Secondary | ICD-10-CM | POA: Diagnosis not present

## 2014-11-15 DIAGNOSIS — Z0000 Encounter for general adult medical examination without abnormal findings: Secondary | ICD-10-CM | POA: Diagnosis not present

## 2014-11-22 DIAGNOSIS — I10 Essential (primary) hypertension: Secondary | ICD-10-CM | POA: Diagnosis not present

## 2014-11-22 DIAGNOSIS — J449 Chronic obstructive pulmonary disease, unspecified: Secondary | ICD-10-CM | POA: Diagnosis not present

## 2014-11-22 DIAGNOSIS — D72829 Elevated white blood cell count, unspecified: Secondary | ICD-10-CM | POA: Diagnosis not present

## 2014-11-22 DIAGNOSIS — Z0000 Encounter for general adult medical examination without abnormal findings: Secondary | ICD-10-CM | POA: Diagnosis not present

## 2014-11-22 DIAGNOSIS — G600 Hereditary motor and sensory neuropathy: Secondary | ICD-10-CM | POA: Diagnosis not present

## 2014-11-29 ENCOUNTER — Ambulatory Visit (INDEPENDENT_AMBULATORY_CARE_PROVIDER_SITE_OTHER): Payer: Medicare Other | Admitting: Urology

## 2014-11-29 DIAGNOSIS — R339 Retention of urine, unspecified: Secondary | ICD-10-CM

## 2015-01-10 ENCOUNTER — Ambulatory Visit (INDEPENDENT_AMBULATORY_CARE_PROVIDER_SITE_OTHER): Payer: Medicare Other | Admitting: Urology

## 2015-01-10 DIAGNOSIS — N320 Bladder-neck obstruction: Secondary | ICD-10-CM

## 2015-01-10 DIAGNOSIS — N329 Bladder disorder, unspecified: Secondary | ICD-10-CM | POA: Diagnosis not present

## 2015-02-07 ENCOUNTER — Ambulatory Visit (INDEPENDENT_AMBULATORY_CARE_PROVIDER_SITE_OTHER): Payer: Medicare Other | Admitting: Urology

## 2015-02-07 DIAGNOSIS — R339 Retention of urine, unspecified: Secondary | ICD-10-CM | POA: Diagnosis not present

## 2015-02-16 DIAGNOSIS — G600 Hereditary motor and sensory neuropathy: Secondary | ICD-10-CM | POA: Diagnosis not present

## 2015-02-16 DIAGNOSIS — J449 Chronic obstructive pulmonary disease, unspecified: Secondary | ICD-10-CM | POA: Diagnosis not present

## 2015-02-16 DIAGNOSIS — I10 Essential (primary) hypertension: Secondary | ICD-10-CM | POA: Diagnosis not present

## 2015-03-21 ENCOUNTER — Ambulatory Visit (INDEPENDENT_AMBULATORY_CARE_PROVIDER_SITE_OTHER): Payer: Medicare Other | Admitting: Urology

## 2015-03-21 DIAGNOSIS — R339 Retention of urine, unspecified: Secondary | ICD-10-CM | POA: Diagnosis not present

## 2015-05-02 ENCOUNTER — Ambulatory Visit (INDEPENDENT_AMBULATORY_CARE_PROVIDER_SITE_OTHER): Payer: Medicare Other | Admitting: Urology

## 2015-05-02 DIAGNOSIS — R339 Retention of urine, unspecified: Secondary | ICD-10-CM

## 2015-06-09 ENCOUNTER — Encounter (HOSPITAL_COMMUNITY): Payer: Self-pay | Admitting: *Deleted

## 2015-06-09 ENCOUNTER — Emergency Department (HOSPITAL_COMMUNITY)
Admission: EM | Admit: 2015-06-09 | Discharge: 2015-06-09 | Disposition: A | Discharge status: Home / Self Care | Payer: Medicare Other | Attending: Emergency Medicine | Admitting: Emergency Medicine

## 2015-06-09 DIAGNOSIS — T83018A Breakdown (mechanical) of other indwelling urethral catheter, initial encounter: Secondary | ICD-10-CM | POA: Diagnosis not present

## 2015-06-09 DIAGNOSIS — Z87891 Personal history of nicotine dependence: Secondary | ICD-10-CM | POA: Insufficient documentation

## 2015-06-09 DIAGNOSIS — J449 Chronic obstructive pulmonary disease, unspecified: Secondary | ICD-10-CM | POA: Diagnosis not present

## 2015-06-09 DIAGNOSIS — Z8669 Personal history of other diseases of the nervous system and sense organs: Secondary | ICD-10-CM | POA: Diagnosis not present

## 2015-06-09 DIAGNOSIS — Z79899 Other long term (current) drug therapy: Secondary | ICD-10-CM | POA: Diagnosis not present

## 2015-06-09 DIAGNOSIS — Y802 Prosthetic and other implants, materials and accessory physical medicine devices associated with adverse incidents: Secondary | ICD-10-CM | POA: Diagnosis not present

## 2015-06-09 DIAGNOSIS — Z880 Allergy status to penicillin: Secondary | ICD-10-CM | POA: Insufficient documentation

## 2015-06-09 DIAGNOSIS — Z8701 Personal history of pneumonia (recurrent): Secondary | ICD-10-CM | POA: Diagnosis not present

## 2015-06-09 DIAGNOSIS — F419 Anxiety disorder, unspecified: Secondary | ICD-10-CM | POA: Insufficient documentation

## 2015-06-09 DIAGNOSIS — I10 Essential (primary) hypertension: Secondary | ICD-10-CM | POA: Insufficient documentation

## 2015-06-09 DIAGNOSIS — Z466 Encounter for fitting and adjustment of urinary device: Secondary | ICD-10-CM | POA: Diagnosis not present

## 2015-06-09 DIAGNOSIS — T839XXA Unspecified complication of genitourinary prosthetic device, implant and graft, initial encounter: Secondary | ICD-10-CM

## 2015-06-09 MED ORDER — MORPHINE SULFATE (PF) 4 MG/ML IV SOLN
4.0000 mg | Freq: Once | INTRAVENOUS | Status: AC
  Administered 2015-06-09: 4 mg via INTRAMUSCULAR
  Filled 2015-06-09: qty 1

## 2015-06-09 MED ORDER — BELLADONNA-OPIUM 16.2-30 MG RE SUPP: 30.0000 mg | Freq: Two times a day (BID) | RECTAL | Status: DC | PRN

## 2015-06-09 MED ORDER — LIDOCAINE HCL 2 % EX GEL
1.0000 "application " | Freq: Once | CUTANEOUS | Status: AC
  Administered 2015-06-09: 1 via URETHRAL
  Filled 2015-06-09: qty 10

## 2015-06-09 NOTE — ED Provider Notes (Signed)
CSN: [id number]     Arrival date & time 06/09/15  1733 History   First MD Initiated Contact with Patient 06/09/15 1742     No chief complaint on file.    (Consider location/radiation/quality/duration/timing/severity/associated sxs/prior Treatment) HPI Patient presents after sudden loss of his Foley catheter. The patient has had a chronic catheter for years, and was actually scheduled to have this catheter changed in 4 days. Just prior to arrival the patient's catheter balloon popped, and his catheter was dislodged.  He notes that other than that he has been in his usual state of health. No new medication, diet, activity, no fever, chills, chest pain. Past Medical History  Diagnosis Date  . Hypertension   . Nerve disorder 1996    "Charot Marie"  . COPD (chronic obstructive pulmonary disease) ([place])   . Charcot Marie Tooth muscular atrophy   . Neuropathy ([place])   . Anxiety   . History of pneumonia    Past Surgical History  Procedure Laterality Date  . Tonsillectomy    . Mastectomy for gynecomastia  1979 and 1981  . Cataract extraction, bilateral    . Colonoscopy    . Posterior cervical fusion/foraminotomy  02/06/2012    Procedure: POSTERIOR CERVICAL FUSION/FORAMINOTOMY LEVEL 2;  Surgeon: Ophelia Charter, MD;  Location: [place] [facility];  Service: Neurosurgery;  Laterality: N/A;  Posterior Cervical Three-Four/Four-Five Laminectomy and Fusion with Instrumentation   No family history on file. Social History  Substance Use Topics  . Smoking status: Former Smoker -- 0.50 packs/day for 50 years    Types: Cigarettes  . Smokeless tobacco: Never Used  . Alcohol Use: No    Review of Systems  Constitutional: Negative for fever.  Respiratory: Negative for shortness of breath.   Cardiovascular: Negative for chest pain.  Genitourinary:       History of present illness  Musculoskeletal:       Negative aside from HPI  Skin:       Negative aside from HPI  Allergic/Immunologic: Negative  for immunocompromised state.  Neurological: Negative for weakness.      Allergies  Penicillins; Darvon; Celexa; Cortisone; Cymbalta; Eggs or egg-derived products; Guaifenesin er; Keflex; Motrin; Pumpkin seed; Sweet potato; Tea; and Zoloft  Home Medications   Prior to Admission medications   Medication Sig Start Date End Date Taking? Authorizing Provider  ALPRAZolam (XANAX) 0.25 MG tablet Take 0.25 mg by mouth 5 (five) times daily.      Historical Provider, MD  ciprofloxacin (CIPRO) 500 MG tablet Take 1 tablet (500 mg total) by mouth every 12 (twelve) hours. 02/03/14   Tanna Furry, MD  ciprofloxacin (CIPRO) 500 MG tablet Take 1 tablet (500 mg total) by mouth every 12 (twelve) hours. 02/03/14   Tanna Furry, MD  CRANBERRY PO Take 1 tablet by mouth daily.    Historical Provider, MD  escitalopram (LEXAPRO) 10 MG tablet Take 1 tablet by mouth daily. 12/02/13   Historical Provider, MD  HYDROcodone-acetaminophen (NORCO/VICODIN) 5-325 MG per tablet Take 1 tablet by mouth every 4 (four) hours as needed. 01/22/14   Historical Provider, MD  Phenazopyridine HCl (AZO DINE MAXIMUM STRENGTH) 97.5 MG TABS Take 1 tablet by mouth daily.    Historical Provider, MD  SPIRIVA HANDIHALER 18 MCG inhalation capsule Place 1 capsule into inhaler and inhale daily. 12/11/11   Historical Provider, MD  tetrahydrozoline 0.05 % ophthalmic solution Place 1-2 drops into both eyes daily.    Historical Provider, MD  torsemide (DEMADEX) 100 MG tablet Take 0.5 tablets  by mouth daily. 11/22/13   Historical Provider, MD  Tripelennamine HCl POWD Take 25 mg by mouth 2 (two) times daily.     Historical Provider, MD  valsartan (DIOVAN) 80 MG tablet Take 80 mg by mouth daily.      Historical Provider, MD   BP 158/88 mmHg  Pulse 119  Temp(Src) 98.4 F (36.9 C) (Oral)  Resp 24  SpO2 98% Physical Exam  Constitutional: He is oriented to person, place, and time. He appears well-developed. No distress.  HENT:  Head: Normocephalic and  atraumatic.  Eyes: Conjunctivae and EOM are normal.  Cardiovascular: Normal rate and regular rhythm.   Pulmonary/Chest: Effort normal. No stridor. No respiratory distress.  Abdominal: He exhibits no distension.  Genitourinary:     Musculoskeletal: He exhibits no edema.  Neurological: He is alert and oriented to person, place, and time.  Skin: Skin is warm and dry.  Psychiatric: He has a normal mood and affect.  Nursing note and vitals reviewed.   ED Course  Procedures (including critical care time)  Following initial evaluation, after obtaining verbal consent, we obtained a similarly sized Foley catheter, and the patient had replacement. This was performed with the assistance of our staff. Procedure was well tolerated, and the patient had production of clear/yellow urine following the procedure.  On repeat exam, prior to discharge, the patient also requests additional therapy for hemorrhoids. We discussed options for therapy, and the patient elected to try BO suppository, follow up with surgery. MDM   Final diagnoses:  Foley catheter problem, initial encounter [facility])   Vision with Charcot-Marie-Tooth syndrome presents after accidental dislodgment of his Foley catheter. Here the patient is a complaints beyond hemorrhoids. No evidence for the systemic pathology. Patient discharged after successful placement of new Foley catheter.  Carmin Muskrat, MD 06/09/15 1859

## 2015-06-09 NOTE — ED Notes (Signed)
Pt states foley came out ~30 min PTA. States severe pain. States he noticed some blood.

## 2015-06-09 NOTE — Discharge Instructions (Signed)
As discussed, for additional relief of your hemorrhoids, you may elect to use the prescribed medication. Similarly, you may choose to follow-up with our surgical specialists.  Please be sure to follow-up with your urologist, as well, for further management of your for the catheter.  Return here for concerning changes in your condition.

## 2015-06-13 ENCOUNTER — Ambulatory Visit: Payer: Medicare Other | Admitting: Urology

## 2015-07-25 ENCOUNTER — Ambulatory Visit (INDEPENDENT_AMBULATORY_CARE_PROVIDER_SITE_OTHER): Payer: Medicare Other | Admitting: Urology

## 2015-07-25 DIAGNOSIS — R339 Retention of urine, unspecified: Secondary | ICD-10-CM

## 2015-08-22 DIAGNOSIS — J449 Chronic obstructive pulmonary disease, unspecified: Secondary | ICD-10-CM | POA: Diagnosis not present

## 2015-08-22 DIAGNOSIS — G600 Hereditary motor and sensory neuropathy: Secondary | ICD-10-CM | POA: Diagnosis not present

## 2015-08-22 DIAGNOSIS — M545 Low back pain: Secondary | ICD-10-CM | POA: Diagnosis not present

## 2015-08-22 DIAGNOSIS — I10 Essential (primary) hypertension: Secondary | ICD-10-CM | POA: Diagnosis not present

## 2015-08-23 ENCOUNTER — Encounter: Payer: Self-pay | Admitting: Internal Medicine

## 2015-09-06 ENCOUNTER — Ambulatory Visit (INDEPENDENT_AMBULATORY_CARE_PROVIDER_SITE_OTHER): Payer: Medicare Other | Admitting: Urology

## 2015-09-06 DIAGNOSIS — R339 Retention of urine, unspecified: Secondary | ICD-10-CM | POA: Diagnosis not present

## 2015-09-15 ENCOUNTER — Ambulatory Visit (INDEPENDENT_AMBULATORY_CARE_PROVIDER_SITE_OTHER): Payer: Medicare Other | Admitting: Internal Medicine

## 2015-09-15 ENCOUNTER — Encounter: Payer: Self-pay | Admitting: Internal Medicine

## 2015-09-15 VITALS — BP 193/82 | HR 78 | Temp 97.1°F | Ht 65.0 in | Wt 138.4 lb

## 2015-09-15 DIAGNOSIS — K600 Acute anal fissure: Secondary | ICD-10-CM | POA: Diagnosis not present

## 2015-09-15 DIAGNOSIS — K648 Other hemorrhoids: Secondary | ICD-10-CM

## 2015-09-15 DIAGNOSIS — Z86010 Personal history of colonic polyps: Secondary | ICD-10-CM | POA: Diagnosis not present

## 2015-09-15 DIAGNOSIS — K5909 Other constipation: Secondary | ICD-10-CM

## 2015-09-15 NOTE — Patient Instructions (Signed)
Information on constipation and hemorrhoids provided  Continue daily Benefiber and stool softener  Add MiraLax 17 g orally at bedtime nightly on any day without a bowel movement  Limit total time to  5 minutes  Prescription for nitroglycerin 0.125% ointment compounded at [facility]-apply pea-sized amount to  anal canal every 6 hours as needed  Office visit with [place] in  one month.

## 2015-09-15 NOTE — Progress Notes (Signed)
Primary Care Physician:  Alonza Bogus, MD Primary Gastroenterologist:  Dr. Gala Romney  Pre-Procedure History & Physical: HPI:  Erik Atkins is a 75 y.o. male here for evaluation of the protruding hemorrhoids". Patient with Charcot-Marie-Tooth muscular dystrophy. Significant constipation chronic narcotic therapy takes Benefiber and stool softeners. Has to strain hard to have a bowel movement and 20 minutes on the commode. Took MiraLax we'll twice in the past. 2-3 bowel movements weekly. He'll cease hemorrhoids, now has pushed him back in. History of significant adenomatous colon polyps removed 2011. He did not return for is recommended 2014 follow;up. Has not had any rectal bleeding.  Patient reports C-spine surgery. He has what sounds like a neurogenic bladder. His chronically indwelling urinary catheter. Mobility is significantly diminished. This is one of the reasons he did not return for colonoscopy to  Past Medical History  Diagnosis Date  . Hypertension   . Nerve disorder 1996    "Charot Marie"  . COPD (chronic obstructive pulmonary disease) ([place])   . Charcot Marie Tooth muscular atrophy   . Neuropathy ([place])   . Anxiety   . History of pneumonia   . Tubular adenoma   . Hemorrhoids     Past Surgical History  Procedure Laterality Date  . Tonsillectomy    . Mastectomy for gynecomastia  1979 and 1981  . Cataract extraction, bilateral    . Colonoscopy  04/16/2010    Dr.Rourk- multiple cecal polyps, remainder of the colonic mucosa and rectal mucosa appeared normal. internal hemorrhoids bx= tubular adenoma  . Posterior cervical fusion/foraminotomy  02/06/2012    Procedure: POSTERIOR CERVICAL FUSION/FORAMINOTOMY LEVEL 2;  Surgeon: Ophelia Charter, MD;  Location: [facility];  Service: Neurosurgery;  Laterality: N/A;  Posterior Cervical Three-Four/Four-Five Laminectomy and Fusion with Instrumentation    Prior to Admission medications   Medication Sig Start Date End Date Taking?  Authorizing Provider  ALPRAZolam (XANAX) 0.25 MG tablet Take 0.25 mg by mouth 5 (five) times daily.     Yes Historical Provider, MD  CRANBERRY PO Take 1 tablet by mouth daily.   Yes Historical Provider, MD  escitalopram (LEXAPRO) 10 MG tablet Take 1 tablet by mouth daily. 12/02/13  Yes Historical Provider, MD  HYDROcodone-acetaminophen (NORCO/VICODIN) 5-325 MG per tablet Take 1 tablet by mouth every 4 (four) hours as needed. 01/22/14  Yes Historical Provider, MD  NON FORMULARY Potassium   OTC    One daily   Yes Historical Provider, MD  SPIRIVA HANDIHALER 18 MCG inhalation capsule Place 1 capsule into inhaler and inhale daily. 12/11/11  Yes Historical Provider, MD  torsemide (DEMADEX) 100 MG tablet Take 0.5 tablets by mouth daily. 11/22/13  Yes Historical Provider, MD  Tripelennamine HCl POWD Take 25 mg by mouth 2 (two) times daily.    Yes Historical Provider, MD  valsartan (DIOVAN) 80 MG tablet Take 80 mg by mouth daily.     Yes Historical Provider, MD  belladonna-opium (B&O SUPPRETTES) 16.2-30 MG suppository Place 1 suppository rectally 2 (two) times daily as needed for pain. Patient not taking: Reported on 09/15/2015 06/09/15   Carmin Muskrat, MD  ciprofloxacin (CIPRO) 500 MG tablet Take 1 tablet (500 mg total) by mouth every 12 (twelve) hours. Patient not taking: Reported on 09/15/2015 02/03/14   Tanna Furry, MD  ciprofloxacin (CIPRO) 500 MG tablet Take 1 tablet (500 mg total) by mouth every 12 (twelve) hours. Patient not taking: Reported on 09/15/2015 02/03/14   Tanna Furry, MD  Phenazopyridine HCl (AZO DINE MAXIMUM STRENGTH)  97.5 MG TABS Take 1 tablet by mouth daily. Reported on 09/15/2015    Historical Provider, MD  tetrahydrozoline 0.05 % ophthalmic solution Place 1-2 drops into both eyes daily. Reported on 09/15/2015    Historical Provider, MD    Allergies as of 09/15/2015 - Review Complete 09/15/2015  Allergen Reaction Noted  . Penicillins  08/09/2010  . Darvon Swelling 06/03/2011  . Celexa  [citalopram hydrobromide] Nausea And Vomiting 01/31/2012  . Cortisone Swelling 08/09/2010  . Cymbalta [duloxetine hcl] Nausea Only 01/31/2012  . Eggs or egg-derived products Nausea And Vomiting 01/31/2012  . Guaifenesin er  08/09/2010  . Keflex [cephalexin] Swelling 01/31/2012  . Motrin [ibuprofen]  08/09/2010  . Pumpkin seed Nausea And Vomiting 01/31/2012  . Sweet potato Nausea And Vomiting 01/31/2012  . Tea Nausea And Vomiting 01/31/2012  . Zoloft [sertraline] Nausea Only 01/31/2012    No family history on file.  Social History   Social History  . Marital Status: Married    Spouse Name: N/A  . Number of Children: N/A  . Years of Education: N/A   Occupational History  . Not on file.   Social History Main Topics  . Smoking status: Former Smoker -- 0.50 packs/day for 50 years    Types: Cigarettes  . Smokeless tobacco: Never Used     Comment: uses e cigs  . Alcohol Use: No  . Drug Use: No  . Sexual Activity: Not on file   Other Topics Concern  . Not on file   Social History Narrative    Review of Systems: See HPI, otherwise negative ROS  Physical Exam: BP 193/82 mmHg  Pulse 78  Temp(Src) 97.1 F (36.2 C) (Oral)  Ht 5\' 5"  (1.651 m)  Wt 138 lb 6.4 oz (62.778 kg)  BMI 23.03 kg/m2 General:   Chronically ill-appearing gentleman in no acute distress. Accompanied by spouse. pleasant and cooperative in NAD Skin:  Intact without significant lesions or rashes. Eyes:  Sclera clear, no icterus.   Conjunctiva pink. Lungs:  Clear throughout to auscultation.   No wheezes, crackles, or rhonchi. No acute distress. Heart:  Regular rate and rhythm; no murmurs, clicks, rubs,  or gallops. Abdomen: Non-distended, normal bowel sounds.  Soft and nontender without appreciable mass or hepatosplenomegaly.  Pulses:  Normal pulses noted. Extremities:  Without clubbing or edema. Rectal exam: no external lesions. He does have erythematous buttocks. Digital rectal exam was moderately  tender. I did not appreciate a mass. Rectal vault empty.  Impression:  Debilitated 75 year old gentleman history of multiple colonic adenomas -  overdue for followup;  he presents with the report of protruding hemorrhoids. His anal canal is exquisitely tender today. I do not see any protruding hemorrhoids. I wonder if he has an anal fissure.   I told he and his wife that successful treatment of constipation and symptomatic hemorrhoids go hand in hand. We need to successfully deal with constipation in concert with treating the hemorrhoids. He asked if a band can be placed today. I told him we would go ahead and place a single band and get him on a bowel regimen including stool softener,  Benefiber and adding MiraLax 17 g orally at bedtime daily when necessary if no bowel movement is achieved on any given day.  Ideally, He should have one more surveillance colonoscopy. This may not be feasible because of his physical limitations. Will discuss this further with the patient later in the year.  [facility] banding note:  Anoscopy performed. Prominent right anterior hemorrhoid column;  no other abnormality seen. I placed [facility] banding device and engaged the right anterior hemorrhoid;  the patient had exquisite pain, in fact, exquisite pain with any manipulation of the anal canal. I suspect a fissure. I decided not to place a band. The hemorrhoid was released. Did not see any thing suspicious for perirectal ab   Recommendations:   information on constipation and hemorrhoids provided  Continue daily Benefiber and stool softener  Add MiraLax 17 g orally at bedtime nightly on any day without a bowel movement  Limit total time to  5 minutes  Prescription for nitroglycerin 0.125% ointment compounded at [facility]-apply pea-sized amount to  anal canal every 6 hours as needed  Office visit with [place] in  one month.        Notice: This dictation was prepared with Dragon dictation along with smaller  phrase technology. Any transcriptional errors that result from this process are unintentional and may not be corrected upon review.

## 2015-10-13 ENCOUNTER — Ambulatory Visit (INDEPENDENT_AMBULATORY_CARE_PROVIDER_SITE_OTHER): Payer: Medicare Other | Admitting: Internal Medicine

## 2015-10-13 ENCOUNTER — Encounter: Payer: Self-pay | Admitting: Internal Medicine

## 2015-10-13 VITALS — BP 150/70 | HR 98 | Temp 97.6°F | Ht 65.0 in | Wt 133.6 lb

## 2015-10-13 DIAGNOSIS — K921 Melena: Secondary | ICD-10-CM

## 2015-10-13 DIAGNOSIS — K5903 Drug induced constipation: Secondary | ICD-10-CM

## 2015-10-13 DIAGNOSIS — T402X5A Adverse effect of other opioids, initial encounter: Secondary | ICD-10-CM

## 2015-10-13 NOTE — Patient Instructions (Signed)
Avoid straining.  Continue Benefiber 1 tablespoon twice daily  Continue stool softer and MiraLax daily as needed to facilitate bowel function  Limit toilet time to 62minutes  Call with any interim problems  Schedule followup appointment in 6 weeks

## 2015-10-15 NOTE — Progress Notes (Signed)
Primary Care Physician:  Alonza Bogus, MD Primary Gastroenterologist:  Dr. Gala Romney  Pre-Procedure History & Physical: HPI:  Erik Atkins is a 75 y.o. male here for follow-up.   He was not tolerant of an attempt at placing a hemorrhoid band at the last office visit. However, since that time, utilizing MiraLax,  Benefiber and nitroglycerin cream topically,  his bleeding has ceased and his bowel function has improved dramatically. He is quite pleased. Chronic opioid therapy. Has Charcot-Marie-Tooth muscular dystrophy.   Past Medical History  Diagnosis Date  . Hypertension   . Nerve disorder 1996    "Charot Marie"  . COPD (chronic obstructive pulmonary disease) ([place])   . Charcot Marie Tooth muscular atrophy   . Neuropathy ([place])   . Anxiety   . History of pneumonia   . Tubular adenoma   . Hemorrhoids     Past Surgical History  Procedure Laterality Date  . Tonsillectomy    . Mastectomy for gynecomastia  1979 and 1981  . Cataract extraction, bilateral    . Colonoscopy  04/16/2010    Dr.Rourk- multiple cecal polyps, remainder of the colonic mucosa and rectal mucosa appeared normal. internal hemorrhoids bx= tubular adenoma  . Posterior cervical fusion/foraminotomy  02/06/2012    Procedure: POSTERIOR CERVICAL FUSION/FORAMINOTOMY LEVEL 2;  Surgeon: Ophelia Charter, MD;  Location: [facility];  Service: Neurosurgery;  Laterality: N/A;  Posterior Cervical Three-Four/Four-Five Laminectomy and Fusion with Instrumentation    Prior to Admission medications   Medication Sig Start Date End Date Taking? Authorizing Provider  ALPRAZolam (XANAX) 0.25 MG tablet Take 0.25 mg by mouth 5 (five) times daily.     Yes Historical Provider, MD  belladonna-opium (B&O SUPPRETTES) 16.2-30 MG suppository Place 1 suppository rectally 2 (two) times daily as needed for pain. 06/09/15  Yes Carmin Muskrat, MD  ciprofloxacin (CIPRO) 500 MG tablet Take 1 tablet (500 mg total) by mouth every 12 (twelve) hours.  02/03/14  Yes Tanna Furry, MD  ciprofloxacin (CIPRO) 500 MG tablet Take 1 tablet (500 mg total) by mouth every 12 (twelve) hours. 02/03/14  Yes Tanna Furry, MD  CRANBERRY PO Take 1 tablet by mouth daily.   Yes Historical Provider, MD  escitalopram (LEXAPRO) 10 MG tablet Take 1 tablet by mouth daily. 12/02/13  Yes Historical Provider, MD  HYDROcodone-acetaminophen (NORCO/VICODIN) 5-325 MG per tablet Take 1 tablet by mouth every 4 (four) hours as needed. 01/22/14  Yes Historical Provider, MD  NON FORMULARY Potassium   OTC    One daily   Yes Historical Provider, MD  Phenazopyridine HCl (AZO DINE MAXIMUM STRENGTH) 97.5 MG TABS Take 1 tablet by mouth daily. Reported on 09/15/2015   Yes Historical Provider, MD  SPIRIVA HANDIHALER 18 MCG inhalation capsule Place 1 capsule into inhaler and inhale daily. 12/11/11  Yes Historical Provider, MD  tetrahydrozoline 0.05 % ophthalmic solution Place 1-2 drops into both eyes daily. Reported on 09/15/2015   Yes Historical Provider, MD  torsemide (DEMADEX) 100 MG tablet Take 0.5 tablets by mouth daily. 11/22/13  Yes Historical Provider, MD  Tripelennamine HCl POWD Take 25 mg by mouth 2 (two) times daily.    Yes Historical Provider, MD  valsartan (DIOVAN) 80 MG tablet Take 80 mg by mouth daily.     Yes Historical Provider, MD    Allergies as of 10/13/2015 - Review Complete 10/13/2015  Allergen Reaction Noted  . Penicillins  08/09/2010  . Darvon Swelling 06/03/2011  . Celexa [citalopram hydrobromide] Nausea And Vomiting 01/31/2012  .  Cortisone Swelling 08/09/2010  . Cymbalta [duloxetine hcl] Nausea Only 01/31/2012  . Eggs or egg-derived products Nausea And Vomiting 01/31/2012  . Guaifenesin er  08/09/2010  . Keflex [cephalexin] Swelling 01/31/2012  . Motrin [ibuprofen]  08/09/2010  . Pumpkin seed Nausea And Vomiting 01/31/2012  . Sweet potato Nausea And Vomiting 01/31/2012  . Tea Nausea And Vomiting 01/31/2012  . Zoloft [sertraline] Nausea Only 01/31/2012    No family  history on file.  Social History   Social History  . Marital Status: Married    Spouse Name: N/A  . Number of Children: N/A  . Years of Education: N/A   Occupational History  . Not on file.   Social History Main Topics  . Smoking status: Former Smoker -- 0.50 packs/day for 50 years    Types: Cigarettes  . Smokeless tobacco: Never Used     Comment: uses e cigs  . Alcohol Use: No  . Drug Use: No  . Sexual Activity: Not on file   Other Topics Concern  . Not on file   Social History Narrative    Review of Systems: See HPI, otherwise negative ROS  Physical Exam: BP 150/70 mmHg  Pulse 98  Temp(Src) 97.6 F (36.4 C) (Oral)  Ht 5\' 5"  (1.651 m)  Wt 133 lb 9.6 oz (60.601 kg)  BMI 22.23 kg/m2 General:   chronically ill gentleman pleasant and cooperative in NAD. Accompanied by his wife to Lungs:  Clear throughout to auscultation.   No wheezes, crackles, or rhonchi. No acute distress. Heart:  Regular rate and rhythm; no murmurs, clicks, rubs,  or gallops. Abdomen: Non-distended, normal bowel sounds.  Soft and nontender without appreciable mass or hepatosplenomegaly.  Pulses:  Normal pulses noted. Extremities:  Without clubbing or edema. Rectal:  No external lesions. Rectal exam non-tender without appreciable mass   Impression:  Pleasant 75 year old gentleman with hematochezia likely hemorrhoids and intermittently symptomatic anal fissure in the setting of opioid-induced constipation and likely diminished motility/mobility secondary to his coexisting neurological problems. He is doing very well at this time on the above-mentioned regimen. No hemorrhoid banding or further intervention needed.     Recommendations:  Avoid straining.  May continue using nitroglycerin cream as needed  Continue Benefiber 1 tablespoon twice daily  Continue stool softer and MiraLax daily as needed to facilitate bowel function  Limit toilet time to 5 minutes  Call with any interim  problems  Schedule followup appointment in 6 weeks      Notice: This dictation was prepared with Dragon dictation along with smaller phrase technology. Any transcriptional errors that result from this process are unintentional and may not be corrected upon review.

## 2015-10-16 ENCOUNTER — Other Ambulatory Visit: Payer: Self-pay | Admitting: Internal Medicine

## 2015-10-17 ENCOUNTER — Ambulatory Visit (INDEPENDENT_AMBULATORY_CARE_PROVIDER_SITE_OTHER): Payer: Medicare Other | Admitting: Urology

## 2015-10-17 DIAGNOSIS — R339 Retention of urine, unspecified: Secondary | ICD-10-CM | POA: Diagnosis not present

## 2015-11-17 ENCOUNTER — Encounter: Payer: Self-pay | Admitting: Internal Medicine

## 2015-11-17 ENCOUNTER — Ambulatory Visit (INDEPENDENT_AMBULATORY_CARE_PROVIDER_SITE_OTHER): Payer: Medicare Other | Admitting: Internal Medicine

## 2015-11-17 VITALS — BP 170/68 | HR 74 | Temp 96.7°F | Ht 65.0 in | Wt 133.8 lb

## 2015-11-17 DIAGNOSIS — K602 Anal fissure, unspecified: Secondary | ICD-10-CM

## 2015-11-17 DIAGNOSIS — K5909 Other constipation: Secondary | ICD-10-CM

## 2015-11-17 NOTE — Patient Instructions (Signed)
Continue miralax daily as needed  Continue Benefiber every day  Use NTG cream as needed  No future colonosocopy as discussed unless new symptoms arise  OV 1 year

## 2015-11-17 NOTE — Progress Notes (Signed)
Primary Care Physician:  Alonza Bogus, MD Primary Gastroenterologist:  Dr. Gala Romney  Pre-Procedure History & Physical: HPI:  Erik Atkins is a 75 y.o. male here for problem constipation rectal bleeding/probable anal fissure. Since last being seen, he is taking the Benefiber and MiraLax religiously. Protrusion/ bleeding and discomfort have subsided. Bowel function has improved. He feels he is doing very well atthis point in time from a GI standpoint. He is accompanied by his wife.  Had multiple colonic adenomas removed from his colon in 2011; one more surveillance colonoscopy at this time would not be unreasonable. However, patient states he does not desire another colonoscopy. Does not think he can tolerate the preparation. I discussed risks of not having another colonoscopy with him.  Past Medical History  Diagnosis Date  . Hypertension   . Nerve disorder 1996    "Charot Marie"  . COPD (chronic obstructive pulmonary disease) ([place])   . Charcot Marie Tooth muscular atrophy   . Neuropathy ([place])   . Anxiety   . History of pneumonia   . Tubular adenoma   . Hemorrhoids     Past Surgical History  Procedure Laterality Date  . Tonsillectomy    . Mastectomy for gynecomastia  1979 and 1981  . Cataract extraction, bilateral    . Colonoscopy  04/16/2010    Dr.Rourk- multiple cecal polyps, remainder of the colonic mucosa and rectal mucosa appeared normal. internal hemorrhoids bx= tubular adenoma  . Posterior cervical fusion/foraminotomy  02/06/2012    Procedure: POSTERIOR CERVICAL FUSION/FORAMINOTOMY LEVEL 2;  Surgeon: Ophelia Charter, MD;  Location: [facility];  Service: Neurosurgery;  Laterality: N/A;  Posterior Cervical Three-Four/Four-Five Laminectomy and Fusion with Instrumentation    Prior to Admission medications   Medication Sig Start Date End Date Taking? Authorizing Provider  ALPRAZolam (XANAX) 0.25 MG tablet Take 0.25 mg by mouth 5 (five) times daily.     Yes Historical  Provider, MD  ciprofloxacin (CIPRO) 500 MG tablet Take 1 tablet (500 mg total) by mouth every 12 (twelve) hours. 02/03/14  Yes Tanna Furry, MD  ciprofloxacin (CIPRO) 500 MG tablet Take 1 tablet (500 mg total) by mouth every 12 (twelve) hours. 02/03/14  Yes Tanna Furry, MD  CRANBERRY PO Take 1 tablet by mouth daily.   Yes Historical Provider, MD  escitalopram (LEXAPRO) 10 MG tablet Take 1 tablet by mouth daily. 12/02/13  Yes Historical Provider, MD  HYDROcodone-acetaminophen (NORCO/VICODIN) 5-325 MG per tablet Take 1 tablet by mouth every 4 (four) hours as needed. 01/22/14  Yes Historical Provider, MD  NITRO-BID 2 % ointment APPLY A PEA SIZE AMOUNT TO THE ANAL CAVITY EVERY 6 HOURS AS NEEDED. 10/17/15  Yes Orvil Feil, NP  NON FORMULARY Potassium   OTC    One daily   Yes Historical Provider, MD  Phenazopyridine HCl (AZO DINE MAXIMUM STRENGTH) 97.5 MG TABS Take 1 tablet by mouth daily. Reported on 09/15/2015   Yes Historical Provider, MD  SPIRIVA HANDIHALER 18 MCG inhalation capsule Place 1 capsule into inhaler and inhale daily. 12/11/11  Yes Historical Provider, MD  tetrahydrozoline 0.05 % ophthalmic solution Place 1-2 drops into both eyes daily. Reported on 09/15/2015   Yes Historical Provider, MD  torsemide (DEMADEX) 100 MG tablet Take 0.5 tablets by mouth daily. 11/22/13  Yes Historical Provider, MD  Tripelennamine HCl POWD Take 25 mg by mouth 2 (two) times daily.    Yes Historical Provider, MD  valsartan (DIOVAN) 80 MG tablet Take 80 mg by mouth daily.  Yes Historical Provider, MD  belladonna-opium (B&O SUPPRETTES) 16.2-30 MG suppository Place 1 suppository rectally 2 (two) times daily as needed for pain. Patient not taking: Reported on 11/17/2015 06/09/15   Carmin Muskrat, MD    Allergies as of 11/17/2015 - Review Complete 11/17/2015  Allergen Reaction Noted  . Penicillins  08/09/2010  . Darvon Swelling 06/03/2011  . Celexa [citalopram hydrobromide] Nausea And Vomiting 01/31/2012  . Cortisone  Swelling 08/09/2010  . Cymbalta [duloxetine hcl] Nausea Only 01/31/2012  . Eggs or egg-derived products Nausea And Vomiting 01/31/2012  . Guaifenesin er  08/09/2010  . Keflex [cephalexin] Swelling 01/31/2012  . Motrin [ibuprofen]  08/09/2010  . Pumpkin seed Nausea And Vomiting 01/31/2012  . Sweet potato Nausea And Vomiting 01/31/2012  . Tea Nausea And Vomiting 01/31/2012  . Zoloft [sertraline] Nausea Only 01/31/2012    No family history on file.  Social History   Social History  . Marital Status: Married    Spouse Name: N/A  . Number of Children: N/A  . Years of Education: N/A   Occupational History  . Not on file.   Social History Main Topics  . Smoking status: Former Smoker -- 0.50 packs/day for 50 years    Types: Cigarettes  . Smokeless tobacco: Never Used     Comment: uses e cigs  . Alcohol Use: No  . Drug Use: No  . Sexual Activity: Not on file   Other Topics Concern  . Not on file   Social History Narrative    Review of Systems: See HPI, otherwise negative ROS  Physical Exam: BP 170/68 mmHg  Pulse 74  Temp(Src) 96.7 F (35.9 C)  Ht 5\' 5"  (1.651 m)  Wt 133 lb 12.8 oz (60.691 kg)  BMI 22.27 kg/m2 General:   Alert,  pleasant and cooperative in NAD Mouth:  No deformity or lesions. Neck:  Supple; no masses or thyromegaly. No significant cervical adenopathy. Lungs:  Clear throughout to auscultation.   No wheezes, crackles, or rhonchi. No acute distress. Heart:  Regular rate and rhythm; no murmurs, clicks, rubs,  or gallops. Abdomen: Non-distended, normal bowel sounds.  Soft and nontender without appreciable mass or hepatosplenomegaly.   Impression:  Debilitated 75 year old gentleman with a history of anorectal bleeding/ itching and history of colonic adenoma. Clinically,doing well with topical measures and current bowel regimen. I suspect he had an anal fissure with the hemorrhoids -  being a minor contributing factor. He needs no further intervention aside  from a bowel regimen he is currently taking. He does not want another colonoscopy. I do not feel this is unreasonable at all at this time.  Recommendations: Continue Benefiber every day  Use NTG cream as needed  No future colonosocopy as discussed unless new symptoms arise  OV 1 year    Notice: This dictation was prepared with Dragon dictation along with smaller phrase technology. Any transcriptional errors that result from this process are unintentional and may not be corrected upon review. Continue miralax daily as needed

## 2015-11-23 DIAGNOSIS — Z0000 Encounter for general adult medical examination without abnormal findings: Secondary | ICD-10-CM | POA: Diagnosis not present

## 2015-11-29 DIAGNOSIS — J449 Chronic obstructive pulmonary disease, unspecified: Secondary | ICD-10-CM | POA: Diagnosis not present

## 2015-11-29 DIAGNOSIS — I10 Essential (primary) hypertension: Secondary | ICD-10-CM | POA: Diagnosis not present

## 2015-11-29 DIAGNOSIS — Z0000 Encounter for general adult medical examination without abnormal findings: Secondary | ICD-10-CM | POA: Diagnosis not present

## 2015-11-29 DIAGNOSIS — D72829 Elevated white blood cell count, unspecified: Secondary | ICD-10-CM | POA: Diagnosis not present

## 2015-12-01 ENCOUNTER — Ambulatory Visit (INDEPENDENT_AMBULATORY_CARE_PROVIDER_SITE_OTHER): Payer: Medicare Other | Admitting: Urology

## 2015-12-01 DIAGNOSIS — R339 Retention of urine, unspecified: Secondary | ICD-10-CM | POA: Diagnosis not present

## 2015-12-12 DIAGNOSIS — E875 Hyperkalemia: Secondary | ICD-10-CM | POA: Diagnosis not present

## 2016-01-09 ENCOUNTER — Ambulatory Visit (INDEPENDENT_AMBULATORY_CARE_PROVIDER_SITE_OTHER): Payer: Medicare Other | Admitting: Urology

## 2016-01-09 DIAGNOSIS — R339 Retention of urine, unspecified: Secondary | ICD-10-CM | POA: Diagnosis not present

## 2016-02-20 ENCOUNTER — Ambulatory Visit (INDEPENDENT_AMBULATORY_CARE_PROVIDER_SITE_OTHER): Payer: Medicare Other | Admitting: Urology

## 2016-02-20 DIAGNOSIS — R339 Retention of urine, unspecified: Secondary | ICD-10-CM

## 2016-04-02 ENCOUNTER — Ambulatory Visit (INDEPENDENT_AMBULATORY_CARE_PROVIDER_SITE_OTHER): Payer: Medicare Other | Admitting: Urology

## 2016-04-02 DIAGNOSIS — R3914 Feeling of incomplete bladder emptying: Secondary | ICD-10-CM

## 2016-05-14 ENCOUNTER — Ambulatory Visit (INDEPENDENT_AMBULATORY_CARE_PROVIDER_SITE_OTHER): Payer: Medicare Other | Admitting: Urology

## 2016-05-14 DIAGNOSIS — R339 Retention of urine, unspecified: Secondary | ICD-10-CM

## 2016-06-24 ENCOUNTER — Emergency Department (HOSPITAL_COMMUNITY): Payer: Medicare Other

## 2016-06-24 ENCOUNTER — Encounter (HOSPITAL_COMMUNITY): Payer: Self-pay | Admitting: *Deleted

## 2016-06-24 ENCOUNTER — Inpatient Hospital Stay (HOSPITAL_COMMUNITY)
Admission: EM | Admit: 2016-06-24 | Discharge: 2016-07-06 | DRG: 871 | Disposition: A | Discharge status: Skilled Nursing Facility | Payer: Medicare Other | Attending: Pulmonary Disease | Admitting: Pulmonary Disease

## 2016-06-24 DIAGNOSIS — G9341 Metabolic encephalopathy: Secondary | ICD-10-CM | POA: Diagnosis not present

## 2016-06-24 DIAGNOSIS — B379 Candidiasis, unspecified: Secondary | ICD-10-CM | POA: Diagnosis present

## 2016-06-24 DIAGNOSIS — Z87891 Personal history of nicotine dependence: Secondary | ICD-10-CM

## 2016-06-24 DIAGNOSIS — I130 Hypertensive heart and chronic kidney disease with heart failure and stage 1 through stage 4 chronic kidney disease, or unspecified chronic kidney disease: Secondary | ICD-10-CM | POA: Diagnosis present

## 2016-06-24 DIAGNOSIS — Z79899 Other long term (current) drug therapy: Secondary | ICD-10-CM

## 2016-06-24 DIAGNOSIS — Z981 Arthrodesis status: Secondary | ICD-10-CM

## 2016-06-24 DIAGNOSIS — N390 Urinary tract infection, site not specified: Secondary | ICD-10-CM

## 2016-06-24 DIAGNOSIS — L8990 Pressure ulcer of unspecified site, unspecified stage: Secondary | ICD-10-CM | POA: Diagnosis present

## 2016-06-24 DIAGNOSIS — Z886 Allergy status to analgesic agent status: Secondary | ICD-10-CM

## 2016-06-24 DIAGNOSIS — Z888 Allergy status to other drugs, medicaments and biological substances status: Secondary | ICD-10-CM

## 2016-06-24 DIAGNOSIS — I509 Heart failure, unspecified: Secondary | ICD-10-CM | POA: Diagnosis not present

## 2016-06-24 DIAGNOSIS — R0602 Shortness of breath: Secondary | ICD-10-CM

## 2016-06-24 DIAGNOSIS — Z91012 Allergy to eggs: Secondary | ICD-10-CM

## 2016-06-24 DIAGNOSIS — K649 Unspecified hemorrhoids: Secondary | ICD-10-CM | POA: Diagnosis not present

## 2016-06-24 DIAGNOSIS — T83028A Displacement of other indwelling urethral catheter, initial encounter: Secondary | ICD-10-CM | POA: Diagnosis not present

## 2016-06-24 DIAGNOSIS — G710 Muscular dystrophy: Secondary | ICD-10-CM | POA: Diagnosis not present

## 2016-06-24 DIAGNOSIS — I4891 Unspecified atrial fibrillation: Secondary | ICD-10-CM | POA: Diagnosis not present

## 2016-06-24 DIAGNOSIS — K4090 Unilateral inguinal hernia, without obstruction or gangrene, not specified as recurrent: Secondary | ICD-10-CM | POA: Diagnosis not present

## 2016-06-24 DIAGNOSIS — R109 Unspecified abdominal pain: Secondary | ICD-10-CM

## 2016-06-24 DIAGNOSIS — R0600 Dyspnea, unspecified: Secondary | ICD-10-CM | POA: Diagnosis not present

## 2016-06-24 DIAGNOSIS — N319 Neuromuscular dysfunction of bladder, unspecified: Secondary | ICD-10-CM | POA: Diagnosis not present

## 2016-06-24 DIAGNOSIS — Z881 Allergy status to other antibiotic agents status: Secondary | ICD-10-CM

## 2016-06-24 DIAGNOSIS — G600 Hereditary motor and sensory neuropathy: Secondary | ICD-10-CM | POA: Diagnosis present

## 2016-06-24 DIAGNOSIS — L03115 Cellulitis of right lower limb: Secondary | ICD-10-CM | POA: Diagnosis not present

## 2016-06-24 DIAGNOSIS — K5900 Constipation, unspecified: Secondary | ICD-10-CM | POA: Diagnosis present

## 2016-06-24 DIAGNOSIS — Z9289 Personal history of other medical treatment: Secondary | ICD-10-CM

## 2016-06-24 DIAGNOSIS — N189 Chronic kidney disease, unspecified: Secondary | ICD-10-CM | POA: Diagnosis present

## 2016-06-24 DIAGNOSIS — Z960 Presence of urogenital implants: Secondary | ICD-10-CM

## 2016-06-24 DIAGNOSIS — X58XXXA Exposure to other specified factors, initial encounter: Secondary | ICD-10-CM | POA: Diagnosis present

## 2016-06-24 DIAGNOSIS — J441 Chronic obstructive pulmonary disease with (acute) exacerbation: Secondary | ICD-10-CM | POA: Diagnosis not present

## 2016-06-24 DIAGNOSIS — H539 Unspecified visual disturbance: Secondary | ICD-10-CM

## 2016-06-24 DIAGNOSIS — K6289 Other specified diseases of anus and rectum: Secondary | ICD-10-CM

## 2016-06-24 DIAGNOSIS — Z885 Allergy status to narcotic agent status: Secondary | ICD-10-CM

## 2016-06-24 DIAGNOSIS — G8929 Other chronic pain: Secondary | ICD-10-CM | POA: Diagnosis present

## 2016-06-24 DIAGNOSIS — E46 Unspecified protein-calorie malnutrition: Secondary | ICD-10-CM | POA: Diagnosis not present

## 2016-06-24 DIAGNOSIS — Z79891 Long term (current) use of opiate analgesic: Secondary | ICD-10-CM

## 2016-06-24 DIAGNOSIS — F419 Anxiety disorder, unspecified: Secondary | ICD-10-CM | POA: Diagnosis present

## 2016-06-24 DIAGNOSIS — R1310 Dysphagia, unspecified: Secondary | ICD-10-CM | POA: Diagnosis not present

## 2016-06-24 DIAGNOSIS — A419 Sepsis, unspecified organism: Principal | ICD-10-CM | POA: Diagnosis present

## 2016-06-24 DIAGNOSIS — K1230 Oral mucositis (ulcerative), unspecified: Secondary | ICD-10-CM | POA: Diagnosis not present

## 2016-06-24 DIAGNOSIS — M6281 Muscle weakness (generalized): Secondary | ICD-10-CM

## 2016-06-24 DIAGNOSIS — T82898A Other specified complication of vascular prosthetic devices, implants and grafts, initial encounter: Secondary | ICD-10-CM | POA: Diagnosis not present

## 2016-06-24 DIAGNOSIS — I10 Essential (primary) hypertension: Secondary | ICD-10-CM | POA: Diagnosis present

## 2016-06-24 DIAGNOSIS — Z91018 Allergy to other foods: Secondary | ICD-10-CM

## 2016-06-24 DIAGNOSIS — Z978 Presence of other specified devices: Secondary | ICD-10-CM

## 2016-06-24 LAB — URINALYSIS, ROUTINE W REFLEX MICROSCOPIC
GLUCOSE, UA: 100 mg/dL — AB
KETONES UR: NEGATIVE mg/dL
NITRITE: NEGATIVE
PH: 6.5 (ref 5.0–8.0)
Protein, ur: >300 mg/dL — AB
SPECIFIC GRAVITY, URINE: 1.02 (ref 1.005–1.030)

## 2016-06-24 LAB — COMPREHENSIVE METABOLIC PANEL
ALK PHOS: 68 U/L (ref 38–126)
ALT: 9 U/L — ABNORMAL LOW (ref 17–63)
ANION GAP: 12 (ref 5–15)
AST: 17 U/L (ref 15–41)
Albumin: 3.3 g/dL — ABNORMAL LOW (ref 3.5–5.0)
BILIRUBIN TOTAL: 0.8 mg/dL (ref 0.3–1.2)
BUN: 30 mg/dL — ABNORMAL HIGH (ref 6–20)
CALCIUM: 8.5 mg/dL — AB (ref 8.9–10.3)
CO2: 20 mmol/L — ABNORMAL LOW (ref 22–32)
Chloride: 102 mmol/L (ref 101–111)
Creatinine, Ser: 1.57 mg/dL — ABNORMAL HIGH (ref 0.61–1.24)
GFR calc Af Amer: 47 mL/min — ABNORMAL LOW (ref >60)
GFR calc non Af Amer: 41 mL/min — ABNORMAL LOW (ref >60)
GLUCOSE: 107 mg/dL — AB (ref 65–99)
Potassium: 4.1 mmol/L (ref 3.5–5.1)
Sodium: 134 mmol/L — ABNORMAL LOW (ref 135–145)
Total Protein: 7.5 g/dL (ref 6.5–8.1)

## 2016-06-24 LAB — CBC WITH DIFFERENTIAL/PLATELET
BASOS PCT: 0 %
Basophils Absolute: 0 10*3/uL (ref 0.0–0.1)
EOS ABS: 0.3 10*3/uL (ref 0.0–0.7)
Eosinophils Relative: 2 %
HCT: 34.1 % — ABNORMAL LOW (ref 39.0–52.0)
HEMOGLOBIN: 11.1 g/dL — AB (ref 13.0–17.0)
Lymphocytes Relative: 9 %
Lymphs Abs: 1.7 10*3/uL (ref 0.7–4.0)
MCH: 32.8 pg (ref 26.0–34.0)
MCHC: 32.6 g/dL (ref 30.0–36.0)
MCV: 100.9 fL — ABNORMAL HIGH (ref 78.0–100.0)
Monocytes Absolute: 1 10*3/uL (ref 0.1–1.0)
Monocytes Relative: 6 %
NEUTROS PCT: 83 %
Neutro Abs: 14.7 10*3/uL — ABNORMAL HIGH (ref 1.7–7.7)
Platelets: 288 10*3/uL (ref 150–400)
RBC: 3.38 MIL/uL — ABNORMAL LOW (ref 4.22–5.81)
RDW: 12 % (ref 11.5–15.5)
WBC: 17.7 10*3/uL — AB (ref 4.0–10.5)

## 2016-06-24 LAB — URINALYSIS, MICROSCOPIC (REFLEX): Squamous Epithelial / LPF: NONE SEEN

## 2016-06-24 LAB — I-STAT CG4 LACTIC ACID, ED
Lactic Acid, Venous: 2.54 mmol/L (ref 0.5–1.9)
Lactic Acid, Venous: 1.34 mmol/L (ref 0.5–1.9)

## 2016-06-24 LAB — LACTIC ACID, PLASMA: Lactic Acid, Venous: 2 mmol/L (ref 0.5–1.9)

## 2016-06-24 MED ORDER — SODIUM CHLORIDE 0.9 % IV SOLN
INTRAVENOUS | Status: DC
  Administered 2016-06-24 – 2016-07-05 (×3): via INTRAVENOUS

## 2016-06-24 MED ORDER — ALPRAZOLAM 0.25 MG PO TABS
0.2500 mg | ORAL_TABLET | Freq: Every day | ORAL | Status: DC
  Administered 2016-06-25 – 2016-07-05 (×44): 0.25 mg via ORAL
  Filled 2016-06-24 (×48): qty 1

## 2016-06-24 MED ORDER — CLINDAMYCIN PHOSPHATE 600 MG/50ML IV SOLN
600.0000 mg | Freq: Three times a day (TID) | INTRAVENOUS | Status: DC
  Administered 2016-06-24 – 2016-07-02 (×26): 600 mg via INTRAVENOUS
  Filled 2016-06-24 (×29): qty 50

## 2016-06-24 MED ORDER — DOCUSATE SODIUM 100 MG PO CAPS
100.0000 mg | ORAL_CAPSULE | Freq: Two times a day (BID) | ORAL | Status: DC
  Administered 2016-06-24 – 2016-07-06 (×20): 100 mg via ORAL
  Filled 2016-06-24 (×23): qty 1

## 2016-06-24 MED ORDER — TIOTROPIUM BROMIDE MONOHYDRATE 18 MCG IN CAPS
1.0000 | ORAL_CAPSULE | Freq: Every day | RESPIRATORY_TRACT | Status: DC
  Administered 2016-06-25 – 2016-07-05 (×9): 18 ug via RESPIRATORY_TRACT
  Filled 2016-06-24 (×4): qty 5

## 2016-06-24 MED ORDER — ACETAMINOPHEN 325 MG PO TABS
650.0000 mg | ORAL_TABLET | Freq: Four times a day (QID) | ORAL | Status: DC | PRN
  Administered 2016-07-05: 650 mg via ORAL
  Filled 2016-06-24 (×3): qty 2

## 2016-06-24 MED ORDER — HYDROCODONE-ACETAMINOPHEN 5-325 MG PO TABS
1.0000 | ORAL_TABLET | ORAL | Status: DC | PRN
  Administered 2016-06-24 – 2016-07-06 (×32): 1 via ORAL
  Filled 2016-06-24 (×32): qty 1

## 2016-06-24 MED ORDER — POLYETHYLENE GLYCOL 3350 17 G PO PACK
17.0000 g | PACK | Freq: Every day | ORAL | Status: DC
  Administered 2016-06-24 – 2016-07-06 (×4): 17 g via ORAL
  Filled 2016-06-24 (×13): qty 1

## 2016-06-24 MED ORDER — IOPAMIDOL (ISOVUE-300) INJECTION 61%
INTRAVENOUS | Status: AC
  Filled 2016-06-24: qty 30

## 2016-06-24 MED ORDER — CIPROFLOXACIN IN D5W 400 MG/200ML IV SOLN
400.0000 mg | Freq: Once | INTRAVENOUS | Status: AC
  Administered 2016-06-24: 400 mg via INTRAVENOUS
  Filled 2016-06-24: qty 200

## 2016-06-24 MED ORDER — PHENAZOPYRIDINE HCL 97.5 MG PO TABS: 1.0000 | ORAL_TABLET | Freq: Every day | ORAL | Status: DC

## 2016-06-24 MED ORDER — VANCOMYCIN HCL IN DEXTROSE 1-5 GM/200ML-% IV SOLN: 1000.0000 mg | INTRAVENOUS | Status: DC

## 2016-06-24 MED ORDER — ONDANSETRON HCL 4 MG/2ML IJ SOLN: 4.0000 mg | Freq: Four times a day (QID) | INTRAMUSCULAR | Status: DC | PRN

## 2016-06-24 MED ORDER — SODIUM CHLORIDE 0.9 % IV BOLUS (SEPSIS)
1000.0000 mL | Freq: Once | INTRAVENOUS | Status: AC
Start: 2016-06-24 — End: 2016-06-24
  Administered 2016-06-24: 1000 mL via INTRAVENOUS

## 2016-06-24 MED ORDER — ONDANSETRON HCL 4 MG PO TABS: 4.0000 mg | ORAL_TABLET | Freq: Four times a day (QID) | ORAL | Status: DC | PRN

## 2016-06-24 MED ORDER — MIRABEGRON ER 25 MG PO TB24
50.0000 mg | ORAL_TABLET | Freq: Every day | ORAL | Status: DC
  Administered 2016-06-26 – 2016-07-06 (×11): 50 mg via ORAL
  Filled 2016-06-24 (×2): qty 2
  Filled 2016-06-24: qty 1
  Filled 2016-06-24 (×4): qty 2
  Filled 2016-06-24: qty 1
  Filled 2016-06-24 (×5): qty 2

## 2016-06-24 MED ORDER — ACETAMINOPHEN 650 MG RE SUPP: 650.0000 mg | Freq: Four times a day (QID) | RECTAL | Status: DC | PRN

## 2016-06-24 MED ORDER — VANCOMYCIN HCL IN DEXTROSE 1-5 GM/200ML-% IV SOLN
1000.0000 mg | Freq: Once | INTRAVENOUS | Status: AC
  Administered 2016-06-24: 1000 mg via INTRAVENOUS
  Filled 2016-06-24: qty 200

## 2016-06-24 MED ORDER — ENOXAPARIN SODIUM 30 MG/0.3ML ~~LOC~~ SOLN
30.0000 mg | SUBCUTANEOUS | Status: DC
  Administered 2016-06-24: 30 mg via SUBCUTANEOUS
  Filled 2016-06-24: qty 0.3

## 2016-06-24 MED ORDER — SENNOSIDES-DOCUSATE SODIUM 8.6-50 MG PO TABS
1.0000 | ORAL_TABLET | Freq: Every evening | ORAL | Status: DC | PRN
  Administered 2016-07-03: 1 via ORAL
  Filled 2016-06-24: qty 1

## 2016-06-24 MED ORDER — ESCITALOPRAM OXALATE 10 MG PO TABS
10.0000 mg | ORAL_TABLET | Freq: Every day | ORAL | Status: DC
  Administered 2016-06-24 – 2016-07-06 (×13): 10 mg via ORAL
  Filled 2016-06-24 (×13): qty 1

## 2016-06-24 NOTE — ED Notes (Signed)
Dr Wyvonnia Dusky notified of lactic acid of 2.54

## 2016-06-24 NOTE — ED Provider Notes (Signed)
[facility] DEPT Provider Note   CSN: [id number] Arrival date & time: 06/24/16  0530     History   Chief Complaint Chief Complaint  Patient presents with  . Rectal Pain    HPI Erik Atkins is a 76 y.o. male.  Patient presents via EMS with increasing rectal pain over the past 2 days. States he has been constipated and straining on the toilet. Today while attempting to strain his Foley balloon ruptured and dislodged. 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 Denies any recent fevers or vomiting. Denies any rectal bleeding. States he's been told his had a rectal fissure in the past and hemorrhoids. Denies any blood thinner use. States his last bowel movement was 2 days ago. He is now feels better after arrival. His Foley catheter was replaced daily only had 130 mL on bladder scan. States rectal pain and abdominal pain resolved.   The history is provided by the patient.    Past Medical History:  Diagnosis Date  . Anxiety   . Charcot Marie Tooth muscular atrophy   . COPD (chronic obstructive pulmonary disease) ([place])   . Hemorrhoids   . History of pneumonia   . Hypertension   . Nerve disorder 1996   "[place]"  . Neuropathy ([place])   . Tubular adenoma     There are no active problems to display for this patient.   Past Surgical History:  Procedure Laterality Date  . CATARACT EXTRACTION, BILATERAL    . COLONOSCOPY  04/16/2010   Dr.Rourk- multiple cecal polyps, remainder of the colonic mucosa and rectal mucosa appeared normal. internal hemorrhoids bx= tubular adenoma  . MASTECTOMY FOR GYNECOMASTIA  1979 and 1981  . POSTERIOR CERVICAL FUSION/FORAMINOTOMY  02/06/2012   Procedure: POSTERIOR CERVICAL FUSION/FORAMINOTOMY LEVEL 2;  Surgeon: Ophelia Charter, MD;  Location: [facility];  Service: Neurosurgery;  Laterality: N/A;  Posterior Cervical Three-Four/Four-Five Laminectomy and Fusion with Instrumentation    . TONSILLECTOMY         Home Medications    Prior to Admission medications   Medication Sig Start Date End Date Taking? Authorizing Provider  ALPRAZolam (XANAX) 0.25 MG tablet Take 0.25 mg by mouth 5 (five) times daily.     Yes Historical Provider, MD  belladonna-opium (B&O SUPPRETTES) 16.2-30 MG suppository Place 1 suppository rectally 2 (two) times daily as needed for pain. 06/09/15  Yes Carmin Muskrat, MD  CRANBERRY PO Take 1 tablet by mouth daily.   Yes Historical Provider, MD  Docusate Calcium (STOOL SOFTENER PO) Take 1 tablet by mouth 2 (two) times daily.   Yes Historical Provider, MD  escitalopram (LEXAPRO) 10 MG tablet Take 1 tablet by mouth daily. 12/02/13  Yes Historical Provider, MD  HYDROcodone-acetaminophen (NORCO/VICODIN) 5-325 MG per tablet Take 1 tablet by mouth every 4 (four) hours as needed. 01/22/14  Yes Historical Provider, MD  hydrocortisone (ANUSOL-HC) 2.5 % rectal cream Place 1 application rectally as needed for hemorrhoids or itching.   Yes Historical Provider, MD  Meth-Hyo-M Bl-Na Phos-Ph Sal (URIBEL) 118 MG CAPS Take 118 capsules by mouth as needed.   Yes Historical Provider, MD  mirabegron ER (MYRBETRIQ) 50 MG TB24 tablet Take 50 mg by mouth as needed.   Yes Historical Provider, MD  NITRO-BID 2 % ointment APPLY A PEA SIZE AMOUNT TO THE ANAL CAVITY EVERY 6 HOURS AS NEEDED. 10/17/15  Yes Annitta Needs, NP  NON FORMULARY Potassium   OTC    One  daily   Yes Historical Provider, MD  polyethylene glycol (MIRALAX / GLYCOLAX) packet Take 17 g by mouth daily as needed.   Yes Historical Provider, MD  SPIRIVA HANDIHALER 18 MCG inhalation capsule Place 1 capsule into inhaler and inhale daily. 12/11/11  Yes Historical Provider, MD  torsemide (DEMADEX) 100 MG tablet Take 0.5 tablets by mouth daily. 11/22/13  Yes Historical Provider, MD  Tripelennamine HCl POWD Take 25 mg by mouth 2 (two) times daily.    Yes Historical Provider, MD  valsartan (DIOVAN) 80 MG tablet Take 80 mg by mouth  daily.     Yes Historical Provider, MD  Wheat Dextrin (BENEFIBER PO) Take 1 tablet by mouth daily.   Yes Historical Provider, MD  ciprofloxacin (CIPRO) 500 MG tablet Take 1 tablet (500 mg total) by mouth every 12 (twelve) hours. 02/03/14   Tanna Furry, MD  ciprofloxacin (CIPRO) 500 MG tablet Take 1 tablet (500 mg total) by mouth every 12 (twelve) hours. 02/03/14   Tanna Furry, MD  Phenazopyridine HCl (AZO DINE MAXIMUM STRENGTH) 97.5 MG TABS Take 1 tablet by mouth daily. Reported on 09/15/2015    Historical Provider, MD  tetrahydrozoline 0.05 % ophthalmic solution Place 1-2 drops into both eyes daily. Reported on 09/15/2015    Historical Provider, MD    Family History No family history on file.  Social History Social History  Substance Use Topics  . Smoking status: Former Smoker    Packs/day: 0.50    Years: 50.00    Types: Cigarettes  . Smokeless tobacco: Never Used     Comment: uses e cigs  . Alcohol use No     Allergies   Penicillins; Darvon; Celexa [citalopram hydrobromide]; Cortisone; Cymbalta [duloxetine hcl]; Eggs or egg-derived products; Guaifenesin er; Keflex [cephalexin]; Motrin [ibuprofen]; Pumpkin seed; Sweet potato; Tea; and Zoloft [sertraline]   Review of Systems Review of Systems  Constitutional: Negative for activity change and fever.  HENT: Negative for congestion and rhinorrhea.   Respiratory: Negative for cough, chest tightness and shortness of breath.   Gastrointestinal: Positive for abdominal pain, constipation and rectal pain. Negative for nausea and vomiting.  Genitourinary: Positive for difficulty urinating and dysuria. Negative for discharge and testicular pain.  Musculoskeletal: Positive for back pain. Negative for arthralgias and myalgias.  Skin: Negative for rash.  Neurological: Negative for weakness and headaches.  A complete 10 system review of systems was obtained and all systems are negative except as noted in the HPI and PMH.     Physical  Exam Updated Vital Signs BP (!) 194/104 (BP Location: Left Arm)   Pulse (!) 138   Temp 100.1 F (37.8 C) (Rectal)   Resp (!) 28   Ht 5\' 5"  (1.651 m)   Wt 135 lb (61.2 kg)   SpO2 95%   BMI 22.47 kg/m   Physical Exam  Constitutional: He is oriented to person, place, and time. He appears well-developed and well-nourished. No distress.  HENT:  Head: Normocephalic and atraumatic.  Mouth/Throat: Oropharynx is clear and moist. No oropharyngeal exudate.  Eyes: Conjunctivae and EOM are normal. Pupils are equal, round, and reactive to light.  Neck: Normal range of motion. Neck supple.  No meningismus.  Cardiovascular: Normal rate, normal heart sounds and intact distal pulses.  Exam reveals no gallop.   No murmur heard. tachycardic  Pulmonary/Chest: Effort normal and breath sounds normal. No respiratory distress. He exhibits no tenderness.  Abdominal: Soft. There is no tenderness. There is no rebound and no guarding.  Genitourinary:  Genitourinary Comments: Erythema to perianal skin. No obvious fissures.  Small hemorrhoid at 12 oclock position, nonbleeding. TTP internally, tender prostate, nonboggy  Foley catheter in place with brown urine.  Testicle nontender  Musculoskeletal: Normal range of motion. He exhibits edema and tenderness.  CMT changes to bilateral LE.  Erythematous weeping wound to lateral RLE. Unable to palpate DP or PT pulse  Neurological: He is alert and oriented to person, place, and time. No cranial nerve deficit. He exhibits normal muscle tone. Coordination normal.  No ataxia on finger to nose bilaterally. No pronator drift. 5/5 strength throughout. CN 2-12 intact.Equal grip strength. Sensation intact.   Skin: Skin is warm.  Psychiatric: He has a normal mood and affect. His behavior is normal.  Nursing note and vitals reviewed.    ED Treatments / Results  Labs (all labs ordered are listed, but only abnormal results are displayed) Labs Reviewed  URINALYSIS,  ROUTINE W REFLEX MICROSCOPIC - Abnormal; Notable for the following:       Result Value   Color, Urine BROWN (*)    APPearance CLOUDY (*)    Glucose, UA 100 (*)    Hgb urine dipstick LARGE (*)    Bilirubin Urine MODERATE (*)    Protein, ur >300 (*)    Leukocytes, UA SMALL (*)    All other components within normal limits  URINALYSIS, MICROSCOPIC (REFLEX) - Abnormal; Notable for the following:    Bacteria, UA MANY (*)    All other components within normal limits  CBC WITH DIFFERENTIAL/PLATELET - Abnormal; Notable for the following:    WBC 17.7 (*)    RBC 3.38 (*)    Hemoglobin 11.1 (*)    HCT 34.1 (*)    MCV 100.9 (*)    Neutro Abs 14.7 (*)    All other components within normal limits  COMPREHENSIVE METABOLIC PANEL - Abnormal; Notable for the following:    Sodium 134 (*)    CO2 20 (*)    Glucose, Bld 107 (*)    BUN 30 (*)    Creatinine, Ser 1.57 (*)    Calcium 8.5 (*)    Albumin 3.3 (*)    ALT 9 (*)    GFR calc non Af Amer 41 (*)    GFR calc Af Amer 47 (*)    All other components within normal limits  I-STAT CG4 LACTIC ACID, ED - Abnormal; Notable for the following:    Lactic Acid, Venous 2.54 (*)    All other components within normal limits  CULTURE, BLOOD (ROUTINE X 2)  CULTURE, BLOOD (ROUTINE X 2)  URINE CULTURE  POC OCCULT BLOOD, ED    EKG  EKG Interpretation None       Radiology No results found.  Procedures Procedures (including critical care time)  Medications Ordered in ED Medications  sodium chloride 0.9 % bolus 1,000 mL (not administered)     Initial Impression / Assessment and Plan / ED Course  I have reviewed the triage vital signs and the nursing notes.  Pertinent labs & imaging results that were available during my care of the patient were reviewed by me and considered in my medical decision making (see chart for details).     Patient with rectal pain, Foley catheter dislodgement.  Catheter replaced prior to my assessment.  Patient with  cellulitis to right lower leg as well as rectal pain. Labs remarkable for leukocytosis, tachycardia and elevated lactate. Patient given IV fluids and IV antibiotics. Urinalysis consistent with infection.  Patient feels like he developed swelling to the left side of his lip while drinking the contrast. There is minimal swelling appreciated. There is no tongue or lip swelling. No difficulty breathing or swallowing. Discussed with patient not to have any further contrast. He does take and ARB.  Patient tachycardic and febrile. Labs with leukocytosis, elevated lactate, AKI.  Meets sepsis criteria.  Will start antibiotics for cellulitis and UTI.  BP and mental status stable.  CT pending to evaluate rectal pain. Dr. Reather Converse to assume care.   Final Clinical Impressions(s) / ED Diagnoses   Final diagnoses:  Sepsis, due to unspecified organism [facility])  Urinary tract infection without hematuria, site unspecified  Rectal pain    New Prescriptions New Prescriptions   No medications on file     Ezequiel Essex, MD 06/24/16 [phone]

## 2016-06-24 NOTE — Progress Notes (Signed)
Pharmacy Antibiotic Note  Erik Atkins is a 76 y.o. male admitted on 06/24/2016 with cellulitis / rectal abscess.  Pharmacy has been consulted for Vancomycin dosing.  Plan: Vancomycin 1000mg  IV q24hrs Monitor labs, progress c/s  Height: 5\' 5"  (165.1 cm) Weight: 135 lb (61.2 kg) IBW/kg (Calculated) : 61.5  Temp (24hrs), Avg:100.1 F (37.8 C), Min:100.1 F (37.8 C), Max:100.1 F (37.8 C)   Recent Labs Lab 06/24/16 0624 06/24/16 0637 06/24/16 0822 06/24/16 1014  WBC 17.7*  --   --   --   CREATININE 1.57*  --   --   --   LATICACIDVEN  --  2.54* 2.0* 1.34    Estimated Creatinine Clearance: 34.1 mL/min (by C-G formula based on SCr of 1.57 mg/dL (H)).    Allergies  Allergen Reactions  . Penicillins   . Darvon Swelling  . Celexa [Citalopram Hydrobromide] Nausea And Vomiting  . Cortisone Swelling  . Cymbalta [Duloxetine Hcl] Nausea Only  . Eggs Or Egg-Derived Products Nausea And Vomiting  . Guaifenesin Er   . Keflex [Cephalexin] Swelling  . Motrin [Ibuprofen]   . Pumpkin Seed Nausea And Vomiting    All pumpkin products  . Sweet Potato Nausea And Vomiting       . Tea Nausea And Vomiting  . Zoloft [Sertraline] Nausea Only    Antimicrobials this admission: Vancomycin 1/29 >>   Dose adjustments this admission:  Microbiology results:  BCx: pending  UCx: pending   Sputum:    MRSA PCR:   Thank you for allowing pharmacy to be a part of this patient's care.  [name] A 06/24/2016 11:04 AM

## 2016-06-24 NOTE — H&P (Signed)
History and Physical    Erik Atkins [id number] DOB: [birth year] DOA: 06/24/2016  Referring MD/NP/PA: Elnora Morrison, EDP PCP: Alonza Bogus, MD  Patient coming from: Home  Chief Complaint: Rectal pain, accidental Foley removal  HPI: Erik Atkins is a 76 y.o. 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 This states that last night at around midnight he complained of rectal pain, while straining on the toilet his Foley catheter was accidentally dislodged and she called EMS for transport to the hospital to have the Foley replaced. Upon arrival he was noticed to have what appears to be a right leg cellulitis with signs of sepsis including hypotension, tachycardia, and a lactic acid of 2.54. CT scan of the abdomen was done given his rectal pain that was essentially negative for acute pathologies,urinalysis urinalysis shows many bacteria with too numerous to count WBCs but negative nitrates. Admission requested  Past Medical/Surgical History: Past Medical History:  Diagnosis Date  . Anxiety   . Charcot Marie Tooth muscular atrophy   . COPD (chronic obstructive pulmonary disease) ([place])   . Hemorrhoids   . History of pneumonia   . Hypertension   . Nerve disorder 1996   "[place]"  . Neuropathy ([place])   . Tubular adenoma     Past Surgical History:  Procedure Laterality Date  . CATARACT EXTRACTION, BILATERAL    . COLONOSCOPY  04/16/2010   Dr.Rourk- multiple cecal polyps, remainder of the colonic mucosa and rectal mucosa appeared normal. internal hemorrhoids bx= tubular adenoma  . MASTECTOMY FOR GYNECOMASTIA  1979 and 1981  . POSTERIOR CERVICAL FUSION/FORAMINOTOMY  02/06/2012   Procedure: POSTERIOR CERVICAL FUSION/FORAMINOTOMY LEVEL 2;  Surgeon: Ophelia Charter, MD;  Location: [facility];  Service: Neurosurgery;  Laterality: N/A;  Posterior Cervical Three-Four/Four-Five  Laminectomy and Fusion with Instrumentation  . TONSILLECTOMY      Social History:  reports that he has quit smoking. His smoking use included Cigarettes. He has a 25.00 pack-year smoking history. He has never used smokeless tobacco. He reports that he does not drink alcohol or use drugs.  Allergies: Allergies  Allergen Reactions  . Penicillins   . Darvon Swelling  . Celexa [Citalopram Hydrobromide] Nausea And Vomiting  . Cortisone Swelling  . Cymbalta [Duloxetine Hcl] Nausea Only  . Eggs Or Egg-Derived Products Nausea And Vomiting  . Guaifenesin Er   . Keflex [Cephalexin] Swelling  . Motrin [Ibuprofen]   . Pumpkin Seed Nausea And Vomiting    All pumpkin products  . Sweet Potato Nausea And Vomiting       . Tea Nausea And Vomiting  . Zoloft [Sertraline] Nausea Only    Family History:  Patient and patient's wife are unaware of any significant family history  Prior to Admission medications   Medication Sig Start Date End Date Taking? Authorizing Provider  ALPRAZolam (XANAX) 0.25 MG tablet Take 0.25 mg by mouth 5 (five) times daily.     Yes Historical Provider, MD  belladonna-opium (B&O SUPPRETTES) 16.2-30 MG suppository Place 1 suppository rectally 2 (two) times daily as needed for pain. 06/09/15  Yes Carmin Muskrat, MD  CRANBERRY PO Take 1 tablet by mouth daily.   Yes Historical Provider, MD  Docusate Calcium (STOOL SOFTENER PO) Take 1 tablet by mouth 2 (two) times daily.   Yes Historical Provider, MD  escitalopram (LEXAPRO) 10 MG tablet Take 1 tablet by mouth daily. 12/02/13  Yes Historical Provider, MD  HYDROcodone-acetaminophen (NORCO/VICODIN) 5-325 MG  per tablet Take 1 tablet by mouth every 4 (four) hours as needed. 01/22/14  Yes Historical Provider, MD  hydrocortisone (ANUSOL-HC) 2.5 % rectal cream Place 1 application rectally as needed for hemorrhoids or itching.   Yes Historical Provider, MD  Meth-Hyo-M Bl-Na Phos-Ph Sal (URIBEL) 118 MG CAPS Take 118 capsules by mouth as  needed.   Yes Historical Provider, MD  mirabegron ER (MYRBETRIQ) 50 MG TB24 tablet Take 50 mg by mouth as needed.   Yes Historical Provider, MD  NITRO-BID 2 % ointment APPLY A PEA SIZE AMOUNT TO THE ANAL CAVITY EVERY 6 HOURS AS NEEDED. 10/17/15  Yes Annitta Needs, NP  NON FORMULARY Potassium   OTC    One daily   Yes Historical Provider, MD  polyethylene glycol (MIRALAX / GLYCOLAX) packet Take 17 g by mouth daily as needed.   Yes Historical Provider, MD  SPIRIVA HANDIHALER 18 MCG inhalation capsule Place 1 capsule into inhaler and inhale daily. 12/11/11  Yes Historical Provider, MD  torsemide (DEMADEX) 100 MG tablet Take 0.5 tablets by mouth daily. 11/22/13  Yes Historical Provider, MD  Tripelennamine HCl POWD Take 25 mg by mouth 2 (two) times daily.    Yes Historical Provider, MD  valsartan (DIOVAN) 80 MG tablet Take 80 mg by mouth daily.     Yes Historical Provider, MD  Wheat Dextrin (BENEFIBER PO) Take 1 tablet by mouth daily.   Yes Historical Provider, MD  ciprofloxacin (CIPRO) 500 MG tablet Take 1 tablet (500 mg total) by mouth every 12 (twelve) hours. 02/03/14   Tanna Furry, MD  ciprofloxacin (CIPRO) 500 MG tablet Take 1 tablet (500 mg total) by mouth every 12 (twelve) hours. 02/03/14   Tanna Furry, MD  Phenazopyridine HCl (AZO DINE MAXIMUM STRENGTH) 97.5 MG TABS Take 1 tablet by mouth daily. Reported on 09/15/2015    Historical Provider, MD  tetrahydrozoline 0.05 % ophthalmic solution Place 1-2 drops into both eyes daily. Reported on 09/15/2015    Historical Provider, MD    Review of Systems:  Constitutional: Denies fever, chills, diaphoresis, appetite change and fatigue.  HEENT: Denies photophobia, eye pain, redness, hearing loss, ear pain, congestion, sore throat, rhinorrhea, sneezing, mouth sores, trouble swallowing, neck pain, neck stiffness and tinnitus.   Respiratory: Denies SOB, DOE, cough, chest tightness,  and wheezing.   Cardiovascular: Denies chest pain, palpitations and leg swelling.    Gastrointestinal: Denies nausea, vomiting, abdominal pain, diarrhea, constipation, blood in stool and abdominal distention.  Genitourinary: Denies dysuria, urgency, frequency, hematuria, flank pain and difficulty urinating.  Endocrine: Denies: hot or cold intolerance, sweats, changes in hair or nails, polyuria, polydipsia. Musculoskeletal: Denies myalgias, back pain, joint swelling, arthralgias and gait problem.  Skin: Denies pallor, rash and wound.  Neurological: Denies dizziness, seizures, syncope, weakness, light-headedness, numbness and headaches.  Hematological: Denies adenopathy. Easy bruising, personal or family bleeding history  Psychiatric/Behavioral: Denies suicidal ideation, mood changes, confusion, nervousness, sleep disturbance and agitation    Physical Exam: Vitals:   06/24/16 1300 06/24/16 1330 06/24/16 1400 06/24/16 1430  BP: 178/79 151/78 159/63   Pulse:      Resp: 25 24 (!) 28 23  Temp:      TempSrc:      SpO2:      Weight:      Height:         Constitutional: NAD, calm, comfortable,Thin, malnourished Eyes: PERRL, lids and conjunctivae normal ENMT: Mucous membranes are dry. Posterior pharynx clear of any exudate or lesions.Normal dentition.  Neck: normal,  supple, no masses, no thyromegaly Respiratory: clear to auscultation bilaterally, no wheezing, no crackles. Normal respiratory effort. No accessory muscle use.  Cardiovascular: Regular rate and rhythm, no murmurs / rubs / gallops. No extremity edema. 2+ pedal pulses. No carotid bruits.  Abdomen: no tenderness, no masses palpated. No hepatosplenomegaly. Bowel sounds positive.  Musculoskeletal: Right lower extremity:         Skin: no rashes, lesions, ulcers. No induration Neurologic: CN 2-12 grossly intact. Sensation intact, DTR normal. Strength 5/5 in all 4.  Psychiatric: Normal judgment and insight. Alert and oriented x 3. Normal mood.    Labs on Admission: I have personally reviewed the following labs  and imaging studies  CBC:  Recent Labs Lab 06/24/16 0624  WBC 17.7*  NEUTROABS 14.7*  HGB 11.1*  HCT 34.1*  MCV 100.9*  PLT 123XX123   Basic Metabolic Panel:  Recent Labs Lab 06/24/16 0624  NA 134*  K 4.1  CL 102  CO2 20*  GLUCOSE 107*  BUN 30*  CREATININE 1.57*  CALCIUM 8.5*   GFR: Estimated Creatinine Clearance: 34.1 mL/min (by C-G formula based on SCr of 1.57 mg/dL (H)). Liver Function Tests:  Recent Labs Lab 06/24/16 0624  AST 17  ALT 9*  ALKPHOS 68  BILITOT 0.8  PROT 7.5  ALBUMIN 3.3*   No results for input(s): LIPASE, AMYLASE in the last 168 hours. No results for input(s): AMMONIA in the last 168 hours. Coagulation Profile: No results for input(s): INR, PROTIME in the last 168 hours. Cardiac Enzymes: No results for input(s): CKTOTAL, CKMB, CKMBINDEX, TROPONINI in the last 168 hours. BNP (last 3 results) No results for input(s): PROBNP in the last 8760 hours. HbA1C: No results for input(s): HGBA1C in the last 72 hours. CBG: No results for input(s): GLUCAP in the last 168 hours. Lipid Profile: No results for input(s): CHOL, HDL, LDLCALC, TRIG, CHOLHDL, LDLDIRECT in the last 72 hours. Thyroid Function Tests: No results for input(s): TSH, T4TOTAL, FREET4, T3FREE, THYROIDAB in the last 72 hours. Anemia Panel: No results for input(s): VITAMINB12, FOLATE, FERRITIN, TIBC, IRON, RETICCTPCT in the last 72 hours. Urine analysis:    Component Value Date/Time   COLORURINE BROWN (A) 06/24/2016 0606   APPEARANCEUR CLOUDY (A) 06/24/2016 0606   LABSPEC 1.020 06/24/2016 0606   PHURINE 6.5 06/24/2016 0606   GLUCOSEU 100 (A) 06/24/2016 0606   HGBUR LARGE (A) 06/24/2016 0606   BILIRUBINUR MODERATE (A) 06/24/2016 0606   KETONESUR NEGATIVE 06/24/2016 0606   PROTEINUR >300 (A) 06/24/2016 0606   UROBILINOGEN 2.0 (H) 02/03/2014 1442   NITRITE NEGATIVE 06/24/2016 0606   LEUKOCYTESUR SMALL (A) 06/24/2016 0606   Sepsis  Labs: @LABRCNTIP (procalcitonin:4,lacticidven:4) ) Recent Results (from the past 240 hour(s))  Blood culture (routine x 2)     Status: None (Preliminary result)   Collection Time: 06/24/16  8:13 AM  Result Value Ref Range Status   Specimen Description BLOOD RIGHT HAND  Final   Special Requests BOTTLES DRAWN AEROBIC AND ANAEROBIC 6CC  Final   Culture PENDING  Incomplete   Report Status PENDING  Incomplete  Blood culture (routine x 2)     Status: None (Preliminary result)   Collection Time: 06/24/16  8:22 AM  Result Value Ref Range Status   Specimen Description BLOOD LEFT ARM  Final   Special Requests BOTTLES DRAWN AEROBIC AND ANAEROBIC 6CC  Final   Culture PENDING  Incomplete   Report Status PENDING  Incomplete     Radiological Exams on Admission: Ct Abdomen Pelvis Wo  Contrast  Result Date: 06/24/2016 CLINICAL DATA:  Penile and rectal pain for 2 days.  Constipation. EXAM: CT ABDOMEN AND PELVIS WITHOUT CONTRAST TECHNIQUE: Multidetector CT imaging of the abdomen and pelvis was performed following the standard protocol without IV contrast. COMPARISON:  06/22/2012. FINDINGS: Lower chest: Lung bases show mild cylindrical bronchiectasis in both lower lobes. Heart size normal. Coronary artery calcification. No pericardial or pleural effusion. Hepatobiliary: Liver and gallbladder are unremarkable. No biliary ductal dilatation. Pancreas: Negative. Spleen: Negative. Adrenals/Urinary Tract: Adrenal glands are unremarkable. Low-attenuation lesions in the kidneys measure up to 3.1 cm on the right, similar and likely cysts although definitive characterization is difficult without IV contrast. No urinary stones. Ureters are decompressed. Foley catheter is seen in a decompressed bladder. Stomach/Bowel: Stomach and majority of the small bowel are unremarkable. A short segment of nonobstructed small bowel extends into a right inguinal hernia. Appendix is not readily visualized. After amount of stool is seen in  the colon. Vascular/Lymphatic: Atherosclerotic calcification of the arterial vasculature without abdominal aortic aneurysm. No pathologically enlarged lymph nodes. Reproductive: Prostate is visualized. Other: Right inguinal hernia contains a short segment of unobstructed small bowel. Small left inguinal hernia contains fat. No free fluid. Mesenteries and peritoneum are unremarkable. Musculoskeletal: Degenerative changes are seen in the hips, left greater than right, and spine. No worrisome lytic or sclerotic lesions. IMPRESSION: 1. No acute findings to explain the patient's symptoms. 2. Small right inguinal hernia contains a short segment of unobstructed small bowel. Small left inguinal hernia contains fat. 3. Fair amount of stool in the colon is indicative of constipation. 4. Aortic atherosclerosis (ICD10-170.0). Coronary artery calcification. 5. Advanced left hip osteoarthritis. Electronically Signed   By: Lorin Picket M.D.   On: 06/24/2016 08:25    EKG: Independently reviewed. None obtained in the ED  Assessment/Plan Principal Problem:   Cellulitis of right leg Active Problems:   Sepsis ([place])   HTN (hypertension)   Neurogenic bladder   Chronic indwelling Foley catheter    Sepsis -Most likely source is the right lower extremity cellulitis, unable to completely exclude the possibility of a UTI given his indwelling Foley catheter. -Was given fluids per sepsis protocol, blood pressure has stabilized, lactic acid has normalized. -Will give clindamycin to treat cellulitis, urine and blood cultures have been requested. Would only treat as UTI if urine culture is positive.  Hypertension -Holding all BP meds given ongoing sepsis.  Neurogenic bladder -Keep Foley catheter, continue myrbetriq.  Dr. Luan Pulling to assume care in a.m.     DVT prophylaxis: Lovenox  Code Status: Full code  Family Communication: Wife at bedside updated on plan of care and questions answered  Disposition Plan: [facility]  for discharge home in 24-48 hours  Consults called: None  Admission status: Observation    Time Spent: 75 minutes   Lelon Frohlich MD [facility] Pager [phone]  If 7PM-7AM, please contact night-coverage [email or web address] Password [facility]  06/24/2016, 4:38 PM

## 2016-06-24 NOTE — ED Triage Notes (Signed)
Pt c/o rectal pain that started yesterday becoming worse during the night, pt also states that his foley balloon busted this am around 2:30,

## 2016-06-24 NOTE — ED Provider Notes (Signed)
Patient signed out to follow up CT results to ensure no rectal abscess then admit for cellulitis and possible UTI. CT results reviewed with patient, no acute findings.  Patient improved on recheck, repeat Lactate pending.  Pt has mmm, no respiratory sxs on recheck, mild tachycardia.   Vitals:   06/24/16 0830 06/24/16 0900  BP: 161/63 164/62  Pulse: 112 112  Resp: 24 21  Temp:     The patients results and plan were reviewed and discussed.   Any x-rays performed were independently reviewed by myself.   Differential diagnosis were considered with the presenting HPI.  Medications  iopamidol (ISOVUE-300) 61 % injection (not administered)  ciprofloxacin (CIPRO) IVPB 400 mg (400 mg Intravenous New Bag/Given 06/24/16 0953)  sodium chloride 0.9 % bolus 1,000 mL (0 mLs Intravenous Stopped 06/24/16 0832)  vancomycin (VANCOCIN) IVPB 1000 mg/200 mL premix (1,000 mg Intravenous New Bag/Given 06/24/16 0816)    Vitals:   06/24/16 0730 06/24/16 0800 06/24/16 0830 06/24/16 0900  BP: 176/69 147/78 161/63 164/62  Pulse: 118  112 112  Resp: 26 24 24 21   Temp:      TempSrc:      SpO2: 92%  91% 92%  Weight:      Height:        Final diagnoses:  Sepsis, due to unspecified organism [facility])  Urinary tract infection without hematuria, site unspecified  Rectal pain  Cellulitis right leg  Admission/ observation were discussed with the admitting physician, patient and/or family and they are comfortable with the plan.     Elnora Morrison, MD 06/24/16 1005

## 2016-06-25 DIAGNOSIS — L03119 Cellulitis of unspecified part of limb: Secondary | ICD-10-CM | POA: Diagnosis not present

## 2016-06-25 DIAGNOSIS — G710 Muscular dystrophy: Secondary | ICD-10-CM | POA: Diagnosis not present

## 2016-06-25 DIAGNOSIS — Z743 Need for continuous supervision: Secondary | ICD-10-CM | POA: Diagnosis not present

## 2016-06-25 DIAGNOSIS — R0600 Dyspnea, unspecified: Secondary | ICD-10-CM | POA: Diagnosis not present

## 2016-06-25 DIAGNOSIS — K5900 Constipation, unspecified: Secondary | ICD-10-CM | POA: Diagnosis present

## 2016-06-25 DIAGNOSIS — K6289 Other specified diseases of anus and rectum: Secondary | ICD-10-CM | POA: Diagnosis present

## 2016-06-25 DIAGNOSIS — I10 Essential (primary) hypertension: Secondary | ICD-10-CM | POA: Diagnosis not present

## 2016-06-25 DIAGNOSIS — I482 Chronic atrial fibrillation: Secondary | ICD-10-CM | POA: Diagnosis not present

## 2016-06-25 DIAGNOSIS — X58XXXA Exposure to other specified factors, initial encounter: Secondary | ICD-10-CM | POA: Diagnosis present

## 2016-06-25 DIAGNOSIS — L03115 Cellulitis of right lower limb: Secondary | ICD-10-CM | POA: Diagnosis not present

## 2016-06-25 DIAGNOSIS — G8929 Other chronic pain: Secondary | ICD-10-CM | POA: Diagnosis present

## 2016-06-25 DIAGNOSIS — L8990 Pressure ulcer of unspecified site, unspecified stage: Secondary | ICD-10-CM | POA: Diagnosis present

## 2016-06-25 DIAGNOSIS — I509 Heart failure, unspecified: Secondary | ICD-10-CM | POA: Diagnosis not present

## 2016-06-25 DIAGNOSIS — B370 Candidal stomatitis: Secondary | ICD-10-CM | POA: Diagnosis not present

## 2016-06-25 DIAGNOSIS — R279 Unspecified lack of coordination: Secondary | ICD-10-CM | POA: Diagnosis not present

## 2016-06-25 DIAGNOSIS — R1310 Dysphagia, unspecified: Secondary | ICD-10-CM | POA: Diagnosis present

## 2016-06-25 DIAGNOSIS — G4700 Insomnia, unspecified: Secondary | ICD-10-CM | POA: Diagnosis not present

## 2016-06-25 DIAGNOSIS — B379 Candidiasis, unspecified: Secondary | ICD-10-CM | POA: Diagnosis present

## 2016-06-25 DIAGNOSIS — K1230 Oral mucositis (ulcerative), unspecified: Secondary | ICD-10-CM | POA: Diagnosis present

## 2016-06-25 DIAGNOSIS — Z87891 Personal history of nicotine dependence: Secondary | ICD-10-CM | POA: Diagnosis not present

## 2016-06-25 DIAGNOSIS — I130 Hypertensive heart and chronic kidney disease with heart failure and stage 1 through stage 4 chronic kidney disease, or unspecified chronic kidney disease: Secondary | ICD-10-CM | POA: Diagnosis present

## 2016-06-25 DIAGNOSIS — A419 Sepsis, unspecified organism: Secondary | ICD-10-CM | POA: Diagnosis not present

## 2016-06-25 DIAGNOSIS — R2681 Unsteadiness on feet: Secondary | ICD-10-CM | POA: Diagnosis not present

## 2016-06-25 DIAGNOSIS — K649 Unspecified hemorrhoids: Secondary | ICD-10-CM | POA: Diagnosis present

## 2016-06-25 DIAGNOSIS — R0602 Shortness of breath: Secondary | ICD-10-CM | POA: Diagnosis not present

## 2016-06-25 DIAGNOSIS — M6281 Muscle weakness (generalized): Secondary | ICD-10-CM | POA: Diagnosis not present

## 2016-06-25 DIAGNOSIS — E46 Unspecified protein-calorie malnutrition: Secondary | ICD-10-CM | POA: Diagnosis not present

## 2016-06-25 DIAGNOSIS — T83028A Displacement of other indwelling urethral catheter, initial encounter: Secondary | ICD-10-CM | POA: Diagnosis present

## 2016-06-25 DIAGNOSIS — J9690 Respiratory failure, unspecified, unspecified whether with hypoxia or hypercapnia: Secondary | ICD-10-CM | POA: Diagnosis not present

## 2016-06-25 DIAGNOSIS — J441 Chronic obstructive pulmonary disease with (acute) exacerbation: Secondary | ICD-10-CM | POA: Diagnosis not present

## 2016-06-25 DIAGNOSIS — A4189 Other specified sepsis: Secondary | ICD-10-CM | POA: Diagnosis not present

## 2016-06-25 DIAGNOSIS — H538 Other visual disturbances: Secondary | ICD-10-CM | POA: Diagnosis not present

## 2016-06-25 DIAGNOSIS — N319 Neuromuscular dysfunction of bladder, unspecified: Secondary | ICD-10-CM | POA: Diagnosis present

## 2016-06-25 DIAGNOSIS — G9341 Metabolic encephalopathy: Secondary | ICD-10-CM | POA: Diagnosis not present

## 2016-06-25 DIAGNOSIS — Z888 Allergy status to other drugs, medicaments and biological substances status: Secondary | ICD-10-CM | POA: Diagnosis not present

## 2016-06-25 DIAGNOSIS — N189 Chronic kidney disease, unspecified: Secondary | ICD-10-CM | POA: Diagnosis present

## 2016-06-25 DIAGNOSIS — I4891 Unspecified atrial fibrillation: Secondary | ICD-10-CM | POA: Diagnosis not present

## 2016-06-25 DIAGNOSIS — F419 Anxiety disorder, unspecified: Secondary | ICD-10-CM | POA: Diagnosis present

## 2016-06-25 DIAGNOSIS — G600 Hereditary motor and sensory neuropathy: Secondary | ICD-10-CM | POA: Diagnosis present

## 2016-06-25 LAB — CBC WITH DIFFERENTIAL/PLATELET
BASOS ABS: 0 10*3/uL (ref 0.0–0.1)
BASOS PCT: 0 %
Eosinophils Absolute: 0 10*3/uL (ref 0.0–0.7)
Eosinophils Relative: 0 %
HEMATOCRIT: 28.3 % — AB (ref 39.0–52.0)
HEMOGLOBIN: 10 g/dL — AB (ref 13.0–17.0)
Lymphocytes Relative: 10 %
Lymphs Abs: 1.5 10*3/uL (ref 0.7–4.0)
MCH: 34.5 pg — ABNORMAL HIGH (ref 26.0–34.0)
MCHC: 35.3 g/dL (ref 30.0–36.0)
MCV: 97.6 fL (ref 78.0–100.0)
MONOS PCT: 7 %
Monocytes Absolute: 1 10*3/uL (ref 0.1–1.0)
NEUTROS ABS: 12.6 10*3/uL — AB (ref 1.7–7.7)
NEUTROS PCT: 83 %
Platelets: 253 10*3/uL (ref 150–400)
RBC: 2.9 MIL/uL — ABNORMAL LOW (ref 4.22–5.81)
RDW: 12.5 % (ref 11.5–15.5)
WBC: 15.1 10*3/uL — AB (ref 4.0–10.5)

## 2016-06-25 LAB — CBC
HEMATOCRIT: 30.6 % — AB (ref 39.0–52.0)
Hemoglobin: 10.1 g/dL — ABNORMAL LOW (ref 13.0–17.0)
MCH: 33 pg (ref 26.0–34.0)
MCHC: 33 g/dL (ref 30.0–36.0)
MCV: 100 fL (ref 78.0–100.0)
PLATELETS: 266 10*3/uL (ref 150–400)
RBC: 3.06 MIL/uL — ABNORMAL LOW (ref 4.22–5.81)
RDW: 12 % (ref 11.5–15.5)
WBC: 14.9 10*3/uL — AB (ref 4.0–10.5)

## 2016-06-25 LAB — URINE CULTURE: Culture: <10000 — AB

## 2016-06-25 LAB — BASIC METABOLIC PANEL
ANION GAP: 7 (ref 5–15)
BUN: 18 mg/dL (ref 6–20)
CO2: 23 mmol/L (ref 22–32)
Calcium: 8 mg/dL — ABNORMAL LOW (ref 8.9–10.3)
Chloride: 106 mmol/L (ref 101–111)
Creatinine, Ser: 1.19 mg/dL (ref 0.61–1.24)
GFR calc Af Amer: >60 mL/min (ref >60)
GFR, EST NON AFRICAN AMERICAN: 57 mL/min — AB (ref >60)
Glucose, Bld: 116 mg/dL — ABNORMAL HIGH (ref 65–99)
POTASSIUM: 3.4 mmol/L — AB (ref 3.5–5.1)
Sodium: 136 mmol/L (ref 135–145)

## 2016-06-25 LAB — HIV ANTIBODY (ROUTINE TESTING W REFLEX): HIV SCREEN 4TH GENERATION: NONREACTIVE

## 2016-06-25 MED ORDER — POTASSIUM CHLORIDE CRYS ER 20 MEQ PO TBCR
20.0000 meq | EXTENDED_RELEASE_TABLET | Freq: Two times a day (BID) | ORAL | Status: AC
  Administered 2016-06-25 – 2016-06-26 (×4): 20 meq via ORAL
  Filled 2016-06-25 (×4): qty 1

## 2016-06-25 MED ORDER — TRAZODONE HCL 50 MG PO TABS
50.0000 mg | ORAL_TABLET | Freq: Every evening | ORAL | Status: DC | PRN
  Administered 2016-06-28 – 2016-07-03 (×3): 50 mg via ORAL
  Filled 2016-06-25 (×3): qty 1

## 2016-06-25 MED ORDER — HYDROMORPHONE HCL 1 MG/ML IJ SOLN
1.0000 mg | INTRAMUSCULAR | Status: DC | PRN
  Administered 2016-06-25 – 2016-06-26 (×4): 1 mg via INTRAVENOUS
  Filled 2016-06-25 (×4): qty 1

## 2016-06-25 MED ORDER — ENOXAPARIN SODIUM 40 MG/0.4ML ~~LOC~~ SOLN
40.0000 mg | SUBCUTANEOUS | Status: DC
  Administered 2016-06-25 – 2016-06-29 (×5): 40 mg via SUBCUTANEOUS
  Filled 2016-06-25 (×5): qty 0.4

## 2016-06-25 NOTE — Progress Notes (Signed)
Subjective: He was admitted yesterday with sepsis presumably from right lower extremity cellulitis but with potential urinary tract infection considering his indwelling Foley catheter. He was given fluids per protocol and his sepsis seems to have resolved but he is still having significant pain in his right leg. Current pain medication is not effective. He's having some weeping of fluid out of the leg with a little bit of an open wound. No other new symptoms. He's not having any chills. No nausea vomiting diarrhea. No abdominal pain or chest pain  Objective: Vital signs in last 24 hours: Temp:  [98.1 F (36.7 C)-100 F (37.8 C)] 98.1 F (36.7 C) (01/30 0507) Pulse Rate:  [95-112] 95 (01/30 0507) Resp:  [17-28] 20 (01/30 0507) BP: (151-195)/(50-86) 153/83 (01/30 0507) SpO2:  [92 %-100 %] 100 % (01/30 0507) Weight:  [59.9 kg (132 lb)] 59.9 kg (132 lb) (01/29 1630) Weight change: -1.361 kg (-3 lb) Last BM Date: 06/22/16  Intake/Output from previous day: 01/29 0701 - 01/30 0700 In: 2793.8 [P.O.:440; I.V.:1003.8; IV Piggyback:1350] Out: 7893 [Urine:1275]  PHYSICAL EXAM General appearance: alert, cooperative and Acutely and chronically sick in appearance Resp: clear to auscultation bilaterally Cardio: regular rate and rhythm, S1, S2 normal, no murmur, click, rub or gallop GI: soft, non-tender; bowel sounds normal; no masses,  no organomegaly Extremities: His cellulitis looks the same as the photograph posted in his chart. He has lost some more skin that was crusted over the wound. He is tender in the foot. Mucous membranes are moist. He looks uncomfortable.  Lab Results:  Results for orders placed or performed during the hospital encounter of 06/24/16 (from the past 48 hour(s))  Urinalysis, Routine w reflex microscopic     Status: Abnormal   Collection Time: 06/24/16  6:06 AM  Result Value Ref Range   Color, Urine BROWN (A) YELLOW    Comment: BIOCHEMICALS MAY BE AFFECTED BY COLOR    APPearance CLOUDY (A) CLEAR   Specific Gravity, Urine 1.020 1.005 - 1.030   pH 6.5 5.0 - 8.0   Glucose, UA 100 (A) NEGATIVE mg/dL   Hgb urine dipstick LARGE (A) NEGATIVE   Bilirubin Urine MODERATE (A) NEGATIVE   Ketones, ur NEGATIVE NEGATIVE mg/dL   Protein, ur >300 (A) NEGATIVE mg/dL   Nitrite NEGATIVE NEGATIVE   Leukocytes, UA SMALL (A) NEGATIVE  Urinalysis, Microscopic (reflex)     Status: Abnormal   Collection Time: 06/24/16  6:06 AM  Result Value Ref Range   RBC / HPF TOO NUMEROUS TO COUNT 0 - 5 RBC/hpf   WBC, UA TOO NUMEROUS TO COUNT 0 - 5 WBC/hpf   Bacteria, UA MANY (A) NONE SEEN   Squamous Epithelial / LPF NONE SEEN NONE SEEN  HIV antibody     Status: None   Collection Time: 06/24/16  6:07 AM  Result Value Ref Range   HIV Screen 4th Generation wRfx Non Reactive Non Reactive    Comment: (NOTE) Performed At: [facility] [place], [place] [id number] Lindon Romp MD [id number]   CBC with Differential/Platelet     Status: Abnormal   Collection Time: 06/24/16  6:24 AM  Result Value Ref Range   WBC 17.7 (H) 4.0 - 10.5 K/uL   RBC 3.38 (L) 4.22 - 5.81 MIL/uL   Hemoglobin 11.1 (L) 13.0 - 17.0 g/dL   HCT 34.1 (L) 39.0 - 52.0 %   MCV 100.9 (H) 78.0 - 100.0 fL   MCH 32.8 26.0 - 34.0 pg   MCHC  32.6 30.0 - 36.0 g/dL   RDW 12.0 11.5 - 15.5 %   Platelets 288 150 - 400 K/uL   Neutrophils Relative % 83 %   Neutro Abs 14.7 (H) 1.7 - 7.7 K/uL   Lymphocytes Relative 9 %   Lymphs Abs 1.7 0.7 - 4.0 K/uL   Monocytes Relative 6 %   Monocytes Absolute 1.0 0.1 - 1.0 K/uL   Eosinophils Relative 2 %   Eosinophils Absolute 0.3 0.0 - 0.7 K/uL   Basophils Relative 0 %   Basophils Absolute 0.0 0.0 - 0.1 K/uL  Comprehensive metabolic panel     Status: Abnormal   Collection Time: 06/24/16  6:24 AM  Result Value Ref Range   Sodium 134 (L) 135 - 145 mmol/L   Potassium 4.1 3.5 - 5.1 mmol/L   Chloride 102 101 - 111 mmol/L   CO2 20 (L) 22 - 32 mmol/L   Glucose, Bld  107 (H) 65 - 99 mg/dL   BUN 30 (H) 6 - 20 mg/dL   Creatinine, Ser 1.57 (H) 0.61 - 1.24 mg/dL   Calcium 8.5 (L) 8.9 - 10.3 mg/dL   Total Protein 7.5 6.5 - 8.1 g/dL   Albumin 3.3 (L) 3.5 - 5.0 g/dL   AST 17 15 - 41 U/L   ALT 9 (L) 17 - 63 U/L   Alkaline Phosphatase 68 38 - 126 U/L   Total Bilirubin 0.8 0.3 - 1.2 mg/dL   GFR calc non Af Amer 41 (L) >60 mL/min   GFR calc Af Amer 47 (L) >60 mL/min    Comment: (NOTE) The eGFR has been calculated using the CKD EPI equation. This calculation has not been validated in all clinical situations. eGFR's persistently <60 mL/min signify possible Chronic Kidney Disease.    Anion gap 12 5 - 15  I-Stat CG4 Lactic Acid, ED     Status: Abnormal   Collection Time: 06/24/16  6:37 AM  Result Value Ref Range   Lactic Acid, Venous 2.54 (HH) 0.5 - 1.9 mmol/L   Comment NOTIFIED PHYSICIAN   Blood culture (routine x 2)     Status: None (Preliminary result)   Collection Time: 06/24/16  8:13 AM  Result Value Ref Range   Specimen Description BLOOD RIGHT HAND    Special Requests BOTTLES DRAWN AEROBIC AND ANAEROBIC 6CC    Culture NO GROWTH < 24 HOURS    Report Status PENDING   Blood culture (routine x 2)     Status: None (Preliminary result)   Collection Time: 06/24/16  8:22 AM  Result Value Ref Range   Specimen Description BLOOD LEFT ARM    Special Requests BOTTLES DRAWN AEROBIC AND ANAEROBIC 6CC    Culture NO GROWTH < 24 HOURS    Report Status PENDING   Lactic acid, plasma     Status: Abnormal   Collection Time: 06/24/16  8:22 AM  Result Value Ref Range   Lactic Acid, Venous 2.0 (HH) 0.5 - 1.9 mmol/L    Comment: CRITICAL RESULT CALLED TO, READ BACK BY AND VERIFIED WITH: PATRAW,B AT 11:00AM ON 06/24/16 BY FESTERMAN,C   I-Stat CG4 Lactic Acid, ED     Status: None   Collection Time: 06/24/16 10:14 AM  Result Value Ref Range   Lactic Acid, Venous 1.34 0.5 - 1.9 mmol/L  Basic metabolic panel     Status: Abnormal   Collection Time: 06/25/16  5:39 AM   Result Value Ref Range   Sodium 136 135 - 145 mmol/L   Potassium  3.4 (L) 3.5 - 5.1 mmol/L    Comment: DELTA CHECK NOTED   Chloride 106 101 - 111 mmol/L   CO2 23 22 - 32 mmol/L   Glucose, Bld 116 (H) 65 - 99 mg/dL   BUN 18 6 - 20 mg/dL   Creatinine, Ser 1.19 0.61 - 1.24 mg/dL   Calcium 8.0 (L) 8.9 - 10.3 mg/dL   GFR calc non Af Amer 57 (L) >60 mL/min   GFR calc Af Amer >60 >60 mL/min    Comment: (NOTE) The eGFR has been calculated using the CKD EPI equation. This calculation has not been validated in all clinical situations. eGFR's persistently <60 mL/min signify possible Chronic Kidney Disease.    Anion gap 7 5 - 15  CBC     Status: Abnormal   Collection Time: 06/25/16  5:39 AM  Result Value Ref Range   WBC 14.9 (H) 4.0 - 10.5 K/uL   RBC 3.06 (L) 4.22 - 5.81 MIL/uL   Hemoglobin 10.1 (L) 13.0 - 17.0 g/dL   HCT 30.6 (L) 39.0 - 52.0 %   MCV 100.0 78.0 - 100.0 fL   MCH 33.0 26.0 - 34.0 pg   MCHC 33.0 30.0 - 36.0 g/dL   RDW 12.0 11.5 - 15.5 %   Platelets 266 150 - 400 K/uL    ABGS No results for input(s): PHART, PO2ART, TCO2, HCO3 in the last 72 hours.  Invalid input(s): PCO2 CULTURES Recent Results (from the past 240 hour(s))  Blood culture (routine x 2)     Status: None (Preliminary result)   Collection Time: 06/24/16  8:13 AM  Result Value Ref Range Status   Specimen Description BLOOD RIGHT HAND  Final   Special Requests BOTTLES DRAWN AEROBIC AND ANAEROBIC 6CC  Final   Culture NO GROWTH < 24 HOURS  Final   Report Status PENDING  Incomplete  Blood culture (routine x 2)     Status: None (Preliminary result)   Collection Time: 06/24/16  8:22 AM  Result Value Ref Range Status   Specimen Description BLOOD LEFT ARM  Final   Special Requests BOTTLES DRAWN AEROBIC AND ANAEROBIC 6CC  Final   Culture NO GROWTH < 24 HOURS  Final   Report Status PENDING  Incomplete   Studies/Results: Ct Abdomen Pelvis Wo Contrast  Result Date: 06/24/2016 CLINICAL DATA:  Penile and rectal  pain for 2 days.  Constipation. EXAM: CT ABDOMEN AND PELVIS WITHOUT CONTRAST TECHNIQUE: Multidetector CT imaging of the abdomen and pelvis was performed following the standard protocol without IV contrast. COMPARISON:  06/22/2012. FINDINGS: Lower chest: Lung bases show mild cylindrical bronchiectasis in both lower lobes. Heart size normal. Coronary artery calcification. No pericardial or pleural effusion. Hepatobiliary: Liver and gallbladder are unremarkable. No biliary ductal dilatation. Pancreas: Negative. Spleen: Negative. Adrenals/Urinary Tract: Adrenal glands are unremarkable. Low-attenuation lesions in the kidneys measure up to 3.1 cm on the right, similar and likely cysts although definitive characterization is difficult without IV contrast. No urinary stones. Ureters are decompressed. Foley catheter is seen in a decompressed bladder. Stomach/Bowel: Stomach and majority of the small bowel are unremarkable. A short segment of nonobstructed small bowel extends into a right inguinal hernia. Appendix is not readily visualized. After amount of stool is seen in the colon. Vascular/Lymphatic: Atherosclerotic calcification of the arterial vasculature without abdominal aortic aneurysm. No pathologically enlarged lymph nodes. Reproductive: Prostate is visualized. Other: Right inguinal hernia contains a short segment of unobstructed small bowel. Small left inguinal hernia contains fat. No free fluid.  Mesenteries and peritoneum are unremarkable. Musculoskeletal: Degenerative changes are seen in the hips, left greater than right, and spine. No worrisome lytic or sclerotic lesions. IMPRESSION: 1. No acute findings to explain the patient's symptoms. 2. Small right inguinal hernia contains a short segment of unobstructed small bowel. Small left inguinal hernia contains fat. 3. Fair amount of stool in the colon is indicative of constipation. 4. Aortic atherosclerosis (ICD10-170.0). Coronary artery calcification. 5. Advanced  left hip osteoarthritis. Electronically Signed   By: Lorin Picket M.D.   On: 06/24/2016 08:25    Medications:  Prior to Admission:  Prescriptions Prior to Admission  Medication Sig Dispense Refill Last Dose  . ALPRAZolam (XANAX) 0.25 MG tablet Take 0.25 mg by mouth 5 (five) times daily.     Taking  . belladonna-opium (B&O SUPPRETTES) 16.2-30 MG suppository Place 1 suppository rectally 2 (two) times daily as needed for pain. 12 suppository 0 Not Taking  . CRANBERRY PO Take 1 tablet by mouth daily.   Taking  . Docusate Calcium (STOOL SOFTENER PO) Take 1 tablet by mouth 2 (two) times daily.     Marland Kitchen escitalopram (LEXAPRO) 10 MG tablet Take 1 tablet by mouth daily.   Taking  . HYDROcodone-acetaminophen (NORCO/VICODIN) 5-325 MG per tablet Take 1 tablet by mouth every 4 (four) hours as needed.   10-325??  . hydrocortisone (ANUSOL-HC) 2.5 % rectal cream Place 1 application rectally as needed for hemorrhoids or itching.     . Meth-Hyo-M Bl-Na Phos-Ph Sal (URIBEL) 118 MG CAPS Take 118 capsules by mouth as needed.     . mirabegron ER (MYRBETRIQ) 50 MG TB24 tablet Take 50 mg by mouth as needed.     Marland Kitchen NITRO-BID 2 % ointment APPLY A PEA SIZE AMOUNT TO THE ANAL CAVITY EVERY 6 HOURS AS NEEDED. 30 g 0 Taking  . NON FORMULARY Potassium   OTC    One daily   Taking  . polyethylene glycol (MIRALAX / GLYCOLAX) packet Take 17 g by mouth daily as needed.     Marland Kitchen SPIRIVA HANDIHALER 18 MCG inhalation capsule Place 1 capsule into inhaler and inhale daily.   Taking  . torsemide (DEMADEX) 100 MG tablet Take 0.5 tablets by mouth daily.   Taking  . Tripelennamine HCl POWD Take 25 mg by mouth 2 (two) times daily.    Taking  . valsartan (DIOVAN) 80 MG tablet Take 80 mg by mouth daily.     Taking  . Wheat Dextrin (BENEFIBER PO) Take 1 tablet by mouth daily.     . Phenazopyridine HCl (AZO DINE MAXIMUM STRENGTH) 97.5 MG TABS Take 1 tablet by mouth daily. Reported on 09/15/2015   Taking  . tetrahydrozoline 0.05 % ophthalmic  solution Place 1-2 drops into both eyes daily. Reported on 09/15/2015   Taking   Scheduled: . ALPRAZolam  0.25 mg Oral 5 X Daily  . clindamycin (CLEOCIN) IV  600 mg Intravenous Q8H  . docusate sodium  100 mg Oral BID  . enoxaparin (LOVENOX) injection  30 mg Subcutaneous Q24H  . escitalopram  10 mg Oral Daily  . mirabegron ER  50 mg Oral Daily  . Phenazopyridine HCl  1 tablet Oral Daily  . polyethylene glycol  17 g Oral Daily  . potassium chloride  20 mEq Oral BID  . tiotropium  1 capsule Inhalation Daily   Continuous: . sodium chloride 75 mL/hr at 06/25/16 2353   IRW:[id number] **OR** acetaminophen, HYDROcodone-acetaminophen, HYDROmorphone, ondansetron **OR** ondansetron (ZOFRAN) IV, senna-docusate  Assesment: He has cellulitis.  He was septic but that's better. He has chronic indwelling Foley catheter because of neurogenic bladder. He has chronic pain. His wound is about the same. He has muscular dystrophy at baseline. Principal Problem:   Cellulitis of right leg Active Problems:   Sepsis ([place])   HTN (hypertension)   Neurogenic bladder   Chronic indwelling Foley catheter    Plan: Continue IV antibiotics. Add hydromorphone for pain. Request wound consultation.    LOS: 1 day   Katheleen Stella L 06/25/2016, 8:51 AM

## 2016-06-25 NOTE — Consult Note (Signed)
[facility] Nurse wound consult note Reason for Consult:Infectious wound to right lower leg.  Cellulitis.  Has removable compression at home that was prescribed for someone else but he wears.  Wife has been trying to convince patient to come to MD.   Wound type:Cellulitis Pressure Injury POA: N/A Measurement:7 cm x 3 cm x 0.2 cm with chronic skin changes to periwound and lower leg.  Erythema and tender to touch.  Wound [id number] red and painful.  Drainage (amount, consistency, odor) Minimal serosanguinous weeping. No odor.  Minimal edema at this time.  Periwound:Erytehma and pain Dressing procedure/placement/frequency: Cleanse wound to right leg with soap and water.  Apply Aquacel Ag to wound bed.  Cover with kerlix and tape.  Change twice weekly.  Spoke with patient and wife regarding follow up at wound care center for diagnostic testing to rule out arterial vs venous disease and recommend plan of treatment.  Wife and patient in agreement.  Will not follow at this time.  Please re-consult if needed.  Domenic Moras RN BSN [place] Pager [phone]

## 2016-06-26 ENCOUNTER — Inpatient Hospital Stay (HOSPITAL_COMMUNITY): Payer: Medicare Other

## 2016-06-26 LAB — BASIC METABOLIC PANEL
ANION GAP: 8 (ref 5–15)
BUN: 20 mg/dL (ref 6–20)
CO2: 21 mmol/L — AB (ref 22–32)
Calcium: 8 mg/dL — ABNORMAL LOW (ref 8.9–10.3)
Chloride: 108 mmol/L (ref 101–111)
Creatinine, Ser: 1.4 mg/dL — ABNORMAL HIGH (ref 0.61–1.24)
GFR calc Af Amer: 54 mL/min — ABNORMAL LOW (ref >60)
GFR calc non Af Amer: 47 mL/min — ABNORMAL LOW (ref >60)
GLUCOSE: 116 mg/dL — AB (ref 65–99)
POTASSIUM: 3.8 mmol/L (ref 3.5–5.1)
Sodium: 137 mmol/L (ref 135–145)

## 2016-06-26 MED ORDER — ALBUTEROL SULFATE (2.5 MG/3ML) 0.083% IN NEBU
2.5000 mg | INHALATION_SOLUTION | Freq: Four times a day (QID) | RESPIRATORY_TRACT | Status: DC
  Administered 2016-06-26 – 2016-06-28 (×9): 2.5 mg via RESPIRATORY_TRACT
  Filled 2016-06-26 (×9): qty 3

## 2016-06-26 MED ORDER — IRBESARTAN 75 MG PO TABS: 75.0000 mg | ORAL_TABLET | Freq: Every day | ORAL | Status: DC

## 2016-06-26 MED ORDER — MORPHINE SULFATE (PF) 2 MG/ML IV SOLN
2.0000 mg | INTRAVENOUS | Status: DC | PRN
  Administered 2016-06-27 – 2016-07-03 (×6): 2 mg via INTRAVENOUS
  Filled 2016-06-26 (×6): qty 1

## 2016-06-26 MED ORDER — IRBESARTAN 150 MG PO TABS
150.0000 mg | ORAL_TABLET | Freq: Every day | ORAL | Status: DC
  Administered 2016-06-26 – 2016-07-06 (×11): 150 mg via ORAL
  Filled 2016-06-26 (×11): qty 1

## 2016-06-26 NOTE — Progress Notes (Signed)
Subjective: He says he's had some trouble with shortness of breath. It's not totally clear but it may have been around the time he received a dose of hydromorphone. He is still having a lot of pain in his leg. He does have COPD at baseline. His blood pressure has been up. No other new complaints.  Objective: Vital signs in last 24 hours: Temp:  [98.1 F (36.7 C)-98.9 F (37.2 C)] 98.1 F (36.7 C) (01/31 0514) Pulse Rate:  [83-109] 92 (01/31 0606) Resp:  [20-22] 22 (01/31 0514) BP: (132-183)/(60-74) 171/74 (01/31 0606) SpO2:  [94 %-100 %] 100 % (01/31 0856) Weight change:  Last BM Date: 06/25/16  Intake/Output from previous day: 01/30 0701 - 01/31 0700 In: 1246.3 [P.O.:360; I.V.:836.3; IV Piggyback:50] Out: 1000 [Urine:1000]  PHYSICAL EXAM General appearance: alert, cooperative and moderate distress Resp: rhonchi bilaterally Cardio: regular rate and rhythm, S1, S2 normal, no murmur, click, rub or gallop GI: soft, non-tender; bowel sounds normal; no masses,  no organomegaly Extremities: He still has changes of cellulitis and he still has some weeping of the tissue on his right more than his left leg Mucous membranes are slightly dry he's had some confusion but is alert and oriented now  Lab Results:  Results for orders placed or performed during the hospital encounter of 06/24/16 (from the past 48 hour(s))  I-Stat CG4 Lactic Acid, ED     Status: None   Collection Time: 06/24/16 10:14 AM  Result Value Ref Range   Lactic Acid, Venous 1.34 0.5 - 1.9 mmol/L  Basic metabolic panel     Status: Abnormal   Collection Time: 06/25/16  5:39 AM  Result Value Ref Range   Sodium 136 135 - 145 mmol/L   Potassium 3.4 (L) 3.5 - 5.1 mmol/L    Comment: DELTA CHECK NOTED   Chloride 106 101 - 111 mmol/L   CO2 23 22 - 32 mmol/L   Glucose, Bld 116 (H) 65 - 99 mg/dL   BUN 18 6 - 20 mg/dL   Creatinine, Ser 1.19 0.61 - 1.24 mg/dL   Calcium 8.0 (L) 8.9 - 10.3 mg/dL   GFR calc non Af Amer 57 (L)  >60 mL/min   GFR calc Af Amer >60 >60 mL/min    Comment: (NOTE) The eGFR has been calculated using the CKD EPI equation. This calculation has not been validated in all clinical situations. eGFR's persistently <60 mL/min signify possible Chronic Kidney Disease.    Anion gap 7 5 - 15  CBC     Status: Abnormal   Collection Time: 06/25/16  5:39 AM  Result Value Ref Range   WBC 14.9 (H) 4.0 - 10.5 K/uL   RBC 3.06 (L) 4.22 - 5.81 MIL/uL   Hemoglobin 10.1 (L) 13.0 - 17.0 g/dL   HCT 30.6 (L) 39.0 - 52.0 %   MCV 100.0 78.0 - 100.0 fL   MCH 33.0 26.0 - 34.0 pg   MCHC 33.0 30.0 - 36.0 g/dL   RDW 12.0 11.5 - 15.5 %   Platelets 266 150 - 400 K/uL  CBC with Differential/Platelet     Status: Abnormal   Collection Time: 06/25/16 10:03 AM  Result Value Ref Range   WBC 15.1 (H) 4.0 - 10.5 K/uL   RBC 2.90 (L) 4.22 - 5.81 MIL/uL   Hemoglobin 10.0 (L) 13.0 - 17.0 g/dL   HCT 28.3 (L) 39.0 - 52.0 %   MCV 97.6 78.0 - 100.0 fL   MCH 34.5 (H) 26.0 - 34.0 pg  MCHC 35.3 30.0 - 36.0 g/dL   RDW 60.1 94.7 - 86.5 %   Platelets 253 150 - 400 K/uL   Neutrophils Relative % 83 %   Neutro Abs 12.6 (H) 1.7 - 7.7 K/uL   Lymphocytes Relative 10 %   Lymphs Abs 1.5 0.7 - 4.0 K/uL   Monocytes Relative 7 %   Monocytes Absolute 1.0 0.1 - 1.0 K/uL   Eosinophils Relative 0 %   Eosinophils Absolute 0.0 0.0 - 0.7 K/uL   Basophils Relative 0 %   Basophils Absolute 0.0 0.0 - 0.1 K/uL  Basic metabolic panel     Status: Abnormal   Collection Time: 06/26/16  5:41 AM  Result Value Ref Range   Sodium 137 135 - 145 mmol/L   Potassium 3.8 3.5 - 5.1 mmol/L   Chloride 108 101 - 111 mmol/L   CO2 21 (L) 22 - 32 mmol/L   Glucose, Bld 116 (H) 65 - 99 mg/dL   BUN 20 6 - 20 mg/dL   Creatinine, Ser 4.56 (H) 0.61 - 1.24 mg/dL   Calcium 8.0 (L) 8.9 - 10.3 mg/dL   GFR calc non Af Amer 47 (L) >60 mL/min   GFR calc Af Amer 54 (L) >60 mL/min    Comment: (NOTE) The eGFR has been calculated using the CKD EPI equation. This  calculation has not been validated in all clinical situations. eGFR's persistently <60 mL/min signify possible Chronic Kidney Disease.    Anion gap 8 5 - 15    ABGS No results for input(s): PHART, PO2ART, TCO2, HCO3 in the last 72 hours.  Invalid input(s): PCO2 CULTURES Recent Results (from the past 240 hour(s))  Urine culture     Status: Abnormal   Collection Time: 06/24/16  6:24 AM  Result Value Ref Range Status   Specimen Description URINE, CLEAN CATCH  Final   Special Requests NONE  Final   Culture (A)  Final    <10,000 COLONIES/mL INSIGNIFICANT GROWTH Performed at [facility], [address] [place], [place] [address]    Report Status 06/25/2016 FINAL  Final  Blood culture (routine x 2)     Status: None (Preliminary result)   Collection Time: 06/24/16  8:13 AM  Result Value Ref Range Status   Specimen Description BLOOD RIGHT HAND  Final   Special Requests BOTTLES DRAWN AEROBIC AND ANAEROBIC 6CC  Final   Culture NO GROWTH 2 DAYS  Final   Report Status PENDING  Incomplete  Blood culture (routine x 2)     Status: None (Preliminary result)   Collection Time: 06/24/16  8:22 AM  Result Value Ref Range Status   Specimen Description BLOOD LEFT ARM  Final   Special Requests BOTTLES DRAWN AEROBIC AND ANAEROBIC 6CC  Final   Culture NO GROWTH 2 DAYS  Final   Report Status PENDING  Incomplete   Studies/Results: No results found.  Medications:  Prior to Admission:  Prescriptions Prior to Admission  Medication Sig Dispense Refill Last Dose  . ALPRAZolam (XANAX) 0.25 MG tablet Take 0.25 mg by mouth 5 (five) times daily as needed for anxiety.    unknown  . CRANBERRY PO Take 1 tablet by mouth daily.   Past Week at Unknown time  . Docusate Calcium (STOOL SOFTENER PO) Take 1 tablet by mouth 2 (two) times daily.   Past Week at Unknown time  . escitalopram (LEXAPRO) 10 MG tablet Take 1 tablet by mouth daily.   Past Week at Unknown time  .  HYDROcodone-acetaminophen  (NORCO/VICODIN) 5-325 MG per tablet Take 1 tablet by mouth every 4 (four) hours as needed.   Past Week at Unknown time  . Meth-Hyo-M Bl-Na Phos-Ph Sal (URIBEL) 118 MG CAPS Take 118 capsules by mouth as needed.   Past Week at Unknown time  . mirabegron ER (MYRBETRIQ) 50 MG TB24 tablet Take 50 mg by mouth as needed.   unknown  . NITRO-BID 2 % ointment APPLY A PEA SIZE AMOUNT TO THE ANAL CAVITY EVERY 6 HOURS AS NEEDED. 30 g 0 unknown  . NON FORMULARY Potassium   OTC    One daily   Past Week at Unknown time  . polyethylene glycol (MIRALAX / GLYCOLAX) packet Take 17 g by mouth daily as needed.   Past Month at Unknown time  . SPIRIVA HANDIHALER 18 MCG inhalation capsule Place 1 capsule into inhaler and inhale daily.   Past Week at Unknown time  . torsemide (DEMADEX) 100 MG tablet Take 0.5 tablets by mouth daily.   Past Week at Unknown time  . Tripelennamine HCl POWD Take 25 mg by mouth 2 (two) times daily.    Past Week at Unknown time  . valsartan (DIOVAN) 80 MG tablet Take 80 mg by mouth daily.     Past Week at Unknown time  . Wheat Dextrin (BENEFIBER PO) Take 1 tablet by mouth daily.   Past Week at Unknown time   Scheduled: . albuterol  2.5 mg Nebulization Q6H  . ALPRAZolam  0.25 mg Oral 5 X Daily  . clindamycin (CLEOCIN) IV  600 mg Intravenous Q8H  . docusate sodium  100 mg Oral BID  . enoxaparin (LOVENOX) injection  40 mg Subcutaneous Q24H  . escitalopram  10 mg Oral Daily  . irbesartan  150 mg Oral Daily  . mirabegron ER  50 mg Oral Daily  . polyethylene glycol  17 g Oral Daily  . potassium chloride  20 mEq Oral BID  . tiotropium  1 capsule Inhalation Daily   Continuous: . sodium chloride 75 mL/hr at 06/25/16 5397   QBH:[id number] **OR** acetaminophen, HYDROcodone-acetaminophen, morphine injection, ondansetron **OR** ondansetron (ZOFRAN) IV, senna-docusate, traZODone  Assesment: He was admitted with cellulitis of his leg he was mildly septic on admission. He is improving but still  having a lot of drainage. He has hypertension which is not totally controlled so I have adjusted his medications. He has COPD and I think he is having more trouble with that. He may have had trouble taking hydromorphone but I don't think it's totally clear Principal Problem:   Cellulitis of right leg Active Problems:   Sepsis ([place])   HTN (hypertension)   Neurogenic bladder   Chronic indwelling Foley catheter    Plan: Switch to morphine to be sure. Increase his antihypertensive. Start on nebulizer treatments. No other changes.    LOS: 2 days   Godfrey Tritschler L 06/26/2016, 8:58 AM

## 2016-06-26 NOTE — Progress Notes (Signed)
Dr. Legrand Rams made aware of systolic blood pressure in 170-180. Orders given to restart home medication of Diovan which is non-formulary. Avapro was auto substituted. Will continue to monitor.

## 2016-06-27 ENCOUNTER — Inpatient Hospital Stay (HOSPITAL_COMMUNITY): Payer: Medicare Other

## 2016-06-27 NOTE — Care Management Note (Signed)
Case Management Note  Patient Details  Name: Erik Atkins MRN: [id number] Date of Birth: 1940/06/28  Subjective/Objective:                  Pt is from home, lives with his wife and is ind with ADL's at baseline. He use rollator with ambulation. Anticipate pt will need [facility] nursing for wound care. Pt's wife agreeable.   Action/Plan: Pt status declining, CM will cont to follow for DC planning.   Expected Discharge Date:      07/02/2016            Expected Discharge Plan:  [place]  In-House Referral:  NA  Discharge planning Services  CM Consult  Post Acute Care Choice:  Home Health Choice offered to:  Spouse  HH Arranged:  RN [facility] Agency:     Status of Service:  In process, will continue to follow  Sherald Barge, RN 06/27/2016, 1:49 PM

## 2016-06-27 NOTE — Progress Notes (Signed)
Subjective: He's worse this morning. He's having more wheezing. His pain is a little better. He says his vision is blurred. No other new complaints. He's coughing. He's not coughing anything up. He is wheezing.  Objective: Vital signs in last 24 hours: Temp:  [97.6 F (36.4 C)-98.3 F (36.8 C)] 97.6 F (36.4 C) (02/01 0700) Pulse Rate:  [107-109] 109 (02/01 0700) Resp:  [18-20] 20 (02/01 0700) BP: (171-189)/(74-87) 171/87 (02/01 0700) SpO2:  [92 %-100 %] 96 % (02/01 0849) Weight change:  Last BM Date: 06/25/16  Intake/Output from previous day: 01/31 0701 - 02/01 0700 In: 440 [P.O.:440] Out: 750 [Urine:750]  PHYSICAL EXAM General appearance: alert and moderate distress Resp: wheezes bilaterally Cardio: regular rate and rhythm, S1, S2 normal, no murmur, click, rub or gallop GI: soft, non-tender; bowel sounds normal; no masses,  no organomegaly Extremities: The skin wound looks slightly better. He has a little bit less erythema of his leg Mucous membranes are dry. His pupils do react.  Lab Results:  Results for orders placed or performed during the hospital encounter of 06/24/16 (from the past 48 hour(s))  CBC with Differential/Platelet     Status: Abnormal   Collection Time: 06/25/16 10:03 AM  Result Value Ref Range   WBC 15.1 (H) 4.0 - 10.5 K/uL   RBC 2.90 (L) 4.22 - 5.81 MIL/uL   Hemoglobin 10.0 (L) 13.0 - 17.0 g/dL   HCT 28.3 (L) 39.0 - 52.0 %   MCV 97.6 78.0 - 100.0 fL   MCH 34.5 (H) 26.0 - 34.0 pg   MCHC 35.3 30.0 - 36.0 g/dL   RDW 12.5 11.5 - 15.5 %   Platelets 253 150 - 400 K/uL   Neutrophils Relative % 83 %   Neutro Abs 12.6 (H) 1.7 - 7.7 K/uL   Lymphocytes Relative 10 %   Lymphs Abs 1.5 0.7 - 4.0 K/uL   Monocytes Relative 7 %   Monocytes Absolute 1.0 0.1 - 1.0 K/uL   Eosinophils Relative 0 %   Eosinophils Absolute 0.0 0.0 - 0.7 K/uL   Basophils Relative 0 %   Basophils Absolute 0.0 0.0 - 0.1 K/uL  Basic metabolic panel     Status: Abnormal   Collection  Time: 06/26/16  5:41 AM  Result Value Ref Range   Sodium 137 135 - 145 mmol/L   Potassium 3.8 3.5 - 5.1 mmol/L   Chloride 108 101 - 111 mmol/L   CO2 21 (L) 22 - 32 mmol/L   Glucose, Bld 116 (H) 65 - 99 mg/dL   BUN 20 6 - 20 mg/dL   Creatinine, Ser 1.40 (H) 0.61 - 1.24 mg/dL   Calcium 8.0 (L) 8.9 - 10.3 mg/dL   GFR calc non Af Amer 47 (L) >60 mL/min   GFR calc Af Amer 54 (L) >60 mL/min    Comment: (NOTE) The eGFR has been calculated using the CKD EPI equation. This calculation has not been validated in all clinical situations. eGFR's persistently <60 mL/min signify possible Chronic Kidney Disease.    Anion gap 8 5 - 15    ABGS No results for input(s): PHART, PO2ART, TCO2, HCO3 in the last 72 hours.  Invalid input(s): PCO2 CULTURES Recent Results (from the past 240 hour(s))  Urine culture     Status: Abnormal   Collection Time: 06/24/16  6:24 AM  Result Value Ref Range Status   Specimen Description URINE, CLEAN CATCH  Final   Special Requests NONE  Final   Culture (A)  Final    <  10,000 COLONIES/mL INSIGNIFICANT GROWTH Performed at [facility], [place] [address], [place], [place] [address]    Report Status 06/25/2016 FINAL  Final  Blood culture (routine x 2)     Status: None (Preliminary result)   Collection Time: 06/24/16  8:13 AM  Result Value Ref Range Status   Specimen Description BLOOD RIGHT HAND  Final   Special Requests BOTTLES DRAWN AEROBIC AND ANAEROBIC 6CC  Final   Culture NO GROWTH 3 DAYS  Final   Report Status PENDING  Incomplete  Blood culture (routine x 2)     Status: None (Preliminary result)   Collection Time: 06/24/16  8:22 AM  Result Value Ref Range Status   Specimen Description BLOOD LEFT ARM  Final   Special Requests BOTTLES DRAWN AEROBIC AND ANAEROBIC 6CC  Final   Culture NO GROWTH 3 DAYS  Final   Report Status PENDING  Incomplete   Studies/Results: Dg Chest Port 1 View  Result Date: 06/26/2016 CLINICAL DATA:  Shortness of breath. EXAM:  PORTABLE CHEST 1 VIEW COMPARISON:  02/04/2012 . FINDINGS: Cardiomegaly with pulmonary vascular prominence and bilateral interstitial prominence consistent with congestive heart failure. Small bilateral pleural effusions. No pneumothorax . IMPRESSION: Congestive heart failure with pulmonary interstitial edema and small bilateral pleural effusions. Electronically Signed   By: [place]   On: 06/26/2016 10:10    Medications:  Prior to Admission:  Prescriptions Prior to Admission  Medication Sig Dispense Refill Last Dose  . ALPRAZolam (XANAX) 0.25 MG tablet Take 0.25 mg by mouth 5 (five) times daily as needed for anxiety.    unknown  . CRANBERRY PO Take 1 tablet by mouth daily.   Past Week at Unknown time  . Docusate Calcium (STOOL SOFTENER PO) Take 1 tablet by mouth 2 (two) times daily.   Past Week at Unknown time  . escitalopram (LEXAPRO) 10 MG tablet Take 1 tablet by mouth daily.   Past Week at Unknown time  . HYDROcodone-acetaminophen (NORCO/VICODIN) 5-325 MG per tablet Take 1 tablet by mouth every 4 (four) hours as needed.   Past Week at Unknown time  . Meth-Hyo-M Bl-Na Phos-Ph Sal (URIBEL) 118 MG CAPS Take 118 capsules by mouth as needed.   Past Week at Unknown time  . mirabegron ER (MYRBETRIQ) 50 MG TB24 tablet Take 50 mg by mouth as needed.   unknown  . NITRO-BID 2 % ointment APPLY A PEA SIZE AMOUNT TO THE ANAL CAVITY EVERY 6 HOURS AS NEEDED. 30 g 0 unknown  . NON FORMULARY Potassium   OTC    One daily   Past Week at Unknown time  . polyethylene glycol (MIRALAX / GLYCOLAX) packet Take 17 g by mouth daily as needed.   Past Month at Unknown time  . SPIRIVA HANDIHALER 18 MCG inhalation capsule Place 1 capsule into inhaler and inhale daily.   Past Week at Unknown time  . torsemide (DEMADEX) 100 MG tablet Take 0.5 tablets by mouth daily.   Past Week at Unknown time  . Tripelennamine HCl POWD Take 25 mg by mouth 2 (two) times daily.    Past Week at Unknown time  . valsartan (DIOVAN) 80 MG  tablet Take 80 mg by mouth daily.     Past Week at Unknown time  . Wheat Dextrin (BENEFIBER PO) Take 1 tablet by mouth daily.   Past Week at Unknown time   Scheduled: . albuterol  2.5 mg Nebulization Q6H  . ALPRAZolam  0.25 mg Oral 5 X Daily  .  clindamycin (CLEOCIN) IV  600 mg Intravenous Q8H  . docusate sodium  100 mg Oral BID  . enoxaparin (LOVENOX) injection  40 mg Subcutaneous Q24H  . escitalopram  10 mg Oral Daily  . irbesartan  150 mg Oral Daily  . mirabegron ER  50 mg Oral Daily  . polyethylene glycol  17 g Oral Daily  . tiotropium  1 capsule Inhalation Daily   Continuous: . sodium chloride 10 mL/hr at 06/26/16 4356   YSH:[id number] **OR** acetaminophen, HYDROcodone-acetaminophen, morphine injection, ondansetron **OR** ondansetron (ZOFRAN) IV, senna-docusate, traZODone  Assesment: He was admitted with cellulitis of the right leg with some sepsis. He seems to be having an increased exacerbation of COPD now. He has blurred vision I'm not sure what that is. 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 Principal Problem:   Cellulitis of right leg Active Problems:   Sepsis ([place])   HTN (hypertension)   Neurogenic bladder   Chronic indwelling Foley catheter    Plan: He is allergic to prednisone so I can't add that. Apparently he says he gets swelling. He's going to have a CT of the brain. Continue his IV antibiotics. Continue nebulizer treatments.    LOS: 3 days   Andraya Frigon L 06/27/2016, 9:12 AM

## 2016-06-28 ENCOUNTER — Inpatient Hospital Stay (HOSPITAL_COMMUNITY): Payer: Medicare Other

## 2016-06-28 DIAGNOSIS — R0600 Dyspnea, unspecified: Secondary | ICD-10-CM

## 2016-06-28 LAB — ECHOCARDIOGRAM COMPLETE
Height: 64 in
WEIGHTICAEL: 2112 [oz_av]

## 2016-06-28 MED ORDER — FUROSEMIDE 10 MG/ML IJ SOLN
40.0000 mg | Freq: Two times a day (BID) | INTRAMUSCULAR | Status: DC
Start: 2016-06-28 — End: 2016-07-04
  Administered 2016-06-28 – 2016-07-03 (×12): 40 mg via INTRAVENOUS
  Filled 2016-06-28 (×13): qty 4

## 2016-06-28 MED ORDER — ALBUTEROL SULFATE (2.5 MG/3ML) 0.083% IN NEBU
2.5000 mg | INHALATION_SOLUTION | Freq: Three times a day (TID) | RESPIRATORY_TRACT | Status: DC
  Administered 2016-06-28 – 2016-07-06 (×24): 2.5 mg via RESPIRATORY_TRACT
  Filled 2016-06-28 (×23): qty 3

## 2016-06-28 NOTE — Progress Notes (Signed)
Subjective: He looks better this morning. He is still short of breath. He says he wants to go home but he understands that he's not ready. He wants to get up. No chest pain. No PND or orthopnea. He's having some trouble swallowing and gets choked on water according to his wife.  Objective: Vital signs in last 24 hours: Temp:  [98.2 F (36.8 C)-98.7 F (37.1 C)] 98.7 F (37.1 C) (02/02 0659) Pulse Rate:  [81-115] 81 (02/02 0659) Resp:  [20] 20 (02/02 0659) BP: (126-161)/(70-84) 126/71 (02/02 0659) SpO2:  [91 %-100 %] 99 % (02/02 0818) Weight change:  Last BM Date: 06/27/16  Intake/Output from previous day: 02/01 0701 - 02/02 0700 In: 240 [P.O.:240] Out: 450 [Urine:450]  PHYSICAL EXAM General appearance: alert, cooperative and mild distress Resp: wheezes bilaterally Cardio: regular rate and rhythm, S1, S2 normal, no murmur, click, rub or gallop GI: soft, non-tender; bowel sounds normal; no masses,  no organomegaly Extremities: The wound on his leg is slowly improving Skin warm and dry.  Lab Results:  No results found for this or any previous visit (from the past 48 hour(s)).  ABGS No results for input(s): PHART, PO2ART, TCO2, HCO3 in the last 72 hours.  Invalid input(s): PCO2 CULTURES Recent Results (from the past 240 hour(s))  Urine culture     Status: Abnormal   Collection Time: 06/24/16  6:24 AM  Result Value Ref Range Status   Specimen Description URINE, CLEAN CATCH  Final   Special Requests NONE  Final   Culture (A)  Final    <10,000 COLONIES/mL INSIGNIFICANT GROWTH Performed at [facility], [address] [place], [place] [address]    Report Status 06/25/2016 FINAL  Final  Blood culture (routine x 2)     Status: None (Preliminary result)   Collection Time: 06/24/16  8:13 AM  Result Value Ref Range Status   Specimen Description BLOOD RIGHT HAND  Final   Special Requests BOTTLES DRAWN AEROBIC AND ANAEROBIC 6CC  Final   Culture NO GROWTH 3 DAYS  Final    Report Status PENDING  Incomplete  Blood culture (routine x 2)     Status: None (Preliminary result)   Collection Time: 06/24/16  8:22 AM  Result Value Ref Range Status   Specimen Description BLOOD LEFT ARM  Final   Special Requests BOTTLES DRAWN AEROBIC AND ANAEROBIC 6CC  Final   Culture NO GROWTH 3 DAYS  Final   Report Status PENDING  Incomplete   Studies/Results: Dg Chest 1 View  Result Date: 06/27/2016 CLINICAL DATA:  Worsening shortness of breath. COPD. Former smoker. EXAM: CHEST 1 VIEW COMPARISON:  06/26/2016 FINDINGS: Heart size remains within normal limits.  Aortic atherosclerosis. Pulmonary interstitial prominence appears decreased compared to prior study. Mild persistent interstitial prominence has a coarse appearance, suspicious for chronic interstitial lung disease. Mild hyperinflation is unchanged and consistent with COPD. No evidence of pulmonary consolidation. No significant pleural effusion or pneumothorax. IMPRESSION: Decreased pulmonary interstitial prominence since previous study. No evidence of pulmonary consolidation. COPD. Aortic atherosclerosis. Electronically Signed   By: Earle Gell M.D.   On: 06/27/2016 10:45   Ct Head Wo Contrast  Result Date: 06/27/2016 CLINICAL DATA:  Blurred vision today in patient presenting with sepsis and cellulitis. EXAM: CT HEAD WITHOUT CONTRAST TECHNIQUE: Contiguous axial images were obtained from the base of the skull through the vertex without intravenous contrast. COMPARISON:  Brain MRI 12/27/2011. FINDINGS: Brain: Atrophy and extensive chronic microvascular ischemic change are seen. No evidence  of acute intracranial abnormality including hemorrhage, infarct, mass lesion, mass effect, midline shift or abnormal extra-axial fluid collection. No hydrocephalus or pneumocephalus. Vascular: Extensive atherosclerosis is seen. Skull: Intact. Sinuses/Orbits: Status post bilateral lens extraction. Otherwise negative. Other: None. IMPRESSION: No acute  abnormality. Marked atrophy and extensive chronic microvascular ischemic change. Electronically Signed   By: Inge Rise M.D.   On: 06/27/2016 10:50   Dg Chest Port 1 View  Result Date: 06/26/2016 CLINICAL DATA:  Shortness of breath. EXAM: PORTABLE CHEST 1 VIEW COMPARISON:  02/04/2012 . FINDINGS: Cardiomegaly with pulmonary vascular prominence and bilateral interstitial prominence consistent with congestive heart failure. Small bilateral pleural effusions. No pneumothorax . IMPRESSION: Congestive heart failure with pulmonary interstitial edema and small bilateral pleural effusions. Electronically Signed   By: [place]   On: 06/26/2016 10:10    Medications:  Prior to Admission:  Prescriptions Prior to Admission  Medication Sig Dispense Refill Last Dose  . ALPRAZolam (XANAX) 0.25 MG tablet Take 0.25 mg by mouth 5 (five) times daily as needed for anxiety.    unknown  . CRANBERRY PO Take 1 tablet by mouth daily.   Past Week at Unknown time  . Docusate Calcium (STOOL SOFTENER PO) Take 1 tablet by mouth 2 (two) times daily.   Past Week at Unknown time  . escitalopram (LEXAPRO) 10 MG tablet Take 1 tablet by mouth daily.   Past Week at Unknown time  . HYDROcodone-acetaminophen (NORCO/VICODIN) 5-325 MG per tablet Take 1 tablet by mouth every 4 (four) hours as needed.   Past Week at Unknown time  . Meth-Hyo-M Bl-Na Phos-Ph Sal (URIBEL) 118 MG CAPS Take 118 capsules by mouth as needed.   Past Week at Unknown time  . mirabegron ER (MYRBETRIQ) 50 MG TB24 tablet Take 50 mg by mouth as needed.   unknown  . NITRO-BID 2 % ointment APPLY A PEA SIZE AMOUNT TO THE ANAL CAVITY EVERY 6 HOURS AS NEEDED. 30 g 0 unknown  . NON FORMULARY Potassium   OTC    One daily   Past Week at Unknown time  . polyethylene glycol (MIRALAX / GLYCOLAX) packet Take 17 g by mouth daily as needed.   Past Month at Unknown time  . SPIRIVA HANDIHALER 18 MCG inhalation capsule Place 1 capsule into inhaler and inhale daily.   Past  Week at Unknown time  . torsemide (DEMADEX) 100 MG tablet Take 0.5 tablets by mouth daily.   Past Week at Unknown time  . Tripelennamine HCl POWD Take 25 mg by mouth 2 (two) times daily.    Past Week at Unknown time  . valsartan (DIOVAN) 80 MG tablet Take 80 mg by mouth daily.     Past Week at Unknown time  . Wheat Dextrin (BENEFIBER PO) Take 1 tablet by mouth daily.   Past Week at Unknown time   Scheduled: . albuterol  2.5 mg Nebulization Q6H  . ALPRAZolam  0.25 mg Oral 5 X Daily  . clindamycin (CLEOCIN) IV  600 mg Intravenous Q8H  . docusate sodium  100 mg Oral BID  . enoxaparin (LOVENOX) injection  40 mg Subcutaneous Q24H  . escitalopram  10 mg Oral Daily  . furosemide  40 mg Intravenous Q12H  . irbesartan  150 mg Oral Daily  . mirabegron ER  50 mg Oral Daily  . polyethylene glycol  17 g Oral Daily  . tiotropium  1 capsule Inhalation Daily   Continuous: . sodium chloride 10 mL/hr at 06/26/16 0943   [id number] **  OR** acetaminophen, HYDROcodone-acetaminophen, morphine injection, ondansetron **OR** ondansetron (ZOFRAN) IV, senna-docusate, traZODone  Assesment: He was admitted with cellulitis of his right leg with some sepsis. He has COPD exacerbation. Chest x-ray which I personally reviewed has been read as heart failure. Clinically that seems less likely. I wonder if he's aspirated. Principal Problem:   Cellulitis of right leg Active Problems:   Sepsis ([place])   HTN (hypertension)   Neurogenic bladder   Chronic indwelling Foley catheter    Plan: Speech evaluation. PT consultation. I'm going to go ahead and put him on some Lasix and see if it makes any difference clinically. Check echocardiogram.    LOS: 4 days   Tarrin Lebow L 06/28/2016, 9:17 AM

## 2016-06-28 NOTE — Progress Notes (Signed)
  Echocardiogram 2D Echocardiogram has been performed.  Erik Atkins 06/28/2016, 2:25 PM

## 2016-06-28 NOTE — Care Management Important Message (Signed)
Important Message  Patient Details  Name: Erik Atkins MRN: [id number] Date of Birth: 04/17/1941   Medicare Important Message Given:  Yes    Sherald Barge, RN 06/28/2016, 12:43 PM

## 2016-06-28 NOTE — Evaluation (Signed)
Clinical/Bedside Swallow Evaluation Patient Details  Name: Erik Atkins MRN: [id number] Date of Birth: April 16, 1941  Today's Date: 06/28/2016 Time: SLP Start Time (ACUTE ONLY): 1000 SLP Stop Time (ACUTE ONLY): 1040 SLP Time Calculation (min) (ACUTE ONLY): 40 min  Past Medical History:  Past Medical History:  Diagnosis Date  . Anxiety   . Charcot Marie Tooth muscular atrophy   . COPD (chronic obstructive pulmonary disease) ([place])   . Hemorrhoids   . History of pneumonia   . Hypertension   . Nerve disorder 1996   "[place]"  . Neuropathy ([place])   . Tubular adenoma    Past Surgical History:  Past Surgical History:  Procedure Laterality Date  . CATARACT EXTRACTION, BILATERAL    . COLONOSCOPY  04/16/2010   Dr.Rourk- multiple cecal polyps, remainder of the colonic mucosa and rectal mucosa appeared normal. internal hemorrhoids bx= tubular adenoma  . MASTECTOMY FOR GYNECOMASTIA  1979 and 1981  . POSTERIOR CERVICAL FUSION/FORAMINOTOMY  02/06/2012   Procedure: POSTERIOR CERVICAL FUSION/FORAMINOTOMY LEVEL 2;  Surgeon: Ophelia Charter, MD;  Location: [facility];  Service: Neurosurgery;  Laterality: N/A;  Posterior Cervical Three-Four/Four-Five Laminectomy and Fusion with Instrumentation  . TONSILLECTOMY     HPI:  Erik Atkins is a 76 y.o. 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 This states that last night at around midnight he complained of rectal pain, while straining on the toilet his Foley catheter was accidentally dislodged and she called EMS for transport to the hospital to have the Foley replaced. Upon arrival he was noticed to have what appears to be a right leg cellulitis with signs of sepsis including hypotension, tachycardia, and a lactic acid of 2.54. CT scan of the abdomen was done given his rectal pain that was essentially negative for acute pathologies,urinalysis  urinalysis shows many bacteria with too numerous to count WBCs but negative nitrates. Admission requested   Assessment / Plan / Recommendation Clinical Impression  Pt seen upright in bed for clinical swallow evaluation. Pt reports that he always sits up in a chair at home to eat. He reports some coughing when drinking water here in the hospital. Pt reports that he is much "weaker" since being in bed all week. Pt also complains of mild pain with swallow (near uvulae), however SLP unable to identify any signs of thrush etc. Oral motor examination reveals generalized weakness, xerostomic tongue, and difficulty initiating a dry swallow. Pt provided with ice chips to aid xerostomia, which improved ability to initiate swallows. Pt assessed with ice chips, thin water via cup and straw sips, puree, and graham crackers. Pt with occasional throat clearing and mild lingual residue following bites of graham crackers. No overt coughing today, however given pt report of coughing in hospital, advised Pt to take small sips (straw/cup) and assist for feeding given current bed bound status. Pt is at risk for aspiration given current deconditioning, difficulty with self feeding in bed, and COPD. Recommend D3/mech soft with thin liquids with feeder assist when pt is repositioned upright. Pt expressed an interest in sitting up in chair, PT eval has already been ordered. SLP will follow during acute stay.   Aspiration Risk  Mild aspiration risk;Moderate aspiration risk    Diet Recommendation Dysphagia 3 (Mech soft);Thin liquid   Liquid Administration via: Cup;Straw Medication Administration: Whole meds with puree Supervision: Staff to assist with self feeding;Full supervision/cueing for compensatory strategies Compensations: Slow rate;Small sips/bites;Multiple dry swallows after  each bite/sip;Follow solids with liquid Postural Changes: Seated upright at 90 degrees;Remain upright for at least 30 minutes after po intake     Other  Recommendations Oral Care Recommendations: Oral care BID;Staff/trained caregiver to provide oral care Other Recommendations: Clarify dietary restrictions   Follow up Recommendations  (pending clinical course)      Frequency and Duration min 2x/week  1 week       Prognosis Prognosis for Safe Diet Advancement: Good      Swallow Study   General Date of Onset: 05/28/16 HPI: Erik Atkins is a 76 y.o. 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 This states that last night at around midnight he complained of rectal pain, while straining on the toilet his Foley catheter was accidentally dislodged and she called EMS for transport to the hospital to have the Foley replaced. Upon arrival he was noticed to have what appears to be a right leg cellulitis with signs of sepsis including hypotension, tachycardia, and a lactic acid of 2.54. CT scan of the abdomen was done given his rectal pain that was essentially negative for acute pathologies,urinalysis urinalysis shows many bacteria with too numerous to count WBCs but negative nitrates. Admission requested Type of Study: Bedside Swallow Evaluation Previous Swallow Assessment: None on record Diet Prior to this Study: Regular;Thin liquids Temperature Spikes Noted: No Respiratory Status: Nasal cannula History of Recent Intubation: No Behavior/Cognition: Alert;Cooperative;Pleasant mood Oral Cavity Assessment: Dry Oral Care Completed by SLP: Recent completion by staff Oral Cavity - Dentition: Adequate natural dentition Vision: Functional for self-feeding (peripheral muscle weakness) Self-Feeding Abilities: Needs assist (feeds self at home up in chair, weaker now so will need assi) Patient Positioning: Upright in bed Baseline Vocal Quality: Normal Volitional Cough: Congested;Strong Volitional Swallow: Able to elicit     Oral/Motor/Sensory Function Overall Oral Motor/Sensory Function: Within functional limits   Ice Chips Ice chips: Within functional limits Presentation: Spoon   Thin Liquid Thin Liquid: Within functional limits Presentation: Cup;Straw    Nectar Thick Nectar Thick Liquid: Not tested   Honey Thick Honey Thick Liquid: Not tested   Puree Puree: Within functional limits Presentation: Spoon   Solid   Thank you,  Genene Churn, CCC-SLP [phone]    Solid: Impaired Presentation: Spoon Oral Phase Functional Implications: Oral residue Pharyngeal Phase Impairments: Throat Clearing - Delayed        Abrey Bradway 06/28/2016,10:49 AM

## 2016-06-29 ENCOUNTER — Other Ambulatory Visit: Payer: Self-pay

## 2016-06-29 DIAGNOSIS — I509 Heart failure, unspecified: Secondary | ICD-10-CM

## 2016-06-29 DIAGNOSIS — I4891 Unspecified atrial fibrillation: Secondary | ICD-10-CM | POA: Diagnosis not present

## 2016-06-29 LAB — CBC WITH DIFFERENTIAL/PLATELET
BASOS PCT: 0 %
Basophils Absolute: 0 10*3/uL (ref 0.0–0.1)
EOS ABS: 0 10*3/uL (ref 0.0–0.7)
Eosinophils Relative: 0 %
HCT: 32.2 % — ABNORMAL LOW (ref 39.0–52.0)
HEMOGLOBIN: 11 g/dL — AB (ref 13.0–17.0)
Lymphocytes Relative: 9 %
Lymphs Abs: 1.4 10*3/uL (ref 0.7–4.0)
MCH: 33.4 pg (ref 26.0–34.0)
MCHC: 34.2 g/dL (ref 30.0–36.0)
MCV: 97.9 fL (ref 78.0–100.0)
Monocytes Absolute: 1.4 10*3/uL — ABNORMAL HIGH (ref 0.1–1.0)
Monocytes Relative: 9 %
NEUTROS PCT: 82 %
Neutro Abs: 13.2 10*3/uL — ABNORMAL HIGH (ref 1.7–7.7)
PLATELETS: 256 10*3/uL (ref 150–400)
RBC: 3.29 MIL/uL — AB (ref 4.22–5.81)
RDW: 13.4 % (ref 11.5–15.5)
WBC: 15.9 10*3/uL — AB (ref 4.0–10.5)

## 2016-06-29 LAB — BASIC METABOLIC PANEL
Anion gap: 9 (ref 5–15)
BUN: 29 mg/dL — ABNORMAL HIGH (ref 6–20)
CHLORIDE: 103 mmol/L (ref 101–111)
CO2: 24 mmol/L (ref 22–32)
CREATININE: 1.56 mg/dL — AB (ref 0.61–1.24)
Calcium: 7.6 mg/dL — ABNORMAL LOW (ref 8.9–10.3)
GFR, EST AFRICAN AMERICAN: 48 mL/min — AB (ref >60)
GFR, EST NON AFRICAN AMERICAN: 41 mL/min — AB (ref >60)
Glucose, Bld: 116 mg/dL — ABNORMAL HIGH (ref 65–99)
POTASSIUM: 3.2 mmol/L — AB (ref 3.5–5.1)
Sodium: 136 mmol/L (ref 135–145)

## 2016-06-29 LAB — CULTURE, BLOOD (ROUTINE X 2)
Culture: NO GROWTH
Culture: NO GROWTH

## 2016-06-29 MED ORDER — PHENOL 1.4 % MT LIQD
1.0000 | OROMUCOSAL | Status: DC | PRN
Start: 2016-06-29 — End: 2016-07-06
  Administered 2016-06-29: 1 via OROMUCOSAL
  Filled 2016-06-29: qty 177

## 2016-06-29 MED ORDER — POTASSIUM CHLORIDE CRYS ER 20 MEQ PO TBCR
20.0000 meq | EXTENDED_RELEASE_TABLET | Freq: Two times a day (BID) | ORAL | Status: DC
  Administered 2016-06-29 (×2): 20 meq via ORAL
  Filled 2016-06-29 (×2): qty 1

## 2016-06-29 MED ORDER — DILTIAZEM HCL 60 MG PO TABS
60.0000 mg | ORAL_TABLET | Freq: Four times a day (QID) | ORAL | Status: DC
  Administered 2016-06-29: 60 mg via ORAL
  Filled 2016-06-29 (×2): qty 1

## 2016-06-29 NOTE — Progress Notes (Signed)
Subjective: He's doing a little better this morning. Echocardiogram showed atrial fib. When I examined him yesterday his heart was regular at that time. He has diuresed some. He still has a lot of trouble with his legs. Discussed CODE STATUS and I believe he wants to go ahead and continue with full CODE STATUS. He does not have a urinary tract infection so I think his sepsis is related to the cellulitis  Objective: Vital signs in last 24 hours: Temp:  [98.3 F (36.8 C)-99.4 F (37.4 C)] 98.3 F (36.8 C) (02/03 0443) Pulse Rate:  [69-97] 97 (02/03 0443) Resp:  [18] 18 (02/03 0443) BP: (149-165)/(66-85) 149/85 (02/03 0443) SpO2:  [90 %-99 %] 95 % (02/03 0738) Weight change:  Last BM Date: 06/27/16  Intake/Output from previous day: 02/02 0701 - 02/03 0700 In: 749.3 [P.O.:360; I.V.:239.3; IV Piggyback:150] Out: 1350 [Urine:1350]  PHYSICAL EXAM General appearance: alert, cooperative and moderate distress Resp: He still has rhonchi but is significantly clearer than yesterday Cardio: irregularly irregular rhythm GI: soft, non-tender; bowel sounds normal; no masses,  no organomegaly Extremities: His leg wounds are slowly clearing Skin warm and dry. Mucous membranes are moist  Lab Results:  Results for orders placed or performed during the hospital encounter of 06/24/16 (from the past 48 hour(s))  CBC with Differential/Platelet     Status: Abnormal   Collection Time: 06/29/16  6:51 AM  Result Value Ref Range   WBC 15.9 (H) 4.0 - 10.5 K/uL   RBC 3.29 (L) 4.22 - 5.81 MIL/uL   Hemoglobin 11.0 (L) 13.0 - 17.0 g/dL   HCT 32.2 (L) 39.0 - 52.0 %   MCV 97.9 78.0 - 100.0 fL   MCH 33.4 26.0 - 34.0 pg   MCHC 34.2 30.0 - 36.0 g/dL   RDW 13.4 11.5 - 15.5 %   Platelets 256 150 - 400 K/uL   Neutrophils Relative % 82 %   Neutro Abs 13.2 (H) 1.7 - 7.7 K/uL   Lymphocytes Relative 9 %   Lymphs Abs 1.4 0.7 - 4.0 K/uL   Monocytes Relative 9 %   Monocytes Absolute 1.4 (H) 0.1 - 1.0 K/uL   Eosinophils Relative 0 %   Eosinophils Absolute 0.0 0.0 - 0.7 K/uL   Basophils Relative 0 %   Basophils Absolute 0.0 0.0 - 0.1 K/uL  Basic metabolic panel     Status: Abnormal   Collection Time: 06/29/16  6:51 AM  Result Value Ref Range   Sodium 136 135 - 145 mmol/L   Potassium 3.2 (L) 3.5 - 5.1 mmol/L   Chloride 103 101 - 111 mmol/L   CO2 24 22 - 32 mmol/L   Glucose, Bld 116 (H) 65 - 99 mg/dL   BUN 29 (H) 6 - 20 mg/dL   Creatinine, Ser 1.56 (H) 0.61 - 1.24 mg/dL   Calcium 7.6 (L) 8.9 - 10.3 mg/dL   GFR calc non Af Amer 41 (L) >60 mL/min   GFR calc Af Amer 48 (L) >60 mL/min    Comment: (NOTE) The eGFR has been calculated using the CKD EPI equation. This calculation has not been validated in all clinical situations. eGFR's persistently <60 mL/min signify possible Chronic Kidney Disease.    Anion gap 9 5 - 15    ABGS No results for input(s): PHART, PO2ART, TCO2, HCO3 in the last 72 hours.  Invalid input(s): PCO2 CULTURES Recent Results (from the past 240 hour(s))  Urine culture     Status: Abnormal   Collection Time: 06/24/16  6:24 AM  Result Value Ref Range Status   Specimen Description URINE, CLEAN CATCH  Final   Special Requests NONE  Final   Culture (A)  Final    <10,000 COLONIES/mL INSIGNIFICANT GROWTH Performed at [facility], [address] [place], [place] [address]    Report Status 06/25/2016 FINAL  Final  Blood culture (routine x 2)     Status: None (Preliminary result)   Collection Time: 06/24/16  8:13 AM  Result Value Ref Range Status   Specimen Description BLOOD RIGHT HAND  Final   Special Requests BOTTLES DRAWN AEROBIC AND ANAEROBIC 6CC  Final   Culture NO GROWTH 4 DAYS  Final   Report Status PENDING  Incomplete  Blood culture (routine x 2)     Status: None (Preliminary result)   Collection Time: 06/24/16  8:22 AM  Result Value Ref Range Status   Specimen Description BLOOD LEFT ARM  Final   Special Requests BOTTLES DRAWN AEROBIC AND ANAEROBIC  6CC  Final   Culture NO GROWTH 4 DAYS  Final   Report Status PENDING  Incomplete   Studies/Results: No results found.  Medications:  Prior to Admission:  Prescriptions Prior to Admission  Medication Sig Dispense Refill Last Dose  . ALPRAZolam (XANAX) 0.25 MG tablet Take 0.25 mg by mouth 5 (five) times daily as needed for anxiety.    unknown  . CRANBERRY PO Take 1 tablet by mouth daily.   Past Week at Unknown time  . Docusate Calcium (STOOL SOFTENER PO) Take 1 tablet by mouth 2 (two) times daily.   Past Week at Unknown time  . escitalopram (LEXAPRO) 10 MG tablet Take 1 tablet by mouth daily.   Past Week at Unknown time  . HYDROcodone-acetaminophen (NORCO/VICODIN) 5-325 MG per tablet Take 1 tablet by mouth every 4 (four) hours as needed.   Past Week at Unknown time  . Meth-Hyo-M Bl-Na Phos-Ph Sal (URIBEL) 118 MG CAPS Take 118 capsules by mouth as needed.   Past Week at Unknown time  . mirabegron ER (MYRBETRIQ) 50 MG TB24 tablet Take 50 mg by mouth as needed.   unknown  . NITRO-BID 2 % ointment APPLY A PEA SIZE AMOUNT TO THE ANAL CAVITY EVERY 6 HOURS AS NEEDED. 30 g 0 unknown  . NON FORMULARY Potassium   OTC    One daily   Past Week at Unknown time  . polyethylene glycol (MIRALAX / GLYCOLAX) packet Take 17 g by mouth daily as needed.   Past Month at Unknown time  . SPIRIVA HANDIHALER 18 MCG inhalation capsule Place 1 capsule into inhaler and inhale daily.   Past Week at Unknown time  . torsemide (DEMADEX) 100 MG tablet Take 0.5 tablets by mouth daily.   Past Week at Unknown time  . Tripelennamine HCl POWD Take 25 mg by mouth 2 (two) times daily.    Past Week at Unknown time  . valsartan (DIOVAN) 80 MG tablet Take 80 mg by mouth daily.     Past Week at Unknown time  . Wheat Dextrin (BENEFIBER PO) Take 1 tablet by mouth daily.   Past Week at Unknown time   Scheduled: . albuterol  2.5 mg Nebulization TID  . ALPRAZolam  0.25 mg Oral 5 X Daily  . clindamycin (CLEOCIN) IV  600 mg Intravenous Q8H   . diltiazem  60 mg Oral Q6H  . docusate sodium  100 mg Oral BID  . enoxaparin (LOVENOX) injection  40 mg Subcutaneous Q24H  .  escitalopram  10 mg Oral Daily  . furosemide  40 mg Intravenous Q12H  . irbesartan  150 mg Oral Daily  . mirabegron ER  50 mg Oral Daily  . polyethylene glycol  17 g Oral Daily  . tiotropium  1 capsule Inhalation Daily   Continuous: . sodium chloride 10 mL/hr at 06/26/16 0816   EHA:[id number] **OR** acetaminophen, HYDROcodone-acetaminophen, morphine injection, ondansetron **OR** ondansetron (ZOFRAN) IV, senna-docusate, traZODone  Assesment: He was admitted with cellulitis of the right leg with sepsis from that. He is known to have hypertension. He has atrial fib with RVR and I'm going to put him on oral diltiazem because he is really not very symptomatic. I think he has some congestive heart failure but echocardiogram was not really conclusive. It was thought that he had a urinary tract infection but he does not Principal Problem:   Cellulitis of right leg Active Problems:   Sepsis ([place])   HTN (hypertension)   Neurogenic bladder   Chronic indwelling Foley catheter   Atrial fibrillation with rapid ventricular response (HCC)   Congestive heart failure ([place])    Plan: Add diltiazem. Continue other treatments. Continue Lasix for now.    LOS: 5 days   Erik Atkins L 06/29/2016, 11:20 AM

## 2016-06-30 LAB — BASIC METABOLIC PANEL
Anion gap: 9 (ref 5–15)
BUN: 37 mg/dL — AB (ref 6–20)
CALCIUM: 7.5 mg/dL — AB (ref 8.9–10.3)
CO2: 22 mmol/L (ref 22–32)
Chloride: 106 mmol/L (ref 101–111)
Creatinine, Ser: 1.76 mg/dL — ABNORMAL HIGH (ref 0.61–1.24)
GFR calc Af Amer: 41 mL/min — ABNORMAL LOW (ref >60)
GFR, EST NON AFRICAN AMERICAN: 36 mL/min — AB (ref >60)
GLUCOSE: 97 mg/dL (ref 65–99)
POTASSIUM: 3.8 mmol/L (ref 3.5–5.1)
SODIUM: 137 mmol/L (ref 135–145)

## 2016-06-30 LAB — CBC WITH DIFFERENTIAL/PLATELET
BASOS PCT: 0 %
Basophils Absolute: 0 10*3/uL (ref 0.0–0.1)
EOS ABS: 0.2 10*3/uL (ref 0.0–0.7)
EOS PCT: 1 %
HCT: 34.1 % — ABNORMAL LOW (ref 39.0–52.0)
HEMOGLOBIN: 11.5 g/dL — AB (ref 13.0–17.0)
Lymphocytes Relative: 12 %
Lymphs Abs: 1.7 10*3/uL (ref 0.7–4.0)
MCH: 33.9 pg (ref 26.0–34.0)
MCHC: 33.7 g/dL (ref 30.0–36.0)
MCV: 100.6 fL — ABNORMAL HIGH (ref 78.0–100.0)
MONOS PCT: 10 %
Monocytes Absolute: 1.4 10*3/uL — ABNORMAL HIGH (ref 0.1–1.0)
NEUTROS PCT: 76 %
Neutro Abs: 10.6 10*3/uL — ABNORMAL HIGH (ref 1.7–7.7)
PLATELETS: 224 10*3/uL (ref 150–400)
RBC: 3.39 MIL/uL — AB (ref 4.22–5.81)
RDW: 13.6 % (ref 11.5–15.5)
WBC: 13.9 10*3/uL — AB (ref 4.0–10.5)

## 2016-06-30 LAB — MRSA PCR SCREENING: MRSA BY PCR: NEGATIVE

## 2016-06-30 MED ORDER — DILTIAZEM HCL 25 MG/5ML IV SOLN
10.0000 mg | Freq: Once | INTRAVENOUS | Status: AC
  Administered 2016-06-30: 10 mg via INTRAVENOUS
  Filled 2016-06-30: qty 5

## 2016-06-30 MED ORDER — POTASSIUM CHLORIDE CRYS ER 20 MEQ PO TBCR
40.0000 meq | EXTENDED_RELEASE_TABLET | Freq: Two times a day (BID) | ORAL | Status: DC
  Administered 2016-06-30 – 2016-07-02 (×6): 40 meq via ORAL
  Filled 2016-06-30 (×2): qty 2
  Filled 2016-06-30: qty 4
  Filled 2016-06-30 (×3): qty 2

## 2016-06-30 MED ORDER — DIGOXIN 0.25 MG/ML IJ SOLN
0.2500 mg | Freq: Once | INTRAMUSCULAR | Status: AC
  Administered 2016-06-30: 0.25 mg via INTRAVENOUS
  Filled 2016-06-30: qty 2

## 2016-06-30 MED ORDER — ORAL CARE MOUTH RINSE
15.0000 mL | Freq: Two times a day (BID) | OROMUCOSAL | Status: DC
  Administered 2016-06-30 – 2016-07-06 (×11): 15 mL via OROMUCOSAL

## 2016-06-30 MED ORDER — DILTIAZEM HCL-DEXTROSE 100-5 MG/100ML-% IV SOLN (PREMIX)
5.0000 mg/h | INTRAVENOUS | Status: DC
  Administered 2016-06-30 (×2): 15 mg/h via INTRAVENOUS
  Administered 2016-06-30 – 2016-07-01 (×2): 10 mg/h via INTRAVENOUS
  Administered 2016-07-01: 15 mg/h via INTRAVENOUS
  Filled 2016-06-30 (×5): qty 100

## 2016-06-30 MED ORDER — APIXABAN 5 MG PO TABS
5.0000 mg | ORAL_TABLET | Freq: Two times a day (BID) | ORAL | Status: DC
  Administered 2016-06-30 – 2016-07-01 (×3): 5 mg via ORAL
  Filled 2016-06-30 (×4): qty 1

## 2016-06-30 MED ORDER — DILTIAZEM LOAD VIA INFUSION
10.0000 mg | Freq: Once | INTRAVENOUS | Status: AC
  Administered 2016-06-30: 10 mg via INTRAVENOUS
  Filled 2016-06-30: qty 10

## 2016-06-30 NOTE — Plan of Care (Signed)
Problem: Safety: Goal: Ability to remain free from injury will improve Outcome: Progressing PT STATES THAT HE USES A WALKER W/ ATTATCHED SEAT AT HOME FOR AMBULATION TO PREVENT FALLS.

## 2016-06-30 NOTE — Progress Notes (Signed)
Subjective: He was transferred to stepdown yesterday because his heart rate wasn't controlled with oral diltiazem. He is on IV diltiazem and is still having elevated heart rate. He says he feels a little better this morning. We discussed CODE STATUS yesterday and he does want to continue full CODE STATUS. He says his breathing is better.  Objective: Vital signs in last 24 hours: Temp:  [97.7 F (36.5 C)-98.7 F (37.1 C)] 98.7 F (37.1 C) (02/04 0816) Pulse Rate:  [106-110] 110 (02/03 2216) Resp:  [18-26] 26 (02/04 0816) BP: (77-163)/(46-100) 163/63 (02/04 0851) SpO2:  [89 %-98 %] 97 % (02/04 0816) Weight:  [60.1 kg (132 lb 7.9 oz)] 60.1 kg (132 lb 7.9 oz) (02/04 0218) Weight change:  Last BM Date: 06/30/16  Intake/Output from previous day: 02/03 0701 - 02/04 0700 In: 380.2 [P.O.:170; I.V.:160.2; IV Piggyback:50] Out: 900 [Urine:900]  PHYSICAL EXAM General appearance: alert, cooperative and moderate distress Resp: He has bilateral rhonchi but his chest is clearer than yesterday Cardio: Irregularly irregular still with tachycardia GI: soft, non-tender; bowel sounds normal; no masses,  no organomegaly Extremities: His leg wounds are looking better Skin other than on his legs is better. Mucous membranes are moist.  Lab Results:  Results for orders placed or performed during the hospital encounter of 06/24/16 (from the past 48 hour(s))  CBC with Differential/Platelet     Status: Abnormal   Collection Time: 06/29/16  6:51 AM  Result Value Ref Range   WBC 15.9 (H) 4.0 - 10.5 K/uL   RBC 3.29 (L) 4.22 - 5.81 MIL/uL   Hemoglobin 11.0 (L) 13.0 - 17.0 g/dL   HCT 32.2 (L) 39.0 - 52.0 %   MCV 97.9 78.0 - 100.0 fL   MCH 33.4 26.0 - 34.0 pg   MCHC 34.2 30.0 - 36.0 g/dL   RDW 13.4 11.5 - 15.5 %   Platelets 256 150 - 400 K/uL   Neutrophils Relative % 82 %   Neutro Abs 13.2 (H) 1.7 - 7.7 K/uL   Lymphocytes Relative 9 %   Lymphs Abs 1.4 0.7 - 4.0 K/uL   Monocytes Relative 9 %   Monocytes Absolute 1.4 (H) 0.1 - 1.0 K/uL   Eosinophils Relative 0 %   Eosinophils Absolute 0.0 0.0 - 0.7 K/uL   Basophils Relative 0 %   Basophils Absolute 0.0 0.0 - 0.1 K/uL  Basic metabolic panel     Status: Abnormal   Collection Time: 06/29/16  6:51 AM  Result Value Ref Range   Sodium 136 135 - 145 mmol/L   Potassium 3.2 (L) 3.5 - 5.1 mmol/L   Chloride 103 101 - 111 mmol/L   CO2 24 22 - 32 mmol/L   Glucose, Bld 116 (H) 65 - 99 mg/dL   BUN 29 (H) 6 - 20 mg/dL   Creatinine, Ser 1.56 (H) 0.61 - 1.24 mg/dL   Calcium 7.6 (L) 8.9 - 10.3 mg/dL   GFR calc non Af Amer 41 (L) >60 mL/min   GFR calc Af Amer 48 (L) >60 mL/min    Comment: (NOTE) The eGFR has been calculated using the CKD EPI equation. This calculation has not been validated in all clinical situations. eGFR's persistently <60 mL/min signify possible Chronic Kidney Disease.    Anion gap 9 5 - 15  MRSA PCR Screening     Status: None   Collection Time: 06/30/16  2:13 AM  Result Value Ref Range   MRSA by PCR NEGATIVE NEGATIVE    Comment:  The GeneXpert MRSA Assay (FDA approved for NASAL specimens only), is one component of a comprehensive MRSA colonization surveillance program. It is not intended to diagnose MRSA infection nor to guide or monitor treatment for MRSA infections.   CBC with Differential/Platelet     Status: Abnormal   Collection Time: 06/30/16  4:17 AM  Result Value Ref Range   WBC 13.9 (H) 4.0 - 10.5 K/uL   RBC 3.39 (L) 4.22 - 5.81 MIL/uL   Hemoglobin 11.5 (L) 13.0 - 17.0 g/dL   HCT 34.1 (L) 39.0 - 52.0 %   MCV 100.6 (H) 78.0 - 100.0 fL   MCH 33.9 26.0 - 34.0 pg   MCHC 33.7 30.0 - 36.0 g/dL   RDW 13.6 11.5 - 15.5 %   Platelets 224 150 - 400 K/uL    Comment: SPECIMEN CHECKED FOR CLOTS PLATELET COUNT CONFIRMED BY SMEAR    Neutrophils Relative % 76 %   Neutro Abs 10.6 (H) 1.7 - 7.7 K/uL   Lymphocytes Relative 12 %   Lymphs Abs 1.7 0.7 - 4.0 K/uL   Monocytes Relative 10 %   Monocytes  Absolute 1.4 (H) 0.1 - 1.0 K/uL   Eosinophils Relative 1 %   Eosinophils Absolute 0.2 0.0 - 0.7 K/uL   Basophils Relative 0 %   Basophils Absolute 0.0 0.0 - 0.1 K/uL  Basic metabolic panel     Status: Abnormal   Collection Time: 06/30/16  7:29 AM  Result Value Ref Range   Sodium 137 135 - 145 mmol/L   Potassium 3.8 3.5 - 5.1 mmol/L   Chloride 106 101 - 111 mmol/L   CO2 22 22 - 32 mmol/L   Glucose, Bld 97 65 - 99 mg/dL   BUN 37 (H) 6 - 20 mg/dL   Creatinine, Ser 1.76 (H) 0.61 - 1.24 mg/dL   Calcium 7.5 (L) 8.9 - 10.3 mg/dL   GFR calc non Af Amer 36 (L) >60 mL/min   GFR calc Af Amer 41 (L) >60 mL/min    Comment: (NOTE) The eGFR has been calculated using the CKD EPI equation. This calculation has not been validated in all clinical situations. eGFR's persistently <60 mL/min signify possible Chronic Kidney Disease.    Anion gap 9 5 - 15    ABGS No results for input(s): PHART, PO2ART, TCO2, HCO3 in the last 72 hours.  Invalid input(s): PCO2 CULTURES Recent Results (from the past 240 hour(s))  Urine culture     Status: Abnormal   Collection Time: 06/24/16  6:24 AM  Result Value Ref Range Status   Specimen Description URINE, CLEAN CATCH  Final   Special Requests NONE  Final   Culture (A)  Final    <10,000 COLONIES/mL INSIGNIFICANT GROWTH Performed at [facility], [address], [place], [place] [address]    Report Status 06/25/2016 FINAL  Final  Blood culture (routine x 2)     Status: None   Collection Time: 06/24/16  8:13 AM  Result Value Ref Range Status   Specimen Description BLOOD RIGHT HAND  Final   Special Requests BOTTLES DRAWN AEROBIC AND ANAEROBIC 6CC  Final   Culture NO GROWTH 5 DAYS  Final   Report Status 06/29/2016 FINAL  Final  Blood culture (routine x 2)     Status: None   Collection Time: 06/24/16  8:22 AM  Result Value Ref Range Status   Specimen Description BLOOD LEFT ARM  Final   Special Requests BOTTLES DRAWN AEROBIC AND ANAEROBIC 6CC  Final    Culture NO GROWTH 5 DAYS  Final   Report Status 06/29/2016 FINAL  Final  MRSA PCR Screening     Status: None   Collection Time: 06/30/16  2:13 AM  Result Value Ref Range Status   MRSA by PCR NEGATIVE NEGATIVE Final    Comment:        The GeneXpert MRSA Assay (FDA approved for NASAL specimens only), is one component of a comprehensive MRSA colonization surveillance program. It is not intended to diagnose MRSA infection nor to guide or monitor treatment for MRSA infections.    Studies/Results: No results found.  Medications:  Prior to Admission:  Prescriptions Prior to Admission  Medication Sig Dispense Refill Last Dose  . ALPRAZolam (XANAX) 0.25 MG tablet Take 0.25 mg by mouth 5 (five) times daily as needed for anxiety.    unknown  . CRANBERRY PO Take 1 tablet by mouth daily.   Past Week at Unknown time  . Docusate Calcium (STOOL SOFTENER PO) Take 1 tablet by mouth 2 (two) times daily.   Past Week at Unknown time  . escitalopram (LEXAPRO) 10 MG tablet Take 1 tablet by mouth daily.   Past Week at Unknown time  . HYDROcodone-acetaminophen (NORCO/VICODIN) 5-325 MG per tablet Take 1 tablet by mouth every 4 (four) hours as needed.   Past Week at Unknown time  . Meth-Hyo-M Bl-Na Phos-Ph Sal (URIBEL) 118 MG CAPS Take 118 capsules by mouth as needed.   Past Week at Unknown time  . mirabegron ER (MYRBETRIQ) 50 MG TB24 tablet Take 50 mg by mouth as needed.   unknown  . NITRO-BID 2 % ointment APPLY A PEA SIZE AMOUNT TO THE ANAL CAVITY EVERY 6 HOURS AS NEEDED. 30 g 0 unknown  . NON FORMULARY Potassium   OTC    One daily   Past Week at Unknown time  . polyethylene glycol (MIRALAX / GLYCOLAX) packet Take 17 g by mouth daily as needed.   Past Month at Unknown time  . SPIRIVA HANDIHALER 18 MCG inhalation capsule Place 1 capsule into inhaler and inhale daily.   Past Week at Unknown time  . torsemide (DEMADEX) 100 MG tablet Take 0.5 tablets by mouth daily.   Past Week at Unknown time  .  Tripelennamine HCl POWD Take 25 mg by mouth 2 (two) times daily.    Past Week at Unknown time  . valsartan (DIOVAN) 80 MG tablet Take 80 mg by mouth daily.     Past Week at Unknown time  . Wheat Dextrin (BENEFIBER PO) Take 1 tablet by mouth daily.   Past Week at Unknown time   Scheduled: . albuterol  2.5 mg Nebulization TID  . ALPRAZolam  0.25 mg Oral 5 X Daily  . apixaban  5 mg Oral BID  . clindamycin (CLEOCIN) IV  600 mg Intravenous Q8H  . docusate sodium  100 mg Oral BID  . escitalopram  10 mg Oral Daily  . furosemide  40 mg Intravenous Q12H  . irbesartan  150 mg Oral Daily  . mouth rinse  15 mL Mouth Rinse BID  . mirabegron ER  50 mg Oral Daily  . polyethylene glycol  17 g Oral Daily  . potassium chloride  40 mEq Oral BID  . tiotropium  1 capsule Inhalation Daily   Continuous: . sodium chloride 10 mL/hr at 06/30/16 0230  . diltiazem (CARDIZEM) infusion 15 mg/hr (06/30/16 0854)   ZYS:[id number] **OR** acetaminophen, HYDROcodone-acetaminophen, morphine injection, ondansetron **OR** ondansetron (ZOFRAN) IV,  phenol, senna-docusate, traZODone  Assesment: He was admitted with cellulitis of the right leg and sepsis from that. He has COPD exacerbation. He has developed atrial fibrillation with rapid ventricular response. His sepsis has improved. He still has tachycardia. I have adjusted his diltiazem dose. He has some element of heart failure. 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 Continue with diltiazem. He may require amiodarone. Not really a candidate  for beta blockers with his COPD. And digoxin at least temporarily    LOS: 6 days   Lorie Cleckley L 06/30/2016, 9:21 AM

## 2016-06-30 NOTE — Progress Notes (Signed)
ANTICOAGULATION CONSULT NOTE - Initial Consult  Pharmacy Consult for eliquis Indication: atrial fibrillation  Allergies  Allergen Reactions  . Penicillins   . Darvon Swelling  . Celexa [Citalopram Hydrobromide] Nausea And Vomiting  . Cortisone Swelling  . Cymbalta [Duloxetine Hcl] Nausea Only  . Eggs Or Egg-Derived Products Nausea And Vomiting  . Guaifenesin Er   . Keflex [Cephalexin] Swelling  . Motrin [Ibuprofen]   . Pumpkin Seed Nausea And Vomiting    All pumpkin products  . Sweet Potato Nausea And Vomiting       . Tea Nausea And Vomiting  . Zoloft [Sertraline] Nausea Only    Patient Measurements: Height: 5\' 7"  (170.2 cm) Weight: 132 lb 7.9 oz (60.1 kg) IBW/kg (Calculated) : 66.1   Vital Signs: Temp: 98.7 F (37.1 C) (02/04 0816) Temp Source: Oral (02/04 0816) BP: 163/63 (02/04 0851) Pulse Rate: 110 (02/03 2216)  Labs:  Recent Labs  06/29/16 0651 06/30/16 0417 06/30/16 0729  HGB 11.0* 11.5*  --   HCT 32.2* 34.1*  --   PLT 256 224  --   CREATININE 1.56*  --  1.76*    Estimated Creatinine Clearance: 29.9 mL/min (by C-G formula based on SCr of 1.76 mg/dL (H)).   Medical History: Past Medical History:  Diagnosis Date  . Anxiety   . Charcot Marie Tooth muscular atrophy   . COPD (chronic obstructive pulmonary disease) ([place])   . Hemorrhoids   . History of pneumonia   . Hypertension   . Nerve disorder 1996   "[place]"  . Neuropathy ([place])   . Tubular adenoma     Medications:  Prescriptions Prior to Admission  Medication Sig Dispense Refill Last Dose  . ALPRAZolam (XANAX) 0.25 MG tablet Take 0.25 mg by mouth 5 (five) times daily as needed for anxiety.    unknown  . CRANBERRY PO Take 1 tablet by mouth daily.   Past Week at Unknown time  . Docusate Calcium (STOOL SOFTENER PO) Take 1 tablet by mouth 2 (two) times daily.   Past Week at Unknown time  . escitalopram (LEXAPRO) 10 MG tablet Take 1 tablet by mouth daily.   Past Week at Unknown time  .  HYDROcodone-acetaminophen (NORCO/VICODIN) 5-325 MG per tablet Take 1 tablet by mouth every 4 (four) hours as needed.   Past Week at Unknown time  . Meth-Hyo-M Bl-Na Phos-Ph Sal (URIBEL) 118 MG CAPS Take 118 capsules by mouth as needed.   Past Week at Unknown time  . mirabegron ER (MYRBETRIQ) 50 MG TB24 tablet Take 50 mg by mouth as needed.   unknown  . NITRO-BID 2 % ointment APPLY A PEA SIZE AMOUNT TO THE ANAL CAVITY EVERY 6 HOURS AS NEEDED. 30 g 0 unknown  . NON FORMULARY Potassium   OTC    One daily   Past Week at Unknown time  . polyethylene glycol (MIRALAX / GLYCOLAX) packet Take 17 g by mouth daily as needed.   Past Month at Unknown time  . SPIRIVA HANDIHALER 18 MCG inhalation capsule Place 1 capsule into inhaler and inhale daily.   Past Week at Unknown time  . torsemide (DEMADEX) 100 MG tablet Take 0.5 tablets by mouth daily.   Past Week at Unknown time  . Tripelennamine HCl POWD Take 25 mg by mouth 2 (two) times daily.    Past Week at Unknown time  . valsartan (DIOVAN) 80 MG tablet Take 80 mg by mouth daily.     Past Week at Unknown time  .  Wheat Dextrin (BENEFIBER PO) Take 1 tablet by mouth daily.   Past Week at Unknown time    Assessment: 76 yo man to start eliquis for afib. CHA2DS2-VASc score 3  Baseline Hg 11.5, PTLC 224 Goal of Therapy:  Therapeutic anticoagulation Monitor platelets by anticoagulation protocol: Yes   Plan:  Eliquis 5 mg po bid F/u CBC and monitor for bleeding complications  Thanks for allowing pharmacy to be a part of this patient's care.  Excell Seltzer, PharmD Clinical Pharmacist 06/30/2016,9:15 AM

## 2016-06-30 NOTE — Progress Notes (Signed)
Found patients HR running at 168 , while given breath ing treatment. Did EKC and notified nurse.

## 2016-07-01 LAB — CBC WITH DIFFERENTIAL/PLATELET
BASOS ABS: 0 10*3/uL (ref 0.0–0.1)
BASOS PCT: 0 %
Eosinophils Absolute: 0.1 10*3/uL (ref 0.0–0.7)
Eosinophils Relative: 1 %
HEMATOCRIT: 34.4 % — AB (ref 39.0–52.0)
Hemoglobin: 11.5 g/dL — ABNORMAL LOW (ref 13.0–17.0)
Lymphocytes Relative: 12 %
Lymphs Abs: 1.6 10*3/uL (ref 0.7–4.0)
MCH: 33.5 pg (ref 26.0–34.0)
MCHC: 33.4 g/dL (ref 30.0–36.0)
MCV: 100.3 fL — ABNORMAL HIGH (ref 78.0–100.0)
MONO ABS: 1.2 10*3/uL — AB (ref 0.1–1.0)
Monocytes Relative: 9 %
NEUTROS ABS: 10.1 10*3/uL — AB (ref 1.7–7.7)
Neutrophils Relative %: 78 %
PLATELETS: 264 10*3/uL (ref 150–400)
RBC: 3.43 MIL/uL — AB (ref 4.22–5.81)
RDW: 13.6 % (ref 11.5–15.5)
WBC: 12.9 10*3/uL — AB (ref 4.0–10.5)

## 2016-07-01 LAB — BASIC METABOLIC PANEL
ANION GAP: 10 (ref 5–15)
BUN: 34 mg/dL — ABNORMAL HIGH (ref 6–20)
CALCIUM: 7.4 mg/dL — AB (ref 8.9–10.3)
CO2: 22 mmol/L (ref 22–32)
Chloride: 103 mmol/L (ref 101–111)
Creatinine, Ser: 1.62 mg/dL — ABNORMAL HIGH (ref 0.61–1.24)
GFR, EST AFRICAN AMERICAN: 46 mL/min — AB (ref >60)
GFR, EST NON AFRICAN AMERICAN: 39 mL/min — AB (ref >60)
GLUCOSE: 123 mg/dL — AB (ref 65–99)
POTASSIUM: 3.6 mmol/L (ref 3.5–5.1)
SODIUM: 135 mmol/L (ref 135–145)

## 2016-07-01 MED ORDER — DILTIAZEM HCL 60 MG PO TABS
60.0000 mg | ORAL_TABLET | Freq: Four times a day (QID) | ORAL | Status: DC
  Administered 2016-07-01 – 2016-07-06 (×20): 60 mg via ORAL
  Filled 2016-07-01 (×21): qty 1

## 2016-07-01 MED ORDER — APIXABAN 2.5 MG PO TABS
2.5000 mg | ORAL_TABLET | Freq: Two times a day (BID) | ORAL | Status: DC
  Administered 2016-07-02 – 2016-07-06 (×10): 2.5 mg via ORAL
  Filled 2016-07-01 (×14): qty 1

## 2016-07-01 NOTE — Evaluation (Signed)
Physical Therapy Evaluation Patient Details Name: Erik Atkins MRN: [id number] DOB: May 14, 1941 Today's Date: 07/01/2016   History of Present Illness  76 yo M admitted due to Rectal pain, and accidental Foley removal.  He was also noted to have cellulitis of the right leg and sepsis from that, and COPD exacerbation. He has developed atrial fibrillation with rapid ventricular response and was transferred to the ICU. His sepsis has improved. He still has tachycardia. 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  Clinical Impression  Pt received in bed, and is agreeable to PT evaluation.  Pt normally ambulates with a rollator walker, and states that he is independent with dressing and performing sponge bathing.  Pt states that he still drives, but does not ambulate in community.  During PT eval he required Mod A for sit<>stand and pivot from the bed<>chair with RW due to strong posterior lean and poor placement of feet during transfer due to ataxia.  He is currently recommended for SNF at d/c.  Pt lives with his wife at home, but she is not able to provide physical assistance for him during functional mobility.  He he does choose to go home, he will need HHPT, as well as a w/c due to high risk for falls.      Follow Up Recommendations SNF (Pt expressed that he does not want to go to SNF.  If he chooses to go home, he will need HHPT and a w/c due to high fall risk.  )    Equipment Recommendations  None recommended by PT    Recommendations for Other Services       Precautions / Restrictions Precautions Precautions: Fall Precaution Comments: Pt states he has not had any falls, however unsure of accuracy of this information.   Restrictions Weight Bearing Restrictions: No      Mobility  Bed Mobility Overal bed mobility: Needs Assistance Bed Mobility: Supine to Sit     Supine to sit: Min assist;HOB elevated     General bed mobility comments:  increased time.   Transfers Overall transfer level: Needs assistance Equipment used: Rolling walker (2 wheeled) Transfers: Sit to/from [facility] Sit to Stand: Mod assist Stand pivot transfers: Mod assist (Pt demonstrates poor foot placement during stepping for transfers. This is partly due to Charcot-Marie-Tooth, and also partly due to pt purposely lifting feet high maching to get stretched out. )       General transfer comment: Pt demonstrates strong posterior lean during transfer, and he is unable to shift hips fwd, instead he leans his head fwd and hips back when instructed to lean fwd.  Pt required PT assistance in order to prevent falling backwards during transfer.  Instructed RN to use STEDY for transfers back to bed.   Ambulation/Gait Ambulation/Gait assistance:  (NA due to poor balance with stransfer. )              Stairs            Wheelchair Mobility    Modified Rankin (Stroke Patients Only)       Balance Overall balance assessment: Needs assistance Sitting-balance support: Bilateral upper extremity supported;Feet supported Sitting balance-Leahy Scale: Good     Standing balance support: Bilateral upper extremity supported Standing balance-Leahy Scale: Poor Standing balance comment: Strong posterior lean                             Pertinent Vitals/Pain  Pain Assessment: Faces Faces Pain Scale: Hurts even more Pain Location: B feet Pain Descriptors / Indicators: Grimacing Pain Intervention(s): Limited activity within patient's tolerance;Monitored during session;Repositioned    Home Living   Living Arrangements: Spouse/significant other Available Help at Discharge: Available 24 hours/day;Family Type of Home: House Home Access: Stairs to enter     Home Layout: One level Home Equipment: Bedside commode;Shower seat;Walker - 4 wheels      Prior Function     Gait / Transfers Assistance Needed: Pt states that he  ambulates with his rollator walker all the time.  Pt also states that he is still driving, but does not ambulate in the community, and has not done so for months or years - but he is not sure which.   ADL's / Homemaking Assistance Needed: Pt reports that he is independent with dressing, and sponge bath.  Wife performs all the cooking, and they have a housekeeper for household activities.         Hand Dominance   Dominant Hand: Right    Extremity/Trunk Assessment   Upper Extremity Assessment Upper Extremity Assessment: Generalized weakness    Lower Extremity Assessment Lower Extremity Assessment: Generalized weakness    Cervical / Trunk Assessment Cervical / Trunk Assessment: Kyphotic  Communication   Communication: No difficulties  Cognition Arousal/Alertness: Awake/alert Behavior During Therapy: WFL for tasks assessed/performed Overall Cognitive Status: Impaired/Different from baseline                 General Comments: pt is tangental at times.  Pt seems to be oriented to place and situation, however uncertain if he is oriented to time.    General Comments General comments (skin integrity, edema, etc.): B LE cellulitis with mepilex bandages on the L foot, and R foot with dressing undone.  Dressing taped together for PT tx and RN aware that dressing will need to be changed.      Exercises     Assessment/Plan    PT Assessment Patient needs continued PT services  PT Problem List Decreased strength;Decreased activity tolerance;Decreased balance;Decreased mobility;Decreased coordination;Decreased knowledge of use of DME;Decreased safety awareness;Decreased knowledge of precautions;Decreased skin integrity          PT Treatment Interventions DME instruction;Gait training;Functional mobility training;Therapeutic activities;Therapeutic exercise;Balance training;Patient/family education    PT Goals (Current goals can be found in the Care Plan section)  Acute Rehab PT  Goals Patient Stated Goal: Pt wants to go home and get stronger PT Goal Formulation: With patient Time For Goal Achievement: 07/08/16 Potential to Achieve Goals: Fair    Frequency Min 3X/week   Barriers to discharge   Although pt lives with wife, she is not able to physically assist him.     Co-evaluation               End of Session Equipment Utilized During Treatment: Gait belt;Oxygen Activity Tolerance: Patient limited by fatigue Patient left: in chair;with call bell/phone within reach Nurse Communication: Mobility status Lysbeth Galas, RN notified of pt's mobiltiy status, and mobility sheet left hanging in pt's room. )         Time: [id number] PT Time Calculation (min) (ACUTE ONLY): 43 min   Charges:         PT G Codes:        Beth Winda Summerall, PT, DPT [id number]

## 2016-07-01 NOTE — NC FL2 (Deleted)
[place] LEVEL OF CARE SCREENING TOOL     IDENTIFICATION  Patient Name: Erik Atkins Birthdate: January 07, 1941 Sex: male Admission Date (Current Location): 06/24/2016  [facility] [place] Number:  [employer] and Address:  [facility] [address] [place]      Provider Number: [id number]  Attending Physician Name and Address:  Sinda Du, MD  Relative Name and Phone Number:       Current Level of Care: Hospital Recommended Level of Care: [place] Prior Approval Number:    Date Approved/Denied:   PASRR Number: [id number]  Discharge Plan: SNF    Current Diagnoses: Patient Active Problem List   Diagnosis Date Noted  . Atrial fibrillation with rapid ventricular response ([place]) 06/29/2016  . Congestive heart failure ([place]) 06/29/2016  . Cellulitis of right leg 06/24/2016  . Sepsis ([place]) 06/24/2016  . HTN (hypertension) 06/24/2016  . Neurogenic bladder 06/24/2016  . Chronic indwelling Foley catheter 06/24/2016    Orientation RESPIRATION BLADDER Height & Weight     Self, Time, Situation, Place  Normal Indwelling catheter Weight: 128 lb 8.5 oz (58.3 kg) Height:  5\' 7"  (170.2 cm)  BEHAVIORAL SYMPTOMS/MOOD NEUROLOGICAL BOWEL NUTRITION STATUS  Other (Comment) (none)  (n/a) Incontinent Diet (Dysphagia 3- see d/c summary for updates)  AMBULATORY STATUS COMMUNICATION OF NEEDS Skin   Total Care Verbally Bruising, Other (Comment) (Cellulitis lower leg. Stage II to left leg with gauze. Non-pressure wound to right leg with gauze. )                       Personal Care Assistance Level of Assistance  Bathing, Feeding, Dressing Bathing Assistance: Maximum assistance Feeding assistance: Limited assistance Dressing Assistance: Maximum assistance     Functional Limitations Info  Sight, Hearing, Speech Sight Info: Adequate Hearing Info: Adequate Speech Info: Adequate    SPECIAL CARE FACTORS FREQUENCY   PT (By licensed PT)     PT Frequency: 5              Contractures      Additional Factors Info  Psychotropic Code Status Info: Full code Allergies Info: Penicillins, Darvon, Celexa (Citalopram Hydrobromide), Cortisone, Cymbalta (Duloxetine Hcl), Eggs or Egg-derived products, Guaifenesin Er, Keflex (Cephalexin), Motrin (Ibuprofen), Pumpkin Seed, Sweet Potato, Tea, Zoloft (Sertraline) Psychotropic Info: Xanax         Current Medications (07/01/2016):  This is the current hospital active medication list Current Facility-Administered Medications  Medication Dose Route Frequency Provider Last Rate Last Dose  . 0.9 %  sodium chloride infusion   Intravenous Continuous Sinda Du, MD 10 mL/hr at 07/01/16 [id number]    . acetaminophen (TYLENOL) tablet 650 mg  650 mg Oral Q6H PRN Erline Hau, MD       Or  . acetaminophen (TYLENOL) suppository 650 mg  650 mg Rectal Q6H PRN Erline Hau, MD      . albuterol (PROVENTIL) (2.5 MG/3ML) 0.083% nebulizer solution 2.5 mg  2.5 mg Nebulization TID Sinda Du, MD   2.5 mg at 07/01/16 1451  . ALPRAZolam Duanne Moron) tablet 0.25 mg  0.25 mg Oral 5 X Daily Estela Leonie Green, MD   0.25 mg at 07/01/16 1310  . apixaban (ELIQUIS) tablet 2.5 mg  2.5 mg Oral BID Sinda Du, MD      . clindamycin (CLEOCIN) IVPB 600 mg  600 mg Intravenous Q8H Erline Hau, MD   600 mg at 07/01/16  1310  . diltiazem (CARDIZEM) tablet 60 mg  60 mg Oral Q6H Sinda Du, MD   60 mg at 07/01/16 1310  . docusate sodium (COLACE) capsule 100 mg  100 mg Oral BID Erline Hau, MD   100 mg at 07/01/16 1033  . escitalopram (LEXAPRO) tablet 10 mg  10 mg Oral Daily Erline Hau, MD   10 mg at 07/01/16 1033  . furosemide (LASIX) injection 40 mg  40 mg Intravenous Q12H Sinda Du, MD   40 mg at 07/01/16 1030  . HYDROcodone-acetaminophen (NORCO/VICODIN) 5-325 MG per tablet 1 tablet  1 tablet Oral Q4H PRN Erline Hau, MD   1 tablet at 07/01/16 1310  . irbesartan (AVAPRO) tablet 150 mg  150 mg Oral Daily Sinda Du, MD   150 mg at 07/01/16 1033  . MEDLINE mouth rinse  15 mL Mouth Rinse BID Sinda Du, MD   15 mL at 07/01/16 1043  . mirabegron ER (MYRBETRIQ) tablet 50 mg  50 mg Oral Daily Sinda Du, MD   50 mg at 07/01/16 1033  . morphine 2 MG/ML injection 2 mg  2 mg Intravenous Q2H PRN Sinda Du, MD   2 mg at 06/30/16 1200  . ondansetron (ZOFRAN) tablet 4 mg  4 mg Oral Q6H PRN Erline Hau, MD       Or  . ondansetron [facility]) injection 4 mg  4 mg Intravenous Q6H PRN Erline Hau, MD      . phenol [facility]) mouth spray 1 spray  1 spray Mouth/Throat PRN Sinda Du, MD   1 spray at 06/29/16 1738  . polyethylene glycol (MIRALAX / GLYCOLAX) packet 17 g  17 g Oral Daily Erline Hau, MD   17 g at 06/26/16 0943  . potassium chloride SA (K-DUR,KLOR-CON) CR tablet 40 mEq  40 mEq Oral BID Sinda Du, MD   40 mEq at 07/01/16 1035  . senna-docusate (Senokot-S) tablet 1 tablet  1 tablet Oral QHS PRN Erline Hau, MD      . tiotropium [facility]) inhalation capsule 18 mcg  1 capsule Inhalation Daily Erline Hau, MD   18 mcg at 06/29/16 1435  . traZODone (DESYREL) tablet 50 mg  50 mg Oral QHS PRN Sinda Du, MD   50 mg at 06/28/16 2344     Discharge Medications: Please see discharge summary for a list of discharge medications.  Relevant Imaging Results:  Relevant Lab Results:   Additional Information SSN: [id number]  Salome Arnt, [place]

## 2016-07-01 NOTE — Progress Notes (Signed)
ANTICOAGULATION CONSULT NOTE -  Pharmacy Consult for eliquis Indication: atrial fibrillation  Allergies  Allergen Reactions  . Penicillins   . Darvon Swelling  . Celexa [Citalopram Hydrobromide] Nausea And Vomiting  . Cortisone Swelling  . Cymbalta [Duloxetine Hcl] Nausea Only  . Eggs Or Egg-Derived Products Nausea And Vomiting  . Guaifenesin Er   . Keflex [Cephalexin] Swelling  . Motrin [Ibuprofen]   . Pumpkin Seed Nausea And Vomiting    All pumpkin products  . Sweet Potato Nausea And Vomiting       . Tea Nausea And Vomiting  . Zoloft [Sertraline] Nausea Only    Patient Measurements: Height: 5\' 7"  (170.2 cm) Weight: 128 lb 8.5 oz (58.3 kg) IBW/kg (Calculated) : 66.1   Vital Signs: Temp: 97.5 F (36.4 C) (02/05 0759) Temp Source: Oral (02/05 0759) BP: 139/50 (02/05 1026) Pulse Rate: 92 (02/05 1026)  Labs:  Recent Labs  06/29/16 0651 06/30/16 0417 06/30/16 0729 07/01/16 0403  HGB 11.0* 11.5*  --  11.5*  HCT 32.2* 34.1*  --  34.4*  PLT 256 224  --  264  CREATININE 1.56*  --  1.76* 1.62*    Estimated Creatinine Clearance: 31.5 mL/min (by C-G formula based on SCr of 1.62 mg/dL (H)).   Medical History: Past Medical History:  Diagnosis Date  . Anxiety   . Charcot Marie Tooth muscular atrophy   . COPD (chronic obstructive pulmonary disease) ([place])   . Hemorrhoids   . History of pneumonia   . Hypertension   . Nerve disorder 1996   "Charot Marie"  . Neuropathy ([place])   . Tubular adenoma     Medications:  Prescriptions Prior to Admission  Medication Sig Dispense Refill Last Dose  . ALPRAZolam (XANAX) 0.25 MG tablet Take 0.25 mg by mouth 5 (five) times daily as needed for anxiety.    unknown  . CRANBERRY PO Take 1 tablet by mouth daily.   Past Week at Unknown time  . Docusate Calcium (STOOL SOFTENER PO) Take 1 tablet by mouth 2 (two) times daily.   Past Week at Unknown time  . escitalopram (LEXAPRO) 10 MG tablet Take 1 tablet by mouth daily.   Past Week  at Unknown time  . HYDROcodone-acetaminophen (NORCO/VICODIN) 5-325 MG per tablet Take 1 tablet by mouth every 4 (four) hours as needed.   Past Week at Unknown time  . Meth-Hyo-M Bl-Na Phos-Ph Sal (URIBEL) 118 MG CAPS Take 118 capsules by mouth as needed.   Past Week at Unknown time  . mirabegron ER (MYRBETRIQ) 50 MG TB24 tablet Take 50 mg by mouth as needed.   unknown  . NITRO-BID 2 % ointment APPLY A PEA SIZE AMOUNT TO THE ANAL CAVITY EVERY 6 HOURS AS NEEDED. 30 g 0 unknown  . NON FORMULARY Potassium   OTC    One daily   Past Week at Unknown time  . polyethylene glycol (MIRALAX / GLYCOLAX) packet Take 17 g by mouth daily as needed.   Past Month at Unknown time  . SPIRIVA HANDIHALER 18 MCG inhalation capsule Place 1 capsule into inhaler and inhale daily.   Past Week at Unknown time  . torsemide (DEMADEX) 100 MG tablet Take 0.5 tablets by mouth daily.   Past Week at Unknown time  . Tripelennamine HCl POWD Take 25 mg by mouth 2 (two) times daily.    Past Week at Unknown time  . valsartan (DIOVAN) 80 MG tablet Take 80 mg by mouth daily.     Past Week  at Unknown time  . Wheat Dextrin (BENEFIBER PO) Take 1 tablet by mouth daily.   Past Week at Unknown time    Assessment: 76 yo man to start eliquis for afib. CHA2DS2-VASc score 3  Baseline Hg 11.5, PTLC 224. Wt is 58kg and Scr is 1.62.   Goal of Therapy:  Therapeutic anticoagulation Monitor platelets by anticoagulation protocol: Yes   Plan:  Change Eliquis 2.5 mg po bid F/u CBC and monitor for bleeding complications  Thanks for allowing pharmacy to be a part of this patient's care.  Isac Sarna, BS [place], BCPS Clinical Pharmacist Pager [phone]  07/01/2016,10:38 AM

## 2016-07-01 NOTE — Care Management Note (Signed)
Case Management Note  Patient Details  Name: Erik Atkins MRN: [id number] Date of Birth: [birth year]  Subjective/Objective: Patient adm with cellulitis/COPD exacerbation, developed A fib with RVR, transferred to ICU. From home with wife, walks with a rollator walker. PT has recommended SNF vs HH. CM spoke with wife and patient regarding recommendations. Wife shares that she feels patient will need SNF, due to her inability to care for him regarding weakness and wound. Patient agreeable to talk CSW about option regarding SNF.                  Action/Plan:CSW notified. CM following for needs.   Expected Discharge Date:   07/02/2016               Expected Discharge Plan:  [place]  In-House Referral:  NA  Discharge planning Services  CM Consult  Post Acute Care Choice:  Home Health Choice offered to:  Spouse  DME Arranged:    DME Agency:     HH Arranged:  RN [place] Agency:     Status of Service:  In process, will continue to follow  If discussed at Long Length of Stay Meetings, dates discussed:    Additional Comments:  Laquan Ludden, [name], RN 07/01/2016, 2:07 PM

## 2016-07-01 NOTE — Progress Notes (Signed)
Speech Language Pathology Treatment: Dysphagia  Patient Details Name: Erik Atkins MRN: [id number] DOB: 07/31/1940 Today's Date: 07/01/2016 Time: [id number] SLP Time Calculation (min) (ACUTE ONLY): 33 min  Assessment / Plan / Recommendation Clinical Impression  Pt seen for ongoing diagnostic dysphagia intervention following BSE completed Friday. Pt moved to step down due to tachycardia. His wife was present for today's visit and SLP explained results of BSE on Friday. SLP verbalized that Pt is at risk for aspiration given history of COPD, generalized weakness, and current respiratory demands. Pt seen sitting up in chair today with lunch meal. Vocal quality is wet prior to po and he was encouraged to cough and clear (successfully). Pt able to self feed mech soft textures and thin liquids after assisted for feeding. Tongue appears coated and pt grabbed his chest during swallows at times complaining of "burning". Wife reports history of hiatal hernia. SLP asked RN to look at pt's tongue to determine if pt has thrush given complaints of burning (also complained of this on soft palate on Friday). Continue diet as ordered D3/thin with strict aspiration and reflux precautions. I again discussed possibly completing an objective assessment of swallow (MBSS) with Pt and spouse. I do not think that Pt would be open to altered consistencies (ie. Thickened liquids), however an objective assessment may help identify safe and efficient swallow precautions. Mrs. Gruenhagen doesn't think that he would want the test, but is willing to consider. SLP will follow in acute setting.    HPI HPI: Erik Atkins is a 76 y.o. 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 This states that last night at around midnight he complained of rectal pain, while straining on the toilet his Foley catheter was accidentally dislodged and she  called EMS for transport to the hospital to have the Foley replaced. Upon arrival he was noticed to have what appears to be a right leg cellulitis with signs of sepsis including hypotension, tachycardia, and a lactic acid of 2.54. CT scan of the abdomen was done given his rectal pain that was essentially negative for acute pathologies,urinalysis urinalysis shows many bacteria with too numerous to count WBCs but negative nitrates. Admission requested      SLP Plan  Continue with current plan of care     Recommendations  Diet recommendations: Dysphagia 3 (mechanical soft);Thin liquid Liquids provided via: Cup;Straw Medication Administration: Whole meds with puree Supervision: Patient able to self feed (recommend up in chair for all meals) Compensations: Slow rate;Small sips/bites;Multiple dry swallows after each bite/sip;Follow solids with liquid Postural Changes and/or Swallow Maneuvers: Out of bed for meals;Seated upright 90 degrees;Upright 30-60 min after meal                Oral Care Recommendations: Oral care BID;Staff/trained caregiver to provide oral care Follow up Recommendations: Skilled Nursing facility Plan: Continue with current plan of care       Thank you,  Genene Churn, [place]       [place] 07/01/2016, 2:29 PM

## 2016-07-01 NOTE — Progress Notes (Signed)
Subjective: He says he feels better. His heart rate is significantly better this morning. His blood pressure is holding okay. No other new complaints. No chest pain. His breathing is better. He is very weak.  Objective: Vital signs in last 24 hours: Temp:  [97.5 F (36.4 C)-100 F (37.8 C)] 97.5 F (36.4 C) (02/05 0759) Pulse Rate:  [46-128] 61 (02/05 0759) Resp:  [17-30] 20 (02/05 0759) BP: (94-163)/(24-83) 133/60 (02/05 0645) SpO2:  [85 %-100 %] 97 % (02/05 0759) Weight:  [58.3 kg (128 lb 8.5 oz)] 58.3 kg (128 lb 8.5 oz) (02/05 0500) Weight change: -1.8 kg (-3 lb 15.5 oz) Last BM Date: 06/29/16  Intake/Output from previous day: 02/04 0701 - 02/05 0700 In: 1154.3 [P.O.:600; I.V.:454.3; IV Piggyback:100] Out: 1150 [Urine:1150]  PHYSICAL EXAM General appearance: alert, cooperative and moderate distress Resp: He has bilateral rhonchi but his lungs are much clearer and he is moving air better Cardio: Heart is irregularly irregular but his heart rate is now in the 80s GI: soft, non-tender; bowel sounds normal; no masses,  no organomegaly Extremities: His area of cellulitis of his legs looks much better. He still has some open areas but the infection is improved Mucous membranes are moist.  Lab Results:  Results for orders placed or performed during the hospital encounter of 06/24/16 (from the past 48 hour(s))  MRSA PCR Screening     Status: None   Collection Time: 06/30/16  2:13 AM  Result Value Ref Range   MRSA by PCR NEGATIVE NEGATIVE    Comment:        The GeneXpert MRSA Assay (FDA approved for NASAL specimens only), is one component of a comprehensive MRSA colonization surveillance program. It is not intended to diagnose MRSA infection nor to guide or monitor treatment for MRSA infections.   CBC with Differential/Platelet     Status: Abnormal   Collection Time: 06/30/16  4:17 AM  Result Value Ref Range   WBC 13.9 (H) 4.0 - 10.5 K/uL   RBC 3.39 (L) 4.22 - 5.81  MIL/uL   Hemoglobin 11.5 (L) 13.0 - 17.0 g/dL   HCT 34.1 (L) 39.0 - 52.0 %   MCV 100.6 (H) 78.0 - 100.0 fL   MCH 33.9 26.0 - 34.0 pg   MCHC 33.7 30.0 - 36.0 g/dL   RDW 13.6 11.5 - 15.5 %   Platelets 224 150 - 400 K/uL    Comment: SPECIMEN CHECKED FOR CLOTS PLATELET COUNT CONFIRMED BY SMEAR    Neutrophils Relative % 76 %   Neutro Abs 10.6 (H) 1.7 - 7.7 K/uL   Lymphocytes Relative 12 %   Lymphs Abs 1.7 0.7 - 4.0 K/uL   Monocytes Relative 10 %   Monocytes Absolute 1.4 (H) 0.1 - 1.0 K/uL   Eosinophils Relative 1 %   Eosinophils Absolute 0.2 0.0 - 0.7 K/uL   Basophils Relative 0 %   Basophils Absolute 0.0 0.0 - 0.1 K/uL  Basic metabolic panel     Status: Abnormal   Collection Time: 06/30/16  7:29 AM  Result Value Ref Range   Sodium 137 135 - 145 mmol/L   Potassium 3.8 3.5 - 5.1 mmol/L   Chloride 106 101 - 111 mmol/L   CO2 22 22 - 32 mmol/L   Glucose, Bld 97 65 - 99 mg/dL   BUN 37 (H) 6 - 20 mg/dL   Creatinine, Ser 1.76 (H) 0.61 - 1.24 mg/dL   Calcium 7.5 (L) 8.9 - 10.3 mg/dL   GFR calc non  Af Amer 36 (L) >60 mL/min   GFR calc Af Amer 41 (L) >60 mL/min    Comment: (NOTE) The eGFR has been calculated using the CKD EPI equation. This calculation has not been validated in all clinical situations. eGFR's persistently <60 mL/min signify possible Chronic Kidney Disease.    Anion gap 9 5 - 15  Basic metabolic panel     Status: Abnormal   Collection Time: 07/01/16  4:03 AM  Result Value Ref Range   Sodium 135 135 - 145 mmol/L   Potassium 3.6 3.5 - 5.1 mmol/L   Chloride 103 101 - 111 mmol/L   CO2 22 22 - 32 mmol/L   Glucose, Bld 123 (H) 65 - 99 mg/dL   BUN 34 (H) 6 - 20 mg/dL   Creatinine, Ser 1.62 (H) 0.61 - 1.24 mg/dL   Calcium 7.4 (L) 8.9 - 10.3 mg/dL   GFR calc non Af Amer 39 (L) >60 mL/min   GFR calc Af Amer 46 (L) >60 mL/min    Comment: (NOTE) The eGFR has been calculated using the CKD EPI equation. This calculation has not been validated in all clinical  situations. eGFR's persistently <60 mL/min signify possible Chronic Kidney Disease.    Anion gap 10 5 - 15  CBC with Differential/Platelet     Status: Abnormal   Collection Time: 07/01/16  4:03 AM  Result Value Ref Range   WBC 12.9 (H) 4.0 - 10.5 K/uL   RBC 3.43 (L) 4.22 - 5.81 MIL/uL   Hemoglobin 11.5 (L) 13.0 - 17.0 g/dL   HCT 34.4 (L) 39.0 - 52.0 %   MCV 100.3 (H) 78.0 - 100.0 fL   MCH 33.5 26.0 - 34.0 pg   MCHC 33.4 30.0 - 36.0 g/dL   RDW 13.6 11.5 - 15.5 %   Platelets 264 150 - 400 K/uL   Neutrophils Relative % 78 %   Neutro Abs 10.1 (H) 1.7 - 7.7 K/uL   Lymphocytes Relative 12 %   Lymphs Abs 1.6 0.7 - 4.0 K/uL   Monocytes Relative 9 %   Monocytes Absolute 1.2 (H) 0.1 - 1.0 K/uL   Eosinophils Relative 1 %   Eosinophils Absolute 0.1 0.0 - 0.7 K/uL   Basophils Relative 0 %   Basophils Absolute 0.0 0.0 - 0.1 K/uL    ABGS No results for input(s): PHART, PO2ART, TCO2, HCO3 in the last 72 hours.  Invalid input(s): PCO2 CULTURES Recent Results (from the past 240 hour(s))  Urine culture     Status: Abnormal   Collection Time: 06/24/16  6:24 AM  Result Value Ref Range Status   Specimen Description URINE, CLEAN CATCH  Final   Special Requests NONE  Final   Culture (A)  Final    <10,000 COLONIES/mL INSIGNIFICANT GROWTH Performed at [facility], [address], [place], [place] [address]    Report Status 06/25/2016 FINAL  Final  Blood culture (routine x 2)     Status: None   Collection Time: 06/24/16  8:13 AM  Result Value Ref Range Status   Specimen Description BLOOD RIGHT HAND  Final   Special Requests BOTTLES DRAWN AEROBIC AND ANAEROBIC 6CC  Final   Culture NO GROWTH 5 DAYS  Final   Report Status 06/29/2016 FINAL  Final  Blood culture (routine x 2)     Status: None   Collection Time: 06/24/16  8:22 AM  Result Value Ref Range Status   Specimen Description BLOOD LEFT ARM  Final   Special  Requests BOTTLES DRAWN AEROBIC AND ANAEROBIC 6CC  Final   Culture NO  GROWTH 5 DAYS  Final   Report Status 06/29/2016 FINAL  Final  MRSA PCR Screening     Status: None   Collection Time: 06/30/16  2:13 AM  Result Value Ref Range Status   MRSA by PCR NEGATIVE NEGATIVE Final    Comment:        The GeneXpert MRSA Assay (FDA approved for NASAL specimens only), is one component of a comprehensive MRSA colonization surveillance program. It is not intended to diagnose MRSA infection nor to guide or monitor treatment for MRSA infections.    Studies/Results: No results found.  Medications:  Prior to Admission:  Prescriptions Prior to Admission  Medication Sig Dispense Refill Last Dose  . ALPRAZolam (XANAX) 0.25 MG tablet Take 0.25 mg by mouth 5 (five) times daily as needed for anxiety.    unknown  . CRANBERRY PO Take 1 tablet by mouth daily.   Past Week at Unknown time  . Docusate Calcium (STOOL SOFTENER PO) Take 1 tablet by mouth 2 (two) times daily.   Past Week at Unknown time  . escitalopram (LEXAPRO) 10 MG tablet Take 1 tablet by mouth daily.   Past Week at Unknown time  . HYDROcodone-acetaminophen (NORCO/VICODIN) 5-325 MG per tablet Take 1 tablet by mouth every 4 (four) hours as needed.   Past Week at Unknown time  . Meth-Hyo-M Bl-Na Phos-Ph Sal (URIBEL) 118 MG CAPS Take 118 capsules by mouth as needed.   Past Week at Unknown time  . mirabegron ER (MYRBETRIQ) 50 MG TB24 tablet Take 50 mg by mouth as needed.   unknown  . NITRO-BID 2 % ointment APPLY A PEA SIZE AMOUNT TO THE ANAL CAVITY EVERY 6 HOURS AS NEEDED. 30 g 0 unknown  . NON FORMULARY Potassium   OTC    One daily   Past Week at Unknown time  . polyethylene glycol (MIRALAX / GLYCOLAX) packet Take 17 g by mouth daily as needed.   Past Month at Unknown time  . SPIRIVA HANDIHALER 18 MCG inhalation capsule Place 1 capsule into inhaler and inhale daily.   Past Week at Unknown time  . torsemide (DEMADEX) 100 MG tablet Take 0.5 tablets by mouth daily.   Past Week at Unknown time  . Tripelennamine HCl  POWD Take 25 mg by mouth 2 (two) times daily.    Past Week at Unknown time  . valsartan (DIOVAN) 80 MG tablet Take 80 mg by mouth daily.     Past Week at Unknown time  . Wheat Dextrin (BENEFIBER PO) Take 1 tablet by mouth daily.   Past Week at Unknown time   Scheduled: . albuterol  2.5 mg Nebulization TID  . ALPRAZolam  0.25 mg Oral 5 X Daily  . apixaban  5 mg Oral BID  . clindamycin (CLEOCIN) IV  600 mg Intravenous Q8H  . docusate sodium  100 mg Oral BID  . escitalopram  10 mg Oral Daily  . furosemide  40 mg Intravenous Q12H  . irbesartan  150 mg Oral Daily  . mouth rinse  15 mL Mouth Rinse BID  . mirabegron ER  50 mg Oral Daily  . polyethylene glycol  17 g Oral Daily  . potassium chloride  40 mEq Oral BID  . tiotropium  1 capsule Inhalation Daily   Continuous: . sodium chloride 10 mL/hr at 07/01/16 0610  . diltiazem (CARDIZEM) infusion 10 mg/hr (07/01/16 0610)   BOL:YUZVRVOLYERKT **OR** acetaminophen,  HYDROcodone-acetaminophen, morphine injection, ondansetron **OR** ondansetron (ZOFRAN) IV, phenol, senna-docusate, traZODone  Assesment: He was admitted with cellulitis of his right leg and sepsis from that. He has developed atrial fib with RVR but that's improved. He had heart failure but that's improved. Some of his heart failure is likely from volume resuscitation for sepsis. He had COPD exacerbation and that's better as well. 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 At baseline he has neurogenic bladder and chronic indwelling Foley catheter. He has a number of areas of skin breakdown Principal Problem:   Cellulitis of right leg Active Problems:   Sepsis ([place])   HTN (hypertension)   Neurogenic bladder   Chronic indwelling Foley catheter   Atrial fibrillation with rapid ventricular response (HCC)   Congestive heart failure ([place])    Plan: He is improved. I think he needs to stay in stepdown 1 more day. He is still on IV diltiazem and I'm going to  switch him to by mouth. Request PT consultation because I'm concerned that he is not going to be able to go directly home but may require a rehabilitation stay first. Discussed with patient and his wife at bedside    LOS: 7 days   Claretta Kendra L 07/01/2016, 8:09 AM

## 2016-07-02 DIAGNOSIS — L8990 Pressure ulcer of unspecified site, unspecified stage: Secondary | ICD-10-CM | POA: Insufficient documentation

## 2016-07-02 LAB — CBC WITH DIFFERENTIAL/PLATELET
BASOS PCT: 0 %
Basophils Absolute: 0 10*3/uL (ref 0.0–0.1)
EOS PCT: 1 %
Eosinophils Absolute: 0.1 10*3/uL (ref 0.0–0.7)
HCT: 35.9 % — ABNORMAL LOW (ref 39.0–52.0)
Hemoglobin: 11.8 g/dL — ABNORMAL LOW (ref 13.0–17.0)
LYMPHS ABS: 1.9 10*3/uL (ref 0.7–4.0)
Lymphocytes Relative: 14 %
MCH: 33.2 pg (ref 26.0–34.0)
MCHC: 32.9 g/dL (ref 30.0–36.0)
MCV: 101.1 fL — AB (ref 78.0–100.0)
MONO ABS: 1.1 10*3/uL — AB (ref 0.1–1.0)
MONOS PCT: 8 %
NEUTROS ABS: 10.2 10*3/uL — AB (ref 1.7–7.7)
Neutrophils Relative %: 77 %
Platelets: 244 10*3/uL (ref 150–400)
RBC: 3.55 MIL/uL — AB (ref 4.22–5.81)
RDW: 13.7 % (ref 11.5–15.5)
WBC: 13.3 10*3/uL — AB (ref 4.0–10.5)

## 2016-07-02 LAB — BASIC METABOLIC PANEL
Anion gap: 7 (ref 5–15)
BUN: 34 mg/dL — AB (ref 6–20)
CHLORIDE: 107 mmol/L (ref 101–111)
CO2: 23 mmol/L (ref 22–32)
CREATININE: 1.6 mg/dL — AB (ref 0.61–1.24)
Calcium: 7.6 mg/dL — ABNORMAL LOW (ref 8.9–10.3)
GFR calc Af Amer: 46 mL/min — ABNORMAL LOW (ref >60)
GFR calc non Af Amer: 40 mL/min — ABNORMAL LOW (ref >60)
Glucose, Bld: 102 mg/dL — ABNORMAL HIGH (ref 65–99)
POTASSIUM: 4.7 mmol/L (ref 3.5–5.1)
Sodium: 137 mmol/L (ref 135–145)

## 2016-07-02 MED ORDER — MAGIC MOUTHWASH
10.0000 mL | Freq: Four times a day (QID) | ORAL | Status: DC
  Administered 2016-07-02 – 2016-07-03 (×6): 10 mL via ORAL
  Filled 2016-07-02 (×6): qty 10

## 2016-07-02 MED ORDER — PHENOL 1.4 % MT LIQD
1.0000 | OROMUCOSAL | Status: DC | PRN
  Administered 2016-07-05: 1 via OROMUCOSAL
  Filled 2016-07-02: qty 177

## 2016-07-02 NOTE — NC FL2 (Signed)
[place] LEVEL OF CARE SCREENING TOOL     IDENTIFICATION  Patient Name: Erik Atkins Birthdate: 1941-01-10 Sex: male Admission Date (Current Location): 06/24/2016  [facility] and [place] Number:  [employer] and Address:  [place] [address], [place]      Provider Number: [id number]  Attending Physician Name and Address:  Sinda Du, MD  Relative Name and Phone Number:       Current Level of Care: Hospital Recommended Level of Care: [place] Prior Approval Number:    Date Approved/Denied:   PASRR Number: [id number]  Discharge Plan: SNF    Current Diagnoses: Patient Active Problem List   Diagnosis Date Noted  . Pressure injury of skin 07/02/2016  . Atrial fibrillation with rapid ventricular response ([place]) 06/29/2016  . Congestive heart failure ([place]) 06/29/2016  . Cellulitis of right leg 06/24/2016  . Sepsis ([place]) 06/24/2016  . HTN (hypertension) 06/24/2016  . Neurogenic bladder 06/24/2016  . Chronic indwelling Foley catheter 06/24/2016    Orientation RESPIRATION BLADDER Height & Weight     Self, Time, Situation, Place  Normal Indwelling catheter Weight: 132 lb 7.9 oz (60.1 kg) Height:  5\' 7"  (170.2 cm)  BEHAVIORAL SYMPTOMS/MOOD NEUROLOGICAL BOWEL NUTRITION STATUS  Other (Comment) (none)  (n/a) Incontinent Diet (Dysphagia 3- see d/c summary for updates)  AMBULATORY STATUS COMMUNICATION OF NEEDS Skin   Total Care Verbally Bruising, Other (Comment) (Cellulitis lower leg. Stage II to left leg with gauze. Non-pressure wound to right leg with gauze. )                       Personal Care Assistance Level of Assistance  Bathing, Feeding, Dressing Bathing Assistance: Maximum assistance Feeding assistance: Limited assistance Dressing Assistance: Maximum assistance     Functional Limitations Info  Sight, Hearing, Speech Sight Info: Adequate Hearing Info: Adequate Speech Info: Adequate     SPECIAL CARE FACTORS FREQUENCY  PT (By licensed PT)     PT Frequency: 5              Contractures      Additional Factors Info  Psychotropic Code Status Info: Full code Allergies Info: Penicillins, Darvon, Celexa (Citalopram Hydrobromide), Cortisone, Cymbalta (Duloxetine Hcl), Eggs or Egg-derived products, Guaifenesin Er, Keflex (Cephalexin), Motrin (Ibuprofen), Pumpkin Seed, Sweet Potato, Tea, Zoloft (Sertraline) Psychotropic Info: Xanax         Current Medications (07/02/2016):  This is the current hospital active medication list Current Facility-Administered Medications  Medication Dose Route Frequency Provider Last Rate Last Dose  . 0.9 %  sodium chloride infusion   Intravenous Continuous Sinda Du, MD 10 mL/hr at 07/01/16 [id number]    . acetaminophen (TYLENOL) tablet 650 mg  650 mg Oral Q6H PRN Erline Hau, MD       Or  . acetaminophen (TYLENOL) suppository 650 mg  650 mg Rectal Q6H PRN Erline Hau, MD      . albuterol (PROVENTIL) (2.5 MG/3ML) 0.083% nebulizer solution 2.5 mg  2.5 mg Nebulization TID Sinda Du, MD   2.5 mg at 07/02/16 0830  . ALPRAZolam Duanne Moron) tablet 0.25 mg  0.25 mg Oral 5 X Daily Estela Leonie Green, MD   0.25 mg at 07/02/16 0118  . apixaban (ELIQUIS) tablet 2.5 mg  2.5 mg Oral BID Sinda Du, MD   2.5 mg at 07/02/16 0119  . clindamycin (CLEOCIN) IVPB 600 mg  600 mg Intravenous Q8H Estela  Leonie Green, MD   600 mg at 07/02/16 [phone]  . diltiazem (CARDIZEM) tablet 60 mg  60 mg Oral Q6H Sinda Du, MD   60 mg at 07/02/16 [phone]  . docusate sodium (COLACE) capsule 100 mg  100 mg Oral BID Erline Hau, MD   100 mg at 07/02/16 0119  . escitalopram (LEXAPRO) tablet 10 mg  10 mg Oral Daily Erline Hau, MD   10 mg at 07/01/16 1033  . furosemide (LASIX) injection 40 mg  40 mg Intravenous Q12H Sinda Du, MD   40 mg at 07/02/16 0122  . HYDROcodone-acetaminophen (NORCO/VICODIN) 5-325  MG per tablet 1 tablet  1 tablet Oral Q4H PRN Erline Hau, MD   1 tablet at 07/01/16 1310  . irbesartan (AVAPRO) tablet 150 mg  150 mg Oral Daily Sinda Du, MD   150 mg at 07/01/16 1033  . MEDLINE mouth rinse  15 mL Mouth Rinse BID Sinda Du, MD   15 mL at 07/02/16 0125  . mirabegron ER (MYRBETRIQ) tablet 50 mg  50 mg Oral Daily Sinda Du, MD   50 mg at 07/01/16 1033  . morphine 2 MG/ML injection 2 mg  2 mg Intravenous Q2H PRN Sinda Du, MD   2 mg at 07/01/16 1604  . ondansetron (ZOFRAN) tablet 4 mg  4 mg Oral Q6H PRN Erline Hau, MD       Or  . ondansetron [facility]) injection 4 mg  4 mg Intravenous Q6H PRN Erline Hau, MD      . phenol [facility]) mouth spray 1 spray  1 spray Mouth/Throat PRN Sinda Du, MD   1 spray at 06/29/16 1738  . polyethylene glycol (MIRALAX / GLYCOLAX) packet 17 g  17 g Oral Daily Erline Hau, MD   17 g at 06/26/16 0943  . potassium chloride SA (K-DUR,KLOR-CON) CR tablet 40 mEq  40 mEq Oral BID Sinda Du, MD   40 mEq at 07/02/16 0118  . senna-docusate (Senokot-S) tablet 1 tablet  1 tablet Oral QHS PRN Erline Hau, MD      . tiotropium [facility]) inhalation capsule 18 mcg  1 capsule Inhalation Daily Erline Hau, MD   18 mcg at 06/29/16 1435  . traZODone (DESYREL) tablet 50 mg  50 mg Oral QHS PRN Sinda Du, MD   50 mg at 06/28/16 2344     Discharge Medications: Please see discharge summary for a list of discharge medications.  Relevant Imaging Results:  Relevant Lab Results:   Additional Information SSN: [id number]. Per wife, pt has to wear shoes when up at all times.   Benay Pike Hauser, [place]

## 2016-07-02 NOTE — Clinical Social Work Placement (Signed)
   CLINICAL SOCIAL WORK PLACEMENT  NOTE  Date:  07/02/2016  Patient Details  Name: Erik Atkins MRN: [id number] Date of Birth: June 09, 1940  Clinical Social Work is seeking post-discharge placement for this patient at the [place] level of care (*CSW will initial, date and re-position this form in  chart as items are completed):  Yes   Patient/family provided with [facility] Work Department's list of facilities offering this level of care within the geographic area requested by the patient (or if unable, by the patient's family).  Yes   Patient/family informed of their freedom to choose among providers that offer the needed level of care, that participate in Medicare, Medicaid or managed care program needed by the patient, have an available bed and are willing to accept the patient.  Yes   Patient/family informed of [facility]'s ownership interest in [facility] and [facility], as well as of the fact that they are under no obligation to receive care at these facilities.  PASRR submitted to EDS on       PASRR number received on       Existing PASRR number confirmed on 07/02/16     FL2 transmitted to all facilities in geographic area requested by pt/family on 07/02/16     FL2 transmitted to all facilities within larger geographic area on       Patient informed that his/her managed care company has contracts with or will negotiate with certain facilities, including the following:            Patient/family informed of bed offers received.  Patient chooses bed at       Physician recommends and patient chooses bed at      Patient to be transferred to   on  .  Patient to be transferred to facility by       Patient family notified on   of transfer.  Name of family member notified:        PHYSICIAN       Additional Comment:    _______________________________________________ Salome Arnt, [place] 07/02/2016, 9:07  AM [phone]

## 2016-07-02 NOTE — Clinical Social Work Note (Signed)
Clinical Social Work Assessment  Patient Details  Name: Erik Atkins MRN: [id number] Date of Birth: Aug 01, 1940  Date of referral:  07/02/16               Reason for consult:  Discharge Planning                Permission sought to share information with:  Family Supports Permission granted to share information::  Yes, Verbal Permission Granted  Name::     Writer::     Relationship::  wife  Contact Information:     Housing/Transportation Living arrangements for the past 2 months:  Single Family Home Source of Information:  Patient, Spouse Patient Interpreter Needed:  None Criminal Activity/Legal Involvement Pertinent to Current Situation/Hospitalization:  No - Comment as needed Significant Relationships:  Spouse Lives with:  Spouse Do you feel safe going back to the place where you live?  Yes Need for family participation in patient care:  Yes (Comment)  Care giving concerns:  Pt's wife does not feel she can manage pt at home in current condition.    Social Worker assessment / plan:  CSW met with pt at bedside in ICU. Pt reports he lives with his wife and they generally manage fine. Pt states he dresses himself and doesn't feel that his wife has to help with much. Pt ambulates with a walker. CSW discussed PT evaluation and recommendation for SNF. Pt states, "I would love to get stronger." However, he is not interested in staying at SNF overnight. He has been to [facility] in the past and will not consider returning there. Pt did agree for CSW to speak to his wife about placement, but said that he wanted to speak to her as well before he made a final decision. CSW spoke with pt's wife who is sick at home and she confirms that pt will need placement at d/c.   Employment status:  Retired Nurse, adult PT Recommendations:  [place] / Referral to community resources:  [place]  Patient/Family's Response to care:   Pt reluctant to agree to SNF because he does not want to spend the night. He eventually agreed to initiate bed search and will consider further.   Patient/Family's Understanding of and Emotional Response to Diagnosis, Current Treatment, and Prognosis:  Pt is aware of recommendations, but feels that his wife would not need to help him much at home. CSW shared that he will require quite a bit of assistance at least initially.   Emotional Assessment Appearance:  Appears stated age Attitude/Demeanor/Rapport:  Other (Pleasant) Affect (typically observed):  Appropriate Orientation:  Oriented to Place, Oriented to  Time, Oriented to Situation, Oriented to Self Alcohol / Substance use:  Not Applicable Psych involvement (Current and /or in the community):  No (Comment)  Discharge Needs  Concerns to be addressed:  Discharge Planning Concerns Readmission within the last 30 days:  No Current discharge risk:  Physical Impairment Barriers to Discharge:  Continued Medical Work up   Salome Arnt, [place] 07/02/2016, 9:08 AM [phone]

## 2016-07-02 NOTE — Progress Notes (Signed)
Subjective: He is slowly recovering. He is very weak. He had PT evaluation and it is recommended that he go to skilled care facility. I discussed that with him this morning and he is resistant to the idea of doing that. He had a lot of difficulty with weakness getting back to bed from a chair yesterday. He is still coughing. No chest pain.  Objective: Vital signs in last 24 hours: Temp:  [97.5 F (36.4 C)-98.4 F (36.9 C)] 98.4 F (36.9 C) (02/05 2000) Pulse Rate:  [38-92] 63 (02/05 1800) Resp:  [16-25] 18 (02/05 1800) BP: (92-154)/(32-60) 154/54 (02/06 0637) SpO2:  [92 %-100 %] 97 % (02/05 2045) Weight change:  Last BM Date: 06/30/16  Intake/Output from previous day: 02/05 0701 - 02/06 0700 In: 120 [P.O.:120] Out: 500 [Urine:500]  PHYSICAL EXAM General appearance: alert, mild distress and Week. Speech is difficult to understand Resp: rhonchi bilaterally Cardio: irregularly irregular rhythm GI: soft, non-tender; bowel sounds normal; no masses,  no organomegaly Extremities: Skin looks better Mucous membranes are dry. This may be part of the problem with his speech.  Lab Results:  Results for orders placed or performed during the hospital encounter of 06/24/16 (from the past 48 hour(s))  Basic metabolic panel     Status: Abnormal   Collection Time: 06/30/16  7:29 AM  Result Value Ref Range   Sodium 137 135 - 145 mmol/L   Potassium 3.8 3.5 - 5.1 mmol/L   Chloride 106 101 - 111 mmol/L   CO2 22 22 - 32 mmol/L   Glucose, Bld 97 65 - 99 mg/dL   BUN 37 (H) 6 - 20 mg/dL   Creatinine, Ser 1.76 (H) 0.61 - 1.24 mg/dL   Calcium 7.5 (L) 8.9 - 10.3 mg/dL   GFR calc non Af Amer 36 (L) >60 mL/min   GFR calc Af Amer 41 (L) >60 mL/min    Comment: (NOTE) The eGFR has been calculated using the CKD EPI equation. This calculation has not been validated in all clinical situations. eGFR's persistently <60 mL/min signify possible Chronic Kidney Disease.    Anion gap 9 5 - 15  Basic  metabolic panel     Status: Abnormal   Collection Time: 07/01/16  4:03 AM  Result Value Ref Range   Sodium 135 135 - 145 mmol/L   Potassium 3.6 3.5 - 5.1 mmol/L   Chloride 103 101 - 111 mmol/L   CO2 22 22 - 32 mmol/L   Glucose, Bld 123 (H) 65 - 99 mg/dL   BUN 34 (H) 6 - 20 mg/dL   Creatinine, Ser 1.62 (H) 0.61 - 1.24 mg/dL   Calcium 7.4 (L) 8.9 - 10.3 mg/dL   GFR calc non Af Amer 39 (L) >60 mL/min   GFR calc Af Amer 46 (L) >60 mL/min    Comment: (NOTE) The eGFR has been calculated using the CKD EPI equation. This calculation has not been validated in all clinical situations. eGFR's persistently <60 mL/min signify possible Chronic Kidney Disease.    Anion gap 10 5 - 15  CBC with Differential/Platelet     Status: Abnormal   Collection Time: 07/01/16  4:03 AM  Result Value Ref Range   WBC 12.9 (H) 4.0 - 10.5 K/uL   RBC 3.43 (L) 4.22 - 5.81 MIL/uL   Hemoglobin 11.5 (L) 13.0 - 17.0 g/dL   HCT 34.4 (L) 39.0 - 52.0 %   MCV 100.3 (H) 78.0 - 100.0 fL   MCH 33.5 26.0 - 34.0 pg  MCHC 33.4 30.0 - 36.0 g/dL   RDW 13.6 11.5 - 15.5 %   Platelets 264 150 - 400 K/uL   Neutrophils Relative % 78 %   Neutro Abs 10.1 (H) 1.7 - 7.7 K/uL   Lymphocytes Relative 12 %   Lymphs Abs 1.6 0.7 - 4.0 K/uL   Monocytes Relative 9 %   Monocytes Absolute 1.2 (H) 0.1 - 1.0 K/uL   Eosinophils Relative 1 %   Eosinophils Absolute 0.1 0.0 - 0.7 K/uL   Basophils Relative 0 %   Basophils Absolute 0.0 0.0 - 0.1 K/uL  Basic metabolic panel     Status: Abnormal   Collection Time: 07/02/16  4:09 AM  Result Value Ref Range   Sodium 137 135 - 145 mmol/L   Potassium 4.7 3.5 - 5.1 mmol/L    Comment: DELTA CHECK NOTED   Chloride 107 101 - 111 mmol/L   CO2 23 22 - 32 mmol/L   Glucose, Bld 102 (H) 65 - 99 mg/dL   BUN 34 (H) 6 - 20 mg/dL   Creatinine, Ser 1.60 (H) 0.61 - 1.24 mg/dL   Calcium 7.6 (L) 8.9 - 10.3 mg/dL   GFR calc non Af Amer 40 (L) >60 mL/min   GFR calc Af Amer 46 (L) >60 mL/min    Comment:  (NOTE) The eGFR has been calculated using the CKD EPI equation. This calculation has not been validated in all clinical situations. eGFR's persistently <60 mL/min signify possible Chronic Kidney Disease.    Anion gap 7 5 - 15  CBC with Differential/Platelet     Status: Abnormal (Preliminary result)   Collection Time: 07/02/16  4:09 AM  Result Value Ref Range   WBC 13.3 (H) 4.0 - 10.5 K/uL   RBC 3.55 (L) 4.22 - 5.81 MIL/uL   Hemoglobin 11.8 (L) 13.0 - 17.0 g/dL   HCT 35.9 (L) 39.0 - 52.0 %   MCV 101.1 (H) 78.0 - 100.0 fL   MCH 33.2 26.0 - 34.0 pg   MCHC 32.9 30.0 - 36.0 g/dL   RDW 13.7 11.5 - 15.5 %   Platelets 244 150 - 400 K/uL   Neutrophils Relative % PENDING %   Neutro Abs PENDING 1.7 - 7.7 K/uL   Band Neutrophils PENDING %   Lymphocytes Relative PENDING %   Lymphs Abs PENDING 0.7 - 4.0 K/uL   Monocytes Relative PENDING %   Monocytes Absolute PENDING 0.1 - 1.0 K/uL   Eosinophils Relative PENDING %   Eosinophils Absolute PENDING 0.0 - 0.7 K/uL   Basophils Relative PENDING %   Basophils Absolute PENDING 0.0 - 0.1 K/uL   WBC Morphology PENDING    RBC Morphology PENDING    Smear Review PENDING    nRBC PENDING 0 /100 WBC   Metamyelocytes Relative PENDING %   Myelocytes PENDING %   Promyelocytes Absolute PENDING %   Blasts PENDING %    ABGS No results for input(s): PHART, PO2ART, TCO2, HCO3 in the last 72 hours.  Invalid input(s): PCO2 CULTURES Recent Results (from the past 240 hour(s))  Urine culture     Status: Abnormal   Collection Time: 06/24/16  6:24 AM  Result Value Ref Range Status   Specimen Description URINE, CLEAN CATCH  Final   Special Requests NONE  Final   Culture (A)  Final    <10,000 COLONIES/mL INSIGNIFICANT GROWTH Performed at [facility], [address] [place], [place] [address]    Report Status 06/25/2016 FINAL  Final  Blood culture (routine x 2)     Status: None   Collection Time: 06/24/16  8:13 AM  Result Value Ref Range Status    Specimen Description BLOOD RIGHT HAND  Final   Special Requests BOTTLES DRAWN AEROBIC AND ANAEROBIC 6CC  Final   Culture NO GROWTH 5 DAYS  Final   Report Status 06/29/2016 FINAL  Final  Blood culture (routine x 2)     Status: None   Collection Time: 06/24/16  8:22 AM  Result Value Ref Range Status   Specimen Description BLOOD LEFT ARM  Final   Special Requests BOTTLES DRAWN AEROBIC AND ANAEROBIC 6CC  Final   Culture NO GROWTH 5 DAYS  Final   Report Status 06/29/2016 FINAL  Final  MRSA PCR Screening     Status: None   Collection Time: 06/30/16  2:13 AM  Result Value Ref Range Status   MRSA by PCR NEGATIVE NEGATIVE Final    Comment:        The GeneXpert MRSA Assay (FDA approved for NASAL specimens only), is one component of a comprehensive MRSA colonization surveillance program. It is not intended to diagnose MRSA infection nor to guide or monitor treatment for MRSA infections.    Studies/Results: No results found.  Medications:  Prior to Admission:  Prescriptions Prior to Admission  Medication Sig Dispense Refill Last Dose  . ALPRAZolam (XANAX) 0.25 MG tablet Take 0.25 mg by mouth 5 (five) times daily as needed for anxiety.    unknown  . CRANBERRY PO Take 1 tablet by mouth daily.   Past Week at Unknown time  . Docusate Calcium (STOOL SOFTENER PO) Take 1 tablet by mouth 2 (two) times daily.   Past Week at Unknown time  . escitalopram (LEXAPRO) 10 MG tablet Take 1 tablet by mouth daily.   Past Week at Unknown time  . HYDROcodone-acetaminophen (NORCO/VICODIN) 5-325 MG per tablet Take 1 tablet by mouth every 4 (four) hours as needed.   Past Week at Unknown time  . Meth-Hyo-M Bl-Na Phos-Ph Sal (URIBEL) 118 MG CAPS Take 118 capsules by mouth as needed.   Past Week at Unknown time  . mirabegron ER (MYRBETRIQ) 50 MG TB24 tablet Take 50 mg by mouth as needed.   unknown  . NITRO-BID 2 % ointment APPLY A PEA SIZE AMOUNT TO THE ANAL CAVITY EVERY 6 HOURS AS NEEDED. 30 g 0 unknown  . NON  FORMULARY Potassium   OTC    One daily   Past Week at Unknown time  . polyethylene glycol (MIRALAX / GLYCOLAX) packet Take 17 g by mouth daily as needed.   Past Month at Unknown time  . SPIRIVA HANDIHALER 18 MCG inhalation capsule Place 1 capsule into inhaler and inhale daily.   Past Week at Unknown time  . torsemide (DEMADEX) 100 MG tablet Take 0.5 tablets by mouth daily.   Past Week at Unknown time  . Tripelennamine HCl POWD Take 25 mg by mouth 2 (two) times daily.    Past Week at Unknown time  . valsartan (DIOVAN) 80 MG tablet Take 80 mg by mouth daily.     Past Week at Unknown time  . Wheat Dextrin (BENEFIBER PO) Take 1 tablet by mouth daily.   Past Week at Unknown time   Scheduled: . albuterol  2.5 mg Nebulization TID  . ALPRAZolam  0.25 mg Oral 5 X Daily  . apixaban  2.5 mg Oral BID  . clindamycin (CLEOCIN) IV  600 mg Intravenous Q8H  .  diltiazem  60 mg Oral Q6H  . docusate sodium  100 mg Oral BID  . escitalopram  10 mg Oral Daily  . furosemide  40 mg Intravenous Q12H  . irbesartan  150 mg Oral Daily  . mouth rinse  15 mL Mouth Rinse BID  . mirabegron ER  50 mg Oral Daily  . polyethylene glycol  17 g Oral Daily  . potassium chloride  40 mEq Oral BID  . tiotropium  1 capsule Inhalation Daily   Continuous: . sodium chloride 10 mL/hr at 07/01/16 4709   GGE:[id number] **OR** acetaminophen, HYDROcodone-acetaminophen, morphine injection, ondansetron **OR** ondansetron (ZOFRAN) IV, phenol, senna-docusate, traZODone  Assesment: He was admitted with cellulitis of the right leg. He was septic from that. Since admission he has developed atrial fibrillation rapid ventricular response and congestive heart failure. I think his atrial fib is probably related to COPD exacerbation. He is well controlled now. 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 He has neurogenic bladder but did not have urinary tract infection. He still has significant chest congestion. Heart  rate is much improved. He is very deconditioned and he is weak at baseline because of his muscular dystrophy. Cellulitis is improving. He is high risk for aspiration Principal Problem:   Cellulitis of right leg Active Problems:   Sepsis ([place])   HTN (hypertension)   Neurogenic bladder   Chronic indwelling Foley catheter   Atrial fibrillation with rapid ventricular response (HCC)   Congestive heart failure (HCC)   Pressure injury of skin    Plan: Continue treatments. Try to get him up more. Continue with physical therapy help. Continue with aspiration precautions.    LOS: 7 days   Gwynevere Lizana L 07/02/2016, 7:11 AM

## 2016-07-03 LAB — CBC WITH DIFFERENTIAL/PLATELET
BASOS ABS: 0 10*3/uL (ref 0.0–0.1)
BASOS PCT: 0 %
EOS ABS: 0.1 10*3/uL (ref 0.0–0.7)
Eosinophils Relative: 1 %
HCT: 34.2 % — ABNORMAL LOW (ref 39.0–52.0)
Hemoglobin: 11.3 g/dL — ABNORMAL LOW (ref 13.0–17.0)
Lymphocytes Relative: 13 %
Lymphs Abs: 1.7 10*3/uL (ref 0.7–4.0)
MCH: 33.4 pg (ref 26.0–34.0)
MCHC: 33 g/dL (ref 30.0–36.0)
MCV: 101.2 fL — ABNORMAL HIGH (ref 78.0–100.0)
Monocytes Absolute: 1.2 10*3/uL — ABNORMAL HIGH (ref 0.1–1.0)
Monocytes Relative: 9 %
Neutro Abs: 9.8 10*3/uL — ABNORMAL HIGH (ref 1.7–7.7)
Neutrophils Relative %: 77 %
PLATELETS: 285 10*3/uL (ref 150–400)
RBC: 3.38 MIL/uL — AB (ref 4.22–5.81)
RDW: 13.7 % (ref 11.5–15.5)
WBC: 12.8 10*3/uL — AB (ref 4.0–10.5)

## 2016-07-03 LAB — BASIC METABOLIC PANEL
ANION GAP: 8 (ref 5–15)
BUN: 24 mg/dL — ABNORMAL HIGH (ref 6–20)
CALCIUM: 7.6 mg/dL — AB (ref 8.9–10.3)
CHLORIDE: 105 mmol/L (ref 101–111)
CO2: 25 mmol/L (ref 22–32)
Creatinine, Ser: 1.32 mg/dL — ABNORMAL HIGH (ref 0.61–1.24)
GFR calc non Af Amer: 50 mL/min — ABNORMAL LOW (ref >60)
GFR, EST AFRICAN AMERICAN: 58 mL/min — AB (ref >60)
Glucose, Bld: 101 mg/dL — ABNORMAL HIGH (ref 65–99)
Potassium: 5.3 mmol/L — ABNORMAL HIGH (ref 3.5–5.1)
Sodium: 138 mmol/L (ref 135–145)

## 2016-07-03 MED ORDER — FLUCONAZOLE 100 MG PO TABS
100.0000 mg | ORAL_TABLET | Freq: Every day | ORAL | Status: DC
  Administered 2016-07-04 – 2016-07-06 (×3): 100 mg via ORAL
  Filled 2016-07-03 (×3): qty 1

## 2016-07-03 MED ORDER — FLUCONAZOLE 100 MG PO TABS
200.0000 mg | ORAL_TABLET | Freq: Once | ORAL | Status: AC
  Administered 2016-07-03: 200 mg via ORAL
  Filled 2016-07-03: qty 2

## 2016-07-03 MED ORDER — CLINDAMYCIN HCL 150 MG PO CAPS
300.0000 mg | ORAL_CAPSULE | Freq: Three times a day (TID) | ORAL | Status: DC
Start: 2016-07-03 — End: 2016-07-06
  Administered 2016-07-03 – 2016-07-06 (×10): 300 mg via ORAL
  Filled 2016-07-03 (×4): qty 2
  Filled 2016-07-03: qty 1
  Filled 2016-07-03 (×7): qty 2
  Filled 2016-07-03: qty 1
  Filled 2016-07-03 (×4): qty 2

## 2016-07-03 NOTE — Discharge Instructions (Signed)

## 2016-07-03 NOTE — Progress Notes (Signed)
Subjective: He's having trouble with his mouth now. He has what looks like thrush. No other new complaints. He is still very weak. He is more amenable to nursing home placement now. No chest pain. His breathing is better. His leg looks better.  Objective: Vital signs in last 24 hours: Temp:  [98.5 F (36.9 C)-99.1 F (37.3 C)] 98.8 F (37.1 C) (02/07 0400) Pulse Rate:  [50-85] 53 (02/07 0400) Resp:  [14-25] 18 (02/07 0400) BP: (111-162)/(42-121) 129/56 (02/07 0400) SpO2:  [90 %-100 %] 96 % (02/07 0400) Weight:  [57.3 kg (126 lb 5.2 oz)] 57.3 kg (126 lb 5.2 oz) (02/07 0500) Weight change: -2.8 kg (-6 lb 2.8 oz) Last BM Date: 06/30/16  Intake/Output from previous day: 02/06 0701 - 02/07 0700 In: 800 [P.O.:550; IV Piggyback:250] Out: 2750 [Urine:2750]  PHYSICAL EXAM General appearance: alert, cooperative and Speech difficult to understand because of the infection in his mouth Resp: He still has rhonchi but clearer Cardio: irregularly irregular rhythm GI: soft, non-tender; bowel sounds normal; no masses,  no organomegaly Extremities: Cellulitis looks better Skin warm and dry. Mouth inflamed  Lab Results:  Results for orders placed or performed during the hospital encounter of 06/24/16 (from the past 48 hour(s))  Basic metabolic panel     Status: Abnormal   Collection Time: 07/02/16  4:09 AM  Result Value Ref Range   Sodium 137 135 - 145 mmol/L   Potassium 4.7 3.5 - 5.1 mmol/L    Comment: DELTA CHECK NOTED   Chloride 107 101 - 111 mmol/L   CO2 23 22 - 32 mmol/L   Glucose, Bld 102 (H) 65 - 99 mg/dL   BUN 34 (H) 6 - 20 mg/dL   Creatinine, Ser 1.60 (H) 0.61 - 1.24 mg/dL   Calcium 7.6 (L) 8.9 - 10.3 mg/dL   GFR calc non Af Amer 40 (L) >60 mL/min   GFR calc Af Amer 46 (L) >60 mL/min    Comment: (NOTE) The eGFR has been calculated using the CKD EPI equation. This calculation has not been validated in all clinical situations. eGFR's persistently <60 mL/min signify possible  Chronic Kidney Disease.    Anion gap 7 5 - 15  CBC with Differential/Platelet     Status: Abnormal   Collection Time: 07/02/16  4:09 AM  Result Value Ref Range   WBC 13.3 (H) 4.0 - 10.5 K/uL   RBC 3.55 (L) 4.22 - 5.81 MIL/uL   Hemoglobin 11.8 (L) 13.0 - 17.0 g/dL   HCT 35.9 (L) 39.0 - 52.0 %   MCV 101.1 (H) 78.0 - 100.0 fL   MCH 33.2 26.0 - 34.0 pg   MCHC 32.9 30.0 - 36.0 g/dL   RDW 13.7 11.5 - 15.5 %   Platelets 244 150 - 400 K/uL   Neutrophils Relative % 77 %   Lymphocytes Relative 14 %   Monocytes Relative 8 %   Eosinophils Relative 1 %   Basophils Relative 0 %   Neutro Abs 10.2 (H) 1.7 - 7.7 K/uL   Lymphs Abs 1.9 0.7 - 4.0 K/uL   Monocytes Absolute 1.1 (H) 0.1 - 1.0 K/uL   Eosinophils Absolute 0.1 0.0 - 0.7 K/uL   Basophils Absolute 0.0 0.0 - 0.1 K/uL   WBC Morphology ATYPICAL LYMPHOCYTES   Basic metabolic panel     Status: Abnormal   Collection Time: 07/03/16  3:57 AM  Result Value Ref Range   Sodium 138 135 - 145 mmol/L   Potassium 5.3 (H) 3.5 - 5.1  mmol/L   Chloride 105 101 - 111 mmol/L   CO2 25 22 - 32 mmol/L   Glucose, Bld 101 (H) 65 - 99 mg/dL   BUN 24 (H) 6 - 20 mg/dL   Creatinine, Ser 0.56 (H) 0.61 - 1.24 mg/dL   Calcium 7.6 (L) 8.9 - 10.3 mg/dL   GFR calc non Af Amer 50 (L) >60 mL/min   GFR calc Af Amer 58 (L) >60 mL/min    Comment: (NOTE) The eGFR has been calculated using the CKD EPI equation. This calculation has not been validated in all clinical situations. eGFR's persistently <60 mL/min signify possible Chronic Kidney Disease.    Anion gap 8 5 - 15  CBC with Differential/Platelet     Status: Abnormal   Collection Time: 07/03/16  3:57 AM  Result Value Ref Range   WBC 12.8 (H) 4.0 - 10.5 K/uL   RBC 3.38 (L) 4.22 - 5.81 MIL/uL   Hemoglobin 11.3 (L) 13.0 - 17.0 g/dL   HCT 28.2 (L) 74.0 - 71.8 %   MCV 101.2 (H) 78.0 - 100.0 fL   MCH 33.4 26.0 - 34.0 pg   MCHC 33.0 30.0 - 36.0 g/dL   RDW 45.1 86.2 - 10.9 %   Platelets 285 150 - 400 K/uL    Neutrophils Relative % 77 %   Lymphocytes Relative 13 %   Monocytes Relative 9 %   Eosinophils Relative 1 %   Basophils Relative 0 %   Neutro Abs 9.8 (H) 1.7 - 7.7 K/uL   Lymphs Abs 1.7 0.7 - 4.0 K/uL   Monocytes Absolute 1.2 (H) 0.1 - 1.0 K/uL   Eosinophils Absolute 0.1 0.0 - 0.7 K/uL   Basophils Absolute 0.0 0.0 - 0.1 K/uL   WBC Morphology ATYPICAL LYMPHOCYTES     ABGS No results for input(s): PHART, PO2ART, TCO2, HCO3 in the last 72 hours.  Invalid input(s): PCO2 CULTURES Recent Results (from the past 240 hour(s))  Urine culture     Status: Abnormal   Collection Time: 06/24/16  6:24 AM  Result Value Ref Range Status   Specimen Description URINE, CLEAN CATCH  Final   Special Requests NONE  Final   Culture (A)  Final    <10,000 COLONIES/mL INSIGNIFICANT GROWTH Performed at [facility], [address] [place], [place] [address]    Report Status 06/25/2016 FINAL  Final  Blood culture (routine x 2)     Status: None   Collection Time: 06/24/16  8:13 AM  Result Value Ref Range Status   Specimen Description BLOOD RIGHT HAND  Final   Special Requests BOTTLES DRAWN AEROBIC AND ANAEROBIC 6CC  Final   Culture NO GROWTH 5 DAYS  Final   Report Status 06/29/2016 FINAL  Final  Blood culture (routine x 2)     Status: None   Collection Time: 06/24/16  8:22 AM  Result Value Ref Range Status   Specimen Description BLOOD LEFT ARM  Final   Special Requests BOTTLES DRAWN AEROBIC AND ANAEROBIC 6CC  Final   Culture NO GROWTH 5 DAYS  Final   Report Status 06/29/2016 FINAL  Final  MRSA PCR Screening     Status: None   Collection Time: 06/30/16  2:13 AM  Result Value Ref Range Status   MRSA by PCR NEGATIVE NEGATIVE Final    Comment:        The GeneXpert MRSA Assay (FDA approved for NASAL specimens only), is one component of a comprehensive MRSA colonization surveillance program. It  is not intended to diagnose MRSA infection nor to guide or monitor treatment for MRSA  infections.    Studies/Results: No results found.  Medications:  Prior to Admission:  Prescriptions Prior to Admission  Medication Sig Dispense Refill Last Dose  . ALPRAZolam (XANAX) 0.25 MG tablet Take 0.25 mg by mouth 5 (five) times daily as needed for anxiety.    unknown  . CRANBERRY PO Take 1 tablet by mouth daily.   Past Week at Unknown time  . Docusate Calcium (STOOL SOFTENER PO) Take 1 tablet by mouth 2 (two) times daily.   Past Week at Unknown time  . escitalopram (LEXAPRO) 10 MG tablet Take 1 tablet by mouth daily.   Past Week at Unknown time  . HYDROcodone-acetaminophen (NORCO/VICODIN) 5-325 MG per tablet Take 1 tablet by mouth every 4 (four) hours as needed.   Past Week at Unknown time  . Meth-Hyo-M Bl-Na Phos-Ph Sal (URIBEL) 118 MG CAPS Take 118 capsules by mouth as needed.   Past Week at Unknown time  . mirabegron ER (MYRBETRIQ) 50 MG TB24 tablet Take 50 mg by mouth as needed.   unknown  . NITRO-BID 2 % ointment APPLY A PEA SIZE AMOUNT TO THE ANAL CAVITY EVERY 6 HOURS AS NEEDED. 30 g 0 unknown  . NON FORMULARY Potassium   OTC    One daily   Past Week at Unknown time  . polyethylene glycol (MIRALAX / GLYCOLAX) packet Take 17 g by mouth daily as needed.   Past Month at Unknown time  . SPIRIVA HANDIHALER 18 MCG inhalation capsule Place 1 capsule into inhaler and inhale daily.   Past Week at Unknown time  . torsemide (DEMADEX) 100 MG tablet Take 0.5 tablets by mouth daily.   Past Week at Unknown time  . Tripelennamine HCl POWD Take 25 mg by mouth 2 (two) times daily.    Past Week at Unknown time  . valsartan (DIOVAN) 80 MG tablet Take 80 mg by mouth daily.     Past Week at Unknown time  . Wheat Dextrin (BENEFIBER PO) Take 1 tablet by mouth daily.   Past Week at Unknown time   Scheduled: . albuterol  2.5 mg Nebulization TID  . ALPRAZolam  0.25 mg Oral 5 X Daily  . apixaban  2.5 mg Oral BID  . clindamycin (CLEOCIN) IV  600 mg Intravenous Q8H  . diltiazem  60 mg Oral Q6H  .  docusate sodium  100 mg Oral BID  . escitalopram  10 mg Oral Daily  . furosemide  40 mg Intravenous Q12H  . irbesartan  150 mg Oral Daily  . magic mouthwash  10 mL Oral QID  . mouth rinse  15 mL Mouth Rinse BID  . mirabegron ER  50 mg Oral Daily  . polyethylene glycol  17 g Oral Daily  . tiotropium  1 capsule Inhalation Daily   Continuous: . sodium chloride 10 mL/hr at 07/01/16 7564   PPI:[id number] **OR** acetaminophen, HYDROcodone-acetaminophen, morphine injection, ondansetron **OR** ondansetron (ZOFRAN) IV, phenol, phenol, senna-docusate, traZODone  Assesment: He is admitted with cellulitis of the right leg. He has developed COPD exacerbation, atrial fib with rapid ventricular response probably as a result of the COPD exacerbation congestive heart failure that I think was from volume replacement because he had mild sepsis with his cellulitis. He has improved. He is very weak. It appears that he would be best served by a rehabilitation stay and he's vacillating a little bit on whether he would do it.  Principal Problem:   Cellulitis of right leg Active Problems:   Sepsis ([place])   HTN (hypertension)   Neurogenic bladder   Chronic indwelling Foley catheter   Atrial fibrillation with rapid ventricular response (HCC)   Congestive heart failure (HCC)   Pressure injury of skin    Plan: No change. Transfer from stepdown his potassium level is 5.3 so I will discontinue potassium replacement for now. Continue with his magic mouthwash for thrush. He's having significant issues on going to add some Diflucan as well. Change to oral clindamycin    LOS: 8 days   Mykiah Schmuck L 07/03/2016, 7:58 AM

## 2016-07-03 NOTE — Clinical Social Work Placement (Signed)
   CLINICAL SOCIAL WORK PLACEMENT  NOTE  Date:  07/03/2016  Patient Details  Name: Erik Atkins MRN: [id number] Date of Birth: [birth year]  Clinical Social Work is seeking post-discharge placement for this patient at the [place] level of care (*CSW will initial, date and re-position this form in  chart as items are completed):  Yes   Patient/family provided with [facility] Work Department's list of facilities offering this level of care within the geographic area requested by the patient (or if unable, by the patient's family).  Yes   Patient/family informed of their freedom to choose among providers that offer the needed level of care, that participate in Medicare, Medicaid or managed care program needed by the patient, have an available bed and are willing to accept the patient.  Yes   Patient/family informed of [facility]'s ownership interest in [facility] and [facility], as well as of the fact that they are under no obligation to receive care at these facilities.  PASRR submitted to EDS on       PASRR number received on       Existing PASRR number confirmed on 07/02/16     FL2 transmitted to all facilities in geographic area requested by pt/family on 07/02/16     FL2 transmitted to all facilities within larger geographic area on       Patient informed that his/her managed care company has contracts with or will negotiate with certain facilities, including the following:        Yes   Patient/family informed of bed offers received.  Patient chooses bed at [facility]     Physician recommends and patient chooses bed at      Patient to be transferred to [facility] on  .  Patient to be transferred to facility by       Patient family notified on   of transfer.  Name of family member notified:        PHYSICIAN       Additional Comment:    _______________________________________________ Salome Arnt, [place] 07/03/2016, 8:43 AM [phone]

## 2016-07-04 LAB — BASIC METABOLIC PANEL
Anion gap: 9 (ref 5–15)
BUN: 29 mg/dL — AB (ref 6–20)
CHLORIDE: 102 mmol/L (ref 101–111)
CO2: 28 mmol/L (ref 22–32)
CREATININE: 1.61 mg/dL — AB (ref 0.61–1.24)
Calcium: 7.8 mg/dL — ABNORMAL LOW (ref 8.9–10.3)
GFR calc Af Amer: 46 mL/min — ABNORMAL LOW (ref >60)
GFR calc non Af Amer: 40 mL/min — ABNORMAL LOW (ref >60)
Glucose, Bld: 127 mg/dL — ABNORMAL HIGH (ref 65–99)
Potassium: 4.6 mmol/L (ref 3.5–5.1)
Sodium: 139 mmol/L (ref 135–145)

## 2016-07-04 MED ORDER — FUROSEMIDE 10 MG/ML IJ SOLN
40.0000 mg | Freq: Every day | INTRAMUSCULAR | Status: DC
  Administered 2016-07-04 – 2016-07-06 (×3): 40 mg via INTRAVENOUS
  Filled 2016-07-04 (×3): qty 4

## 2016-07-04 MED ORDER — CLOTRIMAZOLE 10 MG MT TROC
10.0000 mg | Freq: Every day | OROMUCOSAL | Status: DC
  Administered 2016-07-04 – 2016-07-06 (×10): 10 mg via ORAL
  Filled 2016-07-04 (×22): qty 1

## 2016-07-04 NOTE — Plan of Care (Signed)
Problem: Physical Regulation: Goal: Ability to maintain clinical measurements within normal limits will improve Outcome: Progressing See vital sign doc flowsheet.  Problem: Skin Integrity: Goal: Risk for impaired skin integrity will decrease Outcome: Not Progressing Educated pt on the importance of repositioning and how to prevent skin breakdown.  Problem: Tissue Perfusion: Goal: Risk factors for ineffective tissue perfusion will decrease Outcome: Progressing Pt on Eliquis.  Problem: Bowel/Gastric: Goal: Will not experience complications related to bowel motility Outcome: Not Progressing See MAR. Pt states he has not had a bowel movement in over four days. Given prn Senokot.

## 2016-07-04 NOTE — Progress Notes (Signed)
Physical Therapy Treatment Patient Details Name: Erik Atkins MRN: [id number] DOB: [birth year] Today's Date: 07/04/2016    History of Present Illness 76 yo M admitted due to Rectal pain, and accidental Foley removal.  He was also noted to have cellulitis of the right leg and sepsis from that, and COPD exacerbation. He has developed atrial fibrillation with rapid ventricular response and was transferred to the ICU. His sepsis has improved. He still has tachycardia. 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    PT Comments    PT took extended time to complete mobility this session due to extreme fatigue.  Pt requires mod assist with bed mobility and extreme difficulty to obtain and maintain sitting balance.  Pt with forward bend posturing and leans posteriorly and to the left with sitting.  Attempted standing as patient was eager to attempt.  Pt able to stand with mod assist but unable to maintain full balance independently due to unable to get feet completely flat (ankle contractures).  Transferred patient to chair with mod assist and positioned heels in floating position.  Made nurse aware of mobility status and need to transfer back to bed using stedy.      Follow Up Recommendations  SNF;Other (comment) (Pt expressed that he does not want to go to SNF.  If he chooses to go home, he will need HHPT and a w/c due to high fall risk.  Skilled wound care for Bilateral feet)           Precautions / Restrictions Precautions Precautions: Fall Restrictions Weight Bearing Restrictions: No    Mobility  Bed Mobility Overal bed mobility: Needs Assistance Bed Mobility: Supine to Sit     Supine to sit: Min assist;HOB elevated        Transfers Overall transfer level: Needs assistance Equipment used: Rolling walker (2 wheeled) Transfers: Risk manager;Sit to/from Stand Sit to Stand: Mod assist Stand pivot transfers: Mod assist        General transfer comment: Pt very weak this session and difficulty obtaining/maintaining seated posturing.  Pt able to stand in walker with mod assist but unable to acquire adequate balance and place feet completely flat on the floor.     Modified Rankin (Stroke Patients Only)       Balance Overall balance assessment: Needs assistance Sitting-balance support: Bilateral upper extremity supported;Feet supported   Sitting balance - Comments: Pt with posterior and Lt lateral lean.  Able to stay upright but not in good posturing/curled forward                            Cognition Arousal/Alertness: Lethargic Behavior During Therapy: [facility] for tasks assessed/performed Overall Cognitive Status: Impaired/Different from baseline                      Exercises      General Comments  worked on sitting balance mostly this session and mobility to scoot to edge of bed and back into chair.      Pertinent Vitals/Pain Pain Assessment: 0-10 Faces Pain Scale: Hurts little more Pain Location: B feet Pain Descriptors / Indicators: Grimacing Pain Intervention(s): Repositioned;Limited activity within patient's tolerance           PT Goals (current goals can now be found in the care plan section) Progress towards PT goals: Progressing toward goals    Frequency    Min 3X/week      PT  Plan Current plan remains appropriate    Co-evaluation             End of Session Equipment Utilized During Treatment: Gait belt;Oxygen Activity Tolerance: Patient limited by fatigue Patient left: in chair;with call bell/phone within reach     Time: 1330-1418 PT Time Calculation (min) (ACUTE ONLY): 48 min  Charges:  $Therapeutic Activity: 38-52 mins                    Teena Irani, PTA/CLT [phone]  07/04/2016, 5:00 PM

## 2016-07-04 NOTE — Progress Notes (Signed)
Subjective: He was admitted with cellulitis. He had sepsis and was given volume replacement. He developed atrial fibrillation rapid ventricular response heart failure and COPD exacerbation. His cellulitis is better. He now has significant problems with his mouth. He is better with his atrial fib. He is better with heart failure. He's better with COPD exacerbation. He is extremely weak and is going to need to go to skilled care facility for rehabilitation  Objective: Vital signs in last 24 hours: Temp:  [97.5 F (36.4 C)-99.7 F (37.6 C)] 98.1 F (36.7 C) (02/08 0500) Pulse Rate:  [37-123] 55 (02/08 0500) Resp:  [16-26] 18 (02/08 0500) BP: (132-183)/(34-78) 132/45 (02/08 0527) SpO2:  [85 %-98 %] 97 % (02/08 0500) Weight change:  Last BM Date: 06/30/16  Intake/Output from previous day: 02/07 0701 - 02/08 0700 In: 1128.3 [P.O.:480; I.V.:648.3] Out: 1650 [Urine:1650]  PHYSICAL EXAM General appearance: alert, cooperative and moderate distress Resp: clear to auscultation bilaterally Cardio: irregularly irregular rhythm GI: soft, non-tender; bowel sounds normal; no masses,  no organomegaly Extremities: The cellulitis looks better His mouth shows significant stomatitis. Skin warm and dry  Lab Results:  Results for orders placed or performed during the hospital encounter of 06/24/16 (from the past 48 hour(s))  Basic metabolic panel     Status: Abnormal   Collection Time: 07/03/16  3:57 AM  Result Value Ref Range   Sodium 138 135 - 145 mmol/L   Potassium 5.3 (H) 3.5 - 5.1 mmol/L   Chloride 105 101 - 111 mmol/L   CO2 25 22 - 32 mmol/L   Glucose, Bld 101 (H) 65 - 99 mg/dL   BUN 24 (H) 6 - 20 mg/dL   Creatinine, Ser 1.32 (H) 0.61 - 1.24 mg/dL   Calcium 7.6 (L) 8.9 - 10.3 mg/dL   GFR calc non Af Amer 50 (L) >60 mL/min   GFR calc Af Amer 58 (L) >60 mL/min    Comment: (NOTE) The eGFR has been calculated using the CKD EPI equation. This calculation has not been validated in all clinical  situations. eGFR's persistently <60 mL/min signify possible Chronic Kidney Disease.    Anion gap 8 5 - 15  CBC with Differential/Platelet     Status: Abnormal   Collection Time: 07/03/16  3:57 AM  Result Value Ref Range   WBC 12.8 (H) 4.0 - 10.5 K/uL   RBC 3.38 (L) 4.22 - 5.81 MIL/uL   Hemoglobin 11.3 (L) 13.0 - 17.0 g/dL   HCT 34.2 (L) 39.0 - 52.0 %   MCV 101.2 (H) 78.0 - 100.0 fL   MCH 33.4 26.0 - 34.0 pg   MCHC 33.0 30.0 - 36.0 g/dL   RDW 13.7 11.5 - 15.5 %   Platelets 285 150 - 400 K/uL   Neutrophils Relative % 77 %   Lymphocytes Relative 13 %   Monocytes Relative 9 %   Eosinophils Relative 1 %   Basophils Relative 0 %   Neutro Abs 9.8 (H) 1.7 - 7.7 K/uL   Lymphs Abs 1.7 0.7 - 4.0 K/uL   Monocytes Absolute 1.2 (H) 0.1 - 1.0 K/uL   Eosinophils Absolute 0.1 0.0 - 0.7 K/uL   Basophils Absolute 0.0 0.0 - 0.1 K/uL   WBC Morphology ATYPICAL LYMPHOCYTES   Basic metabolic panel     Status: Abnormal   Collection Time: 07/04/16  5:14 AM  Result Value Ref Range   Sodium 139 135 - 145 mmol/L   Potassium 4.6 3.5 - 5.1 mmol/L   Chloride 102 101 -  111 mmol/L   CO2 28 22 - 32 mmol/L   Glucose, Bld 127 (H) 65 - 99 mg/dL   BUN 29 (H) 6 - 20 mg/dL   Creatinine, Ser 1.61 (H) 0.61 - 1.24 mg/dL   Calcium 7.8 (L) 8.9 - 10.3 mg/dL   GFR calc non Af Amer 40 (L) >60 mL/min   GFR calc Af Amer 46 (L) >60 mL/min    Comment: (NOTE) The eGFR has been calculated using the CKD EPI equation. This calculation has not been validated in all clinical situations. eGFR's persistently <60 mL/min signify possible Chronic Kidney Disease.    Anion gap 9 5 - 15    ABGS No results for input(s): PHART, PO2ART, TCO2, HCO3 in the last 72 hours.  Invalid input(s): PCO2 CULTURES Recent Results (from the past 240 hour(s))  MRSA PCR Screening     Status: None   Collection Time: 06/30/16  2:13 AM  Result Value Ref Range Status   MRSA by PCR NEGATIVE NEGATIVE Final    Comment:        The GeneXpert MRSA  Assay (FDA approved for NASAL specimens only), is one component of a comprehensive MRSA colonization surveillance program. It is not intended to diagnose MRSA infection nor to guide or monitor treatment for MRSA infections.    Studies/Results: No results found.  Medications:  Prior to Admission:  Prescriptions Prior to Admission  Medication Sig Dispense Refill Last Dose  . ALPRAZolam (XANAX) 0.25 MG tablet Take 0.25 mg by mouth 5 (five) times daily as needed for anxiety.    unknown  . CRANBERRY PO Take 1 tablet by mouth daily.   Past Week at Unknown time  . Docusate Calcium (STOOL SOFTENER PO) Take 1 tablet by mouth 2 (two) times daily.   Past Week at Unknown time  . escitalopram (LEXAPRO) 10 MG tablet Take 1 tablet by mouth daily.   Past Week at Unknown time  . HYDROcodone-acetaminophen (NORCO/VICODIN) 5-325 MG per tablet Take 1 tablet by mouth every 4 (four) hours as needed.   Past Week at Unknown time  . Meth-Hyo-M Bl-Na Phos-Ph Sal (URIBEL) 118 MG CAPS Take 118 capsules by mouth as needed.   Past Week at Unknown time  . mirabegron ER (MYRBETRIQ) 50 MG TB24 tablet Take 50 mg by mouth as needed.   unknown  . NITRO-BID 2 % ointment APPLY A PEA SIZE AMOUNT TO THE ANAL CAVITY EVERY 6 HOURS AS NEEDED. 30 g 0 unknown  . NON FORMULARY Potassium   OTC    One daily   Past Week at Unknown time  . polyethylene glycol (MIRALAX / GLYCOLAX) packet Take 17 g by mouth daily as needed.   Past Month at Unknown time  . SPIRIVA HANDIHALER 18 MCG inhalation capsule Place 1 capsule into inhaler and inhale daily.   Past Week at Unknown time  . torsemide (DEMADEX) 100 MG tablet Take 0.5 tablets by mouth daily.   Past Week at Unknown time  . Tripelennamine HCl POWD Take 25 mg by mouth 2 (two) times daily.    Past Week at Unknown time  . valsartan (DIOVAN) 80 MG tablet Take 80 mg by mouth daily.     Past Week at Unknown time  . Wheat Dextrin (BENEFIBER PO) Take 1 tablet by mouth daily.   Past Week at  Unknown time   Scheduled: . albuterol  2.5 mg Nebulization TID  . ALPRAZolam  0.25 mg Oral 5 X Daily  . apixaban  2.5 mg  Oral BID  . clindamycin  300 mg Oral Q8H  . diltiazem  60 mg Oral Q6H  . docusate sodium  100 mg Oral BID  . escitalopram  10 mg Oral Daily  . fluconazole  100 mg Oral Daily  . furosemide  40 mg Intravenous Daily  . irbesartan  150 mg Oral Daily  . magic mouthwash  10 mL Oral QID  . mouth rinse  15 mL Mouth Rinse BID  . mirabegron ER  50 mg Oral Daily  . polyethylene glycol  17 g Oral Daily  . tiotropium  1 capsule Inhalation Daily   Continuous: . sodium chloride 10 mL/hr at 07/01/16 6606   [id number] **OR** acetaminophen, HYDROcodone-acetaminophen, morphine injection, ondansetron **OR** ondansetron (ZOFRAN) IV, phenol, phenol, senna-docusate, traZODone  Assesment: He was admitted with cellulitis of the right leg and sepsis. He has developed atrial fibrillation with rapid ventricular response but that's better. He developed congestive heart failure and that is better. He had COPD exacerbation which is better. 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 He has what is probably Candida Principal Problem:   Cellulitis of right leg Active Problems:   Sepsis ([place])   HTN (hypertension)   Neurogenic bladder   Chronic indwelling Foley catheter   Atrial fibrillation with rapid ventricular response (HCC)   Congestive heart failure (HCC)   Pressure injury of skin    Plan: Try Mycelex troches. Continue his other treatments. Possibly transfer to skilled care facility tomorrow    LOS: 9 days   Nzinga Ferran L 07/04/2016, 9:02 AM

## 2016-07-05 MED ORDER — ALPRAZOLAM 0.25 MG PO TABS
0.2500 mg | ORAL_TABLET | Freq: Three times a day (TID) | ORAL | Status: DC
  Administered 2016-07-05 – 2016-07-06 (×4): 0.25 mg via ORAL
  Filled 2016-07-05 (×4): qty 1

## 2016-07-05 NOTE — Care Management Important Message (Signed)
Important Message  Patient Details  Name: Erik Atkins MRN: [id number] Date of Birth: 11-Jan-1941   Medicare Important Message Given:  Yes    Sherald Barge, RN 07/05/2016, 3:14 PM

## 2016-07-05 NOTE — Progress Notes (Signed)
Subjective: He is sleepy again this morning. He is generally much better. He was admitted with cellulitis and sepsis has developed COPD exacerbation heart failure and atrial fib. At baseline he has Charcot Marie tooth muscular dystrophy. He is on 5 times a day Xanax from home so I'm going to cut that back. He's having more problems with his mouth but it is slowly getting better  Objective: Vital signs in last 24 hours: Temp:  [97.3 F (36.3 C)-98.3 F (36.8 C)] 97.6 F (36.4 C) (02/09 0530) Pulse Rate:  [67-72] 67 (02/09 0530) Resp:  [20-126] 20 (02/09 0530) BP: (116-130)/(41-90) 130/90 (02/09 0530) SpO2:  [92 %-100 %] 100 % (02/09 0530) Weight change:  Last BM Date: 06/30/16  Intake/Output from previous day: 02/08 0701 - 02/09 0700 In: 720 [P.O.:720] Out: 750 [Urine:750]  PHYSICAL EXAM General appearance: alert, cooperative and mild distress Resp: rhonchi bilaterally Cardio: irregularly irregular rhythm GI: soft, non-tender; bowel sounds normal; no masses,  no organomegaly Extremities: His cellulitis is much improved He still has some skin lesions. His mouth is still inflamed but somewhat better  Lab Results:  Results for orders placed or performed during the hospital encounter of 06/24/16 (from the past 48 hour(s))  Basic metabolic panel     Status: Abnormal   Collection Time: 07/04/16  5:14 AM  Result Value Ref Range   Sodium 139 135 - 145 mmol/L   Potassium 4.6 3.5 - 5.1 mmol/L   Chloride 102 101 - 111 mmol/L   CO2 28 22 - 32 mmol/L   Glucose, Bld 127 (H) 65 - 99 mg/dL   BUN 29 (H) 6 - 20 mg/dL   Creatinine, Ser 1.61 (H) 0.61 - 1.24 mg/dL   Calcium 7.8 (L) 8.9 - 10.3 mg/dL   GFR calc non Af Amer 40 (L) >60 mL/min   GFR calc Af Amer 46 (L) >60 mL/min    Comment: (NOTE) The eGFR has been calculated using the CKD EPI equation. This calculation has not been validated in all clinical situations. eGFR's persistently <60 mL/min signify possible Chronic Kidney Disease.     Anion gap 9 5 - 15    ABGS No results for input(s): PHART, PO2ART, TCO2, HCO3 in the last 72 hours.  Invalid input(s): PCO2 CULTURES Recent Results (from the past 240 hour(s))  MRSA PCR Screening     Status: None   Collection Time: 06/30/16  2:13 AM  Result Value Ref Range Status   MRSA by PCR NEGATIVE NEGATIVE Final    Comment:        The GeneXpert MRSA Assay (FDA approved for NASAL specimens only), is one component of a comprehensive MRSA colonization surveillance program. It is not intended to diagnose MRSA infection nor to guide or monitor treatment for MRSA infections.    Studies/Results: No results found.  Medications:  Prior to Admission:  Prescriptions Prior to Admission  Medication Sig Dispense Refill Last Dose  . ALPRAZolam (XANAX) 0.25 MG tablet Take 0.25 mg by mouth 5 (five) times daily as needed for anxiety.    unknown  . CRANBERRY PO Take 1 tablet by mouth daily.   Past Week at Unknown time  . Docusate Calcium (STOOL SOFTENER PO) Take 1 tablet by mouth 2 (two) times daily.   Past Week at Unknown time  . escitalopram (LEXAPRO) 10 MG tablet Take 1 tablet by mouth daily.   Past Week at Unknown time  . HYDROcodone-acetaminophen (NORCO/VICODIN) 5-325 MG per tablet Take 1 tablet by mouth every 4 (  four) hours as needed.   Past Week at Unknown time  . Meth-Hyo-M Bl-Na Phos-Ph Sal (URIBEL) 118 MG CAPS Take 118 capsules by mouth as needed.   Past Week at Unknown time  . mirabegron ER (MYRBETRIQ) 50 MG TB24 tablet Take 50 mg by mouth as needed.   unknown  . NITRO-BID 2 % ointment APPLY A PEA SIZE AMOUNT TO THE ANAL CAVITY EVERY 6 HOURS AS NEEDED. 30 g 0 unknown  . NON FORMULARY Potassium   OTC    One daily   Past Week at Unknown time  . polyethylene glycol (MIRALAX / GLYCOLAX) packet Take 17 g by mouth daily as needed.   Past Month at Unknown time  . SPIRIVA HANDIHALER 18 MCG inhalation capsule Place 1 capsule into inhaler and inhale daily.   Past Week at Unknown time   . torsemide (DEMADEX) 100 MG tablet Take 0.5 tablets by mouth daily.   Past Week at Unknown time  . Tripelennamine HCl POWD Take 25 mg by mouth 2 (two) times daily.    Past Week at Unknown time  . valsartan (DIOVAN) 80 MG tablet Take 80 mg by mouth daily.     Past Week at Unknown time  . Wheat Dextrin (BENEFIBER PO) Take 1 tablet by mouth daily.   Past Week at Unknown time   Scheduled: . albuterol  2.5 mg Nebulization TID  . ALPRAZolam  0.25 mg Oral TID  . apixaban  2.5 mg Oral BID  . clindamycin  300 mg Oral Q8H  . clotrimazole  10 mg Oral 5 X Daily  . diltiazem  60 mg Oral Q6H  . docusate sodium  100 mg Oral BID  . escitalopram  10 mg Oral Daily  . fluconazole  100 mg Oral Daily  . furosemide  40 mg Intravenous Daily  . irbesartan  150 mg Oral Daily  . mouth rinse  15 mL Mouth Rinse BID  . mirabegron ER  50 mg Oral Daily  . polyethylene glycol  17 g Oral Daily  . tiotropium  1 capsule Inhalation Daily   Continuous: . sodium chloride 10 mL/hr at 07/05/16 0201   AUQ:[id number] **OR** acetaminophen, HYDROcodone-acetaminophen, morphine injection, ondansetron **OR** ondansetron (ZOFRAN) IV, phenol, phenol, senna-docusate, traZODone  Assesment: He was admitted with cellulitis of the leg. He was septic. He developed COPD exacerbation, atrial fib with RVR and congestive heart failure. He needs to go to a skilled care facility and he has agreed to do that now. He has yeast in his mouth and that is somewhat better. He is very sluggish so I'm going to reduce his Xanax Principal Problem:   Cellulitis of right leg Active Problems:   Sepsis (HCC)   HTN (hypertension)   Neurogenic bladder   Chronic indwelling Foley catheter   Atrial fibrillation with rapid ventricular response (HCC)   Congestive heart failure (HCC)   Pressure injury of skin    Plan: Reduce xanax. He should be ready for transfer tomorrow    LOS: 10 days   Marieme Mcmackin L 07/05/2016, 8:47 AM

## 2016-07-05 NOTE — Clinical Social Work Note (Signed)
Per MD, anticipate d/c tomorrow. Discussed with pt, wife, and [facility]. Facility needs d/c summary by 11. MD aware.   Benay Pike, [place]

## 2016-07-05 NOTE — Progress Notes (Signed)
Speech Language Pathology Treatment: Dysphagia  Patient Details Name: Erik Atkins MRN: [id number] DOB: 06-Dec-1940 Today's Date: 07/05/2016 Time: 1345-1410 SLP Time Calculation (min) (ACUTE ONLY): 25 min  Assessment / Plan / Recommendation Clinical Impression  Dysphagia treatment provided for diet tolerance check. Pt was experiencing foot pain during PO trials which was distracting to the point that pt needed prompting to continue chewing/ swallow what was in the oral cavity; RN informed of pain.  With thin liquids minimal verbal cues provided for small sips. Pt reported continuing to experience intermittent pain when swallowing; ? increased dryness resulting in irritation due to O2 needs. No overt s/s of aspiration noted during PO trials today. Pt demonstrating slowed mastication of regular solids and continues to appear weak; given this along with COPD exacerbation, aspiration risk continues to be elevated. Recommend continuing dysphagia 3 diet/ thin liquids, meds whole in puree, intermittent supervision during meals to cue small bites/ sips and ensure pt in upright position for meal. Will continue to follow for diet tolerance/ consider advancement.   HPI HPI: Erik Atkins is a 76 y.o. 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 This states that last night at around midnight he complained of rectal pain, while straining on the toilet his Foley catheter was accidentally dislodged and she called EMS for transport to the hospital to have the Foley replaced. Upon arrival he was noticed to have what appears to be a right leg cellulitis with signs of sepsis including hypotension, tachycardia, and a lactic acid of 2.54. CT scan of the abdomen was done given his rectal pain that was essentially negative for acute pathologies,urinalysis urinalysis shows many bacteria with too numerous to count WBCs but  negative nitrates. Admission requested      SLP Plan  Continue with current plan of care     Recommendations  Diet recommendations: Dysphagia 3 (mechanical soft);Thin liquid Liquids provided via: Cup;Straw Medication Administration: Whole meds with puree Supervision: Patient able to self feed;Intermittent supervision to cue for compensatory strategies Compensations: Slow rate;Small sips/bites;Multiple dry swallows after each bite/sip;Follow solids with liquid Postural Changes and/or Swallow Maneuvers: Out of bed for meals;Seated upright 90 degrees;Upright 30-60 min after meal                Oral Care Recommendations: Oral care BID Follow up Recommendations: Skilled Nursing facility Plan: Continue with current plan of care       GO                Kern Reap, MA, CCC-SLP 07/05/2016, 2:14 PM

## 2016-07-06 DIAGNOSIS — J441 Chronic obstructive pulmonary disease with (acute) exacerbation: Secondary | ICD-10-CM | POA: Diagnosis not present

## 2016-07-06 DIAGNOSIS — Z743 Need for continuous supervision: Secondary | ICD-10-CM | POA: Diagnosis not present

## 2016-07-06 DIAGNOSIS — L03115 Cellulitis of right lower limb: Secondary | ICD-10-CM | POA: Diagnosis not present

## 2016-07-06 DIAGNOSIS — L03119 Cellulitis of unspecified part of limb: Secondary | ICD-10-CM | POA: Diagnosis not present

## 2016-07-06 DIAGNOSIS — A4189 Other specified sepsis: Secondary | ICD-10-CM | POA: Diagnosis not present

## 2016-07-06 DIAGNOSIS — I509 Heart failure, unspecified: Secondary | ICD-10-CM | POA: Diagnosis not present

## 2016-07-06 DIAGNOSIS — J9690 Respiratory failure, unspecified, unspecified whether with hypoxia or hypercapnia: Secondary | ICD-10-CM | POA: Diagnosis not present

## 2016-07-06 DIAGNOSIS — I10 Essential (primary) hypertension: Secondary | ICD-10-CM | POA: Diagnosis not present

## 2016-07-06 DIAGNOSIS — M6281 Muscle weakness (generalized): Secondary | ICD-10-CM | POA: Diagnosis not present

## 2016-07-06 DIAGNOSIS — R279 Unspecified lack of coordination: Secondary | ICD-10-CM | POA: Diagnosis not present

## 2016-07-06 DIAGNOSIS — R2681 Unsteadiness on feet: Secondary | ICD-10-CM | POA: Diagnosis not present

## 2016-07-06 DIAGNOSIS — G9341 Metabolic encephalopathy: Secondary | ICD-10-CM | POA: Diagnosis not present

## 2016-07-06 DIAGNOSIS — G4700 Insomnia, unspecified: Secondary | ICD-10-CM | POA: Diagnosis not present

## 2016-07-06 DIAGNOSIS — B370 Candidal stomatitis: Secondary | ICD-10-CM | POA: Diagnosis not present

## 2016-07-06 DIAGNOSIS — A419 Sepsis, unspecified organism: Secondary | ICD-10-CM | POA: Diagnosis not present

## 2016-07-06 DIAGNOSIS — I482 Chronic atrial fibrillation: Secondary | ICD-10-CM | POA: Diagnosis not present

## 2016-07-06 DIAGNOSIS — E46 Unspecified protein-calorie malnutrition: Secondary | ICD-10-CM | POA: Diagnosis not present

## 2016-07-06 MED ORDER — TRAZODONE HCL 50 MG PO TABS: 50.0000 mg | ORAL_TABLET | Freq: Every evening | ORAL | Status: DC | PRN

## 2016-07-06 MED ORDER — MIRABEGRON ER 50 MG PO TB24: 50.0000 mg | ORAL_TABLET | Freq: Every day | ORAL | Status: DC

## 2016-07-06 MED ORDER — APIXABAN 2.5 MG PO TABS: 2.5000 mg | ORAL_TABLET | Freq: Two times a day (BID) | ORAL | Status: DC

## 2016-07-06 MED ORDER — PHENOL 1.4 % MT LIQD: 1.0000 | OROMUCOSAL | 0 refills | Status: DC | PRN

## 2016-07-06 MED ORDER — ACETAMINOPHEN 325 MG PO TABS: 650.0000 mg | ORAL_TABLET | Freq: Four times a day (QID) | ORAL | Status: DC | PRN

## 2016-07-06 MED ORDER — IRBESARTAN 150 MG PO TABS: 150.0000 mg | ORAL_TABLET | Freq: Every day | ORAL | Status: DC

## 2016-07-06 MED ORDER — DILTIAZEM HCL ER COATED BEADS 240 MG PO TB24: 240.0000 mg | ORAL_TABLET | Freq: Every day | ORAL | Status: DC

## 2016-07-06 MED ORDER — ONDANSETRON HCL 4 MG PO TABS: 4.0000 mg | ORAL_TABLET | Freq: Four times a day (QID) | ORAL | 0 refills | Status: DC | PRN

## 2016-07-06 MED ORDER — SENNOSIDES-DOCUSATE SODIUM 8.6-50 MG PO TABS: 1.0000 | ORAL_TABLET | Freq: Every evening | ORAL | Status: DC | PRN

## 2016-07-06 MED ORDER — ALBUTEROL SULFATE (2.5 MG/3ML) 0.083% IN NEBU: 2.5000 mg | INHALATION_SOLUTION | Freq: Three times a day (TID) | RESPIRATORY_TRACT | 12 refills | Status: DC

## 2016-07-06 MED ORDER — "AQUACEL AG FOAM 4""X4"" EX PADS": 1.0000 | MEDICATED_PAD | Freq: Two times a day (BID) | CUTANEOUS | Status: DC

## 2016-07-06 MED ORDER — FLUCONAZOLE 100 MG PO TABS: 100.0000 mg | ORAL_TABLET | Freq: Every day | ORAL | Status: DC

## 2016-07-06 MED ORDER — CLOTRIMAZOLE 10 MG MT TROC: 10.0000 mg | Freq: Every day | OROMUCOSAL | Status: DC

## 2016-07-06 NOTE — Discharge Summary (Addendum)
Physician Discharge Summary  Patient ID: RAYANTHONY MOUSSA MRN: [id number] DOB/AGE: [birth year] 77 y.o. Primary Care Physician:Kelani Robart L, MD Admit date: 06/24/2016 Discharge date: 07/06/2016    Discharge Diagnoses:   Principal Problem:   Cellulitis of right leg Active Problems:   Sepsis ([place])   HTN (hypertension)   Neurogenic bladder   Chronic indwelling Foley catheter   Atrial fibrillation with rapid ventricular response (HCC)   Congestive heart failure (HCC)   Pressure injury of skin malnutrition Charcot-Marie-Tooth muscular dystrophy Candida mucositis Copd exacerbation Chronic hypoxic respiratory failure Metabolic encephalopathy Allergies as of 07/06/2016      Reactions   Penicillins    Darvon Swelling   Celexa [citalopram Hydrobromide] Nausea And Vomiting   Cortisone Swelling   Cymbalta [duloxetine Hcl] Nausea Only   Eggs Or Egg-derived Products Nausea And Vomiting   Guaifenesin Er    Keflex [cephalexin] Swelling   Motrin [ibuprofen]    Pumpkin Seed Nausea And Vomiting   All pumpkin products   Sweet Potato Nausea And Vomiting      Tea Nausea And Vomiting   Zoloft [sertraline] Nausea Only      Medication List    STOP taking these medications   BENEFIBER PO   NITRO-BID 2 % ointment Generic drug:  nitroGLYCERIN   NON FORMULARY   URIBEL 118 MG Caps   valsartan 80 MG tablet Commonly known as:  DIOVAN     TAKE these medications   acetaminophen 325 MG tablet Commonly known as:  TYLENOL Take 2 tablets (650 mg total) by mouth every 6 (six) hours as needed for mild pain (or Fever >/= 101).   albuterol (2.5 MG/3ML) 0.083% nebulizer solution Commonly known as:  PROVENTIL Take 3 mLs (2.5 mg total) by nebulization 3 (three) times daily.   ALPRAZolam 0.25 MG tablet Commonly known as:  XANAX Take 0.25 mg by mouth 5 (five) times daily as needed for anxiety.   apixaban 2.5 MG Tabs tablet Commonly known as:  ELIQUIS Take 1 tablet (2.5 mg total) by mouth 2  (two) times daily.   clotrimazole 10 MG troche Commonly known as:  MYCELEX Take 1 tablet (10 mg total) by mouth 5 (five) times daily.   CRANBERRY PO Take 1 tablet by mouth daily.   escitalopram 10 MG tablet Commonly known as:  LEXAPRO Take 1 tablet by mouth daily.   fluconazole 100 MG tablet Commonly known as:  DIFLUCAN Take 1 tablet (100 mg total) by mouth daily.   HYDROcodone-acetaminophen 5-325 MG tablet Commonly known as:  NORCO/VICODIN Take 1 tablet by mouth every 4 (four) hours as needed.   irbesartan 150 MG tablet Commonly known as:  AVAPRO Take 1 tablet (150 mg total) by mouth daily.   mirabegron ER 50 MG Tb24 tablet Commonly known as:  MYRBETRIQ Take 1 tablet (50 mg total) by mouth daily. What changed:  when to take this  reasons to take this   ondansetron 4 MG tablet Commonly known as:  ZOFRAN Take 1 tablet (4 mg total) by mouth every 6 (six) hours as needed for nausea.   phenol 1.4 % Liqd Commonly known as:  CHLORASEPTIC Use as directed 1 spray in the mouth or throat as needed for throat irritation / pain.   polyethylene glycol packet Commonly known as:  MIRALAX / GLYCOLAX Take 17 g by mouth daily as needed.   senna-docusate 8.6-50 MG tablet Commonly known as:  Senokot-S Take 1 tablet by mouth at bedtime as needed for mild constipation.   SPIRIVA  HANDIHALER 18 MCG inhalation capsule Generic drug:  tiotropium Place 1 capsule into inhaler and inhale daily.   STOOL SOFTENER PO Take 1 tablet by mouth 2 (two) times daily.   torsemide 100 MG tablet Commonly known as:  DEMADEX Take 0.5 tablets by mouth daily.   traZODone 50 MG tablet Commonly known as:  DESYREL Take 1 tablet (50 mg total) by mouth at bedtime as needed for sleep.   Tripelennamine HCl Powd Take 25 mg by mouth 2 (two) times daily.       Discharged Condition:Improved    Consults: PT, speech  Significant Diagnostic Studies: Ct Abdomen Pelvis Wo Contrast  Result Date:  06/24/2016 CLINICAL DATA:  Penile and rectal pain for 2 days.  Constipation. EXAM: CT ABDOMEN AND PELVIS WITHOUT CONTRAST TECHNIQUE: Multidetector CT imaging of the abdomen and pelvis was performed following the standard protocol without IV contrast. COMPARISON:  06/22/2012. FINDINGS: Lower chest: Lung bases show mild cylindrical bronchiectasis in both lower lobes. Heart size normal. Coronary artery calcification. No pericardial or pleural effusion. Hepatobiliary: Liver and gallbladder are unremarkable. No biliary ductal dilatation. Pancreas: Negative. Spleen: Negative. Adrenals/Urinary Tract: Adrenal glands are unremarkable. Low-attenuation lesions in the kidneys measure up to 3.1 cm on the right, similar and likely cysts although definitive characterization is difficult without IV contrast. No urinary stones. Ureters are decompressed. Foley catheter is seen in a decompressed bladder. Stomach/Bowel: Stomach and majority of the small bowel are unremarkable. A short segment of nonobstructed small bowel extends into a right inguinal hernia. Appendix is not readily visualized. After amount of stool is seen in the colon. Vascular/Lymphatic: Atherosclerotic calcification of the arterial vasculature without abdominal aortic aneurysm. No pathologically enlarged lymph nodes. Reproductive: Prostate is visualized. Other: Right inguinal hernia contains a short segment of unobstructed small bowel. Small left inguinal hernia contains fat. No free fluid. Mesenteries and peritoneum are unremarkable. Musculoskeletal: Degenerative changes are seen in the hips, left greater than right, and spine. No worrisome lytic or sclerotic lesions. IMPRESSION: 1. No acute findings to explain the patient's symptoms. 2. Small right inguinal hernia contains a short segment of unobstructed small bowel. Small left inguinal hernia contains fat. 3. Fair amount of stool in the colon is indicative of constipation. 4. Aortic atherosclerosis (ICD10-170.0).  Coronary artery calcification. 5. Advanced left hip osteoarthritis. Electronically Signed   By: Lorin Picket M.D.   On: 06/24/2016 08:25   Dg Chest 1 View  Result Date: 06/27/2016 CLINICAL DATA:  Worsening shortness of breath. COPD. Former smoker. EXAM: CHEST 1 VIEW COMPARISON:  06/26/2016 FINDINGS: Heart size remains within normal limits.  Aortic atherosclerosis. Pulmonary interstitial prominence appears decreased compared to prior study. Mild persistent interstitial prominence has a coarse appearance, suspicious for chronic interstitial lung disease. Mild hyperinflation is unchanged and consistent with COPD. No evidence of pulmonary consolidation. No significant pleural effusion or pneumothorax. IMPRESSION: Decreased pulmonary interstitial prominence since previous study. No evidence of pulmonary consolidation. COPD. Aortic atherosclerosis. Electronically Signed   By: Earle Gell M.D.   On: 06/27/2016 10:45   Ct Head Wo Contrast  Result Date: 06/27/2016 CLINICAL DATA:  Blurred vision today in patient presenting with sepsis and cellulitis. EXAM: CT HEAD WITHOUT CONTRAST TECHNIQUE: Contiguous axial images were obtained from the base of the skull through the vertex without intravenous contrast. COMPARISON:  Brain MRI 12/27/2011. FINDINGS: Brain: Atrophy and extensive chronic microvascular ischemic change are seen. No evidence of acute intracranial abnormality including hemorrhage, infarct, mass lesion, mass effect, midline shift or abnormal extra-axial fluid collection.  No hydrocephalus or pneumocephalus. Vascular: Extensive atherosclerosis is seen. Skull: Intact. Sinuses/Orbits: Status post bilateral lens extraction. Otherwise negative. Other: None. IMPRESSION: No acute abnormality. Marked atrophy and extensive chronic microvascular ischemic change. Electronically Signed   By: Inge Rise M.D.   On: 06/27/2016 10:50   Dg Chest Port 1 View  Result Date: 06/26/2016 CLINICAL DATA:  Shortness of  breath. EXAM: PORTABLE CHEST 1 VIEW COMPARISON:  02/04/2012 . FINDINGS: Cardiomegaly with pulmonary vascular prominence and bilateral interstitial prominence consistent with congestive heart failure. Small bilateral pleural effusions. No pneumothorax . IMPRESSION: Congestive heart failure with pulmonary interstitial edema and small bilateral pleural effusions. Electronically Signed   By: Marcello Moores  Register   On: 06/26/2016 10:10    Lab Results: Basic Metabolic Panel:  Recent Labs  07/04/16 0514  NA 139  K 4.6  CL 102  CO2 28  GLUCOSE 127*  BUN 29*  CREATININE 1.61*  CALCIUM 7.8*   Liver Function Tests: No results for input(s): AST, ALT, ALKPHOS, BILITOT, PROT, ALBUMIN in the last 72 hours.   CBC: No results for input(s): WBC, NEUTROABS, HGB, HCT, MCV, PLT in the last 72 hours.  Recent Results (from the past 240 hour(s))  MRSA PCR Screening     Status: None   Collection Time: 06/30/16  2:13 AM  Result Value Ref Range Status   MRSA by PCR NEGATIVE NEGATIVE Final    Comment:        The GeneXpert MRSA Assay (FDA approved for NASAL specimens only), is one component of a comprehensive MRSA colonization surveillance program. It is not intended to diagnose MRSA infection nor to guide or monitor treatment for MRSA infections.      Hospital Course: This is a 76 year old who came to the emergency department because of rectal pain and accidental dislodgment of a chronic indwelling Foley catheter. At baseline he has Charcot-Marie-Tooth muscular dystrophy neurogenic bladder chronic Foley catheter hypertension COPD. When he was seen in the emergency room in addition to the change in his Foley catheter he had cellulitis of his right leg and was septic with a lactic acid of 2.54. Urinalysis showed many bacteria but was negative as far as culture was concerned. He was started on IV antibiotics and IV fluids for fluid resuscitation from sepsis. He developed volume overload and developed atrial  fibrillation rapid ventricular response. He also had COPD exacerbation during this hospitalization. His atrial fib was controlled with diltiazem and he was eventually well enough for transfer to skilled care facility for rehabilitation. He has had some confusion likely metabolic encephalopathy. His heart failure is controlled. His COPD exacerbation is better. He has hand Candida mucositis and that's improving. He had skin ulcerations and he has been using Aquacel foam twice a day after cleaning the ulcerations with saline the Aquacel foam is applied covered with gauze and Kerlix. He has been using Mycelex troches for his throat and that has improved and can be discontinued when his thrush is gone. He has finished a 10 day course of antibiotics and his cellulitis looks resolved  Discharge Exam: Blood pressure (!) 116/57, pulse (!) 58, temperature 98.8 F (37.1 C), temperature source Oral, resp. rate 20, height 5\' 7"  (1.702 m), weight 57.3 kg (126 lb 5.2 oz), SpO2 90 %. He's awake and alert. His chest is clear. He is still in atrial fib. He has skin lesions are being treated. Cellulitis is gone. His mouth still shows some coating with yeast  Disposition: To skilled care facility. He is also  using heel protectors at this time. He will be on dysphagia 3 diet with no added salt. Thin liquids. Oxygen at 2 L. He will be on Cardizem CD or LA 240 mg daily. Continue Foley catheter   Contact information for after-discharge care    [facility] .   Specialty:  [place] information: [address] [place] [phone]              Signed: Alonza Bogus   07/06/2016, 10:27 AM

## 2016-07-06 NOTE — Progress Notes (Signed)
Pt resting. Call light within reach. Nothing needed at this time.

## 2016-07-06 NOTE — Progress Notes (Signed)
Pt discharged to [facility] today per Dr. Luan Pulling. Pt's IV site D/C'd and WDL.  Pt's VSS. EMS provided with prescriptions, AVS and medical necessity. Wife updated via telephone. Report given to Sheran Lawless, nurse at [facility]. Verbalized understanding. Pt left floor via EMS stretcher in stable condition accompanied by EMT's.

## 2016-07-06 NOTE — Clinical Social Work Placement (Signed)
   CLINICAL SOCIAL WORK PLACEMENT  NOTE  Date:  07/06/2016  Patient Details  Name: Erik Atkins MRN: [id number] Date of Birth: Jul 06, 1940  Clinical Social Work is seeking post-discharge placement for this patient at the [place] level of care (*CSW will initial, date and re-position this form in  chart as items are completed):  Yes   Patient/family provided with [place] Work Department's list of facilities offering this level of care within the geographic area requested by the patient (or if unable, by the patient's family).  Yes   Patient/family informed of their freedom to choose among providers that offer the needed level of care, that participate in Medicare, Medicaid or managed care program needed by the patient, have an available bed and are willing to accept the patient.  Yes   Patient/family informed of [facility]'s ownership interest in [facility] and [facility], as well as of the fact that they are under no obligation to receive care at these facilities.  PASRR submitted to EDS on       PASRR number received on       Existing PASRR number confirmed on 07/02/16     FL2 transmitted to all facilities in geographic area requested by pt/family on 07/02/16     FL2 transmitted to all facilities within larger geographic area on       Patient informed that his/her managed care company has contracts with or will negotiate with certain facilities, including the following:        Yes   Patient/family informed of bed offers received.  Patient chooses bed at [facility]     Physician recommends and patient chooses bed at      Patient to be transferred to [facility] on 07/06/16.  Patient to be transferred to facility by Ambulance     Patient family notified on 07/06/16 of transfer.  Name of family member notified:  Charlett Nose     PHYSICIAN Please prepare priority discharge summary, including medications,  Please prepare prescriptions, Please sign FL2     Additional Comment:    _______________________________________________ Rigoberto Noel, LCSW 07/06/2016, 11:15 AM

## 2016-08-12 DIAGNOSIS — N319 Neuromuscular dysfunction of bladder, unspecified: Secondary | ICD-10-CM | POA: Diagnosis not present

## 2016-08-12 DIAGNOSIS — L97911 Non-pressure chronic ulcer of unspecified part of right lower leg limited to breakdown of skin: Secondary | ICD-10-CM | POA: Diagnosis not present

## 2016-08-12 DIAGNOSIS — Z7901 Long term (current) use of anticoagulants: Secondary | ICD-10-CM | POA: Diagnosis not present

## 2016-08-12 DIAGNOSIS — Z960 Presence of urogenital implants: Secondary | ICD-10-CM | POA: Diagnosis not present

## 2016-08-12 DIAGNOSIS — I872 Venous insufficiency (chronic) (peripheral): Secondary | ICD-10-CM | POA: Diagnosis not present

## 2016-08-12 DIAGNOSIS — L89622 Pressure ulcer of left heel, stage 2: Secondary | ICD-10-CM | POA: Diagnosis not present

## 2016-08-12 DIAGNOSIS — I509 Heart failure, unspecified: Secondary | ICD-10-CM | POA: Diagnosis not present

## 2016-08-12 DIAGNOSIS — I4891 Unspecified atrial fibrillation: Secondary | ICD-10-CM | POA: Diagnosis not present

## 2016-08-12 DIAGNOSIS — I110 Hypertensive heart disease with heart failure: Secondary | ICD-10-CM | POA: Diagnosis not present

## 2016-08-12 DIAGNOSIS — J449 Chronic obstructive pulmonary disease, unspecified: Secondary | ICD-10-CM | POA: Diagnosis not present

## 2016-08-13 DIAGNOSIS — I4891 Unspecified atrial fibrillation: Secondary | ICD-10-CM | POA: Diagnosis not present

## 2016-08-13 DIAGNOSIS — Z7901 Long term (current) use of anticoagulants: Secondary | ICD-10-CM | POA: Diagnosis not present

## 2016-08-13 DIAGNOSIS — I110 Hypertensive heart disease with heart failure: Secondary | ICD-10-CM | POA: Diagnosis not present

## 2016-08-13 DIAGNOSIS — Z960 Presence of urogenital implants: Secondary | ICD-10-CM | POA: Diagnosis not present

## 2016-08-13 DIAGNOSIS — L89622 Pressure ulcer of left heel, stage 2: Secondary | ICD-10-CM | POA: Diagnosis not present

## 2016-08-13 DIAGNOSIS — I509 Heart failure, unspecified: Secondary | ICD-10-CM | POA: Diagnosis not present

## 2016-08-13 DIAGNOSIS — I872 Venous insufficiency (chronic) (peripheral): Secondary | ICD-10-CM | POA: Diagnosis not present

## 2016-08-13 DIAGNOSIS — L97911 Non-pressure chronic ulcer of unspecified part of right lower leg limited to breakdown of skin: Secondary | ICD-10-CM | POA: Diagnosis not present

## 2016-08-13 DIAGNOSIS — J449 Chronic obstructive pulmonary disease, unspecified: Secondary | ICD-10-CM | POA: Diagnosis not present

## 2016-08-13 DIAGNOSIS — N319 Neuromuscular dysfunction of bladder, unspecified: Secondary | ICD-10-CM | POA: Diagnosis not present

## 2016-08-15 DIAGNOSIS — I509 Heart failure, unspecified: Secondary | ICD-10-CM | POA: Diagnosis not present

## 2016-08-15 DIAGNOSIS — Z7901 Long term (current) use of anticoagulants: Secondary | ICD-10-CM | POA: Diagnosis not present

## 2016-08-15 DIAGNOSIS — I4891 Unspecified atrial fibrillation: Secondary | ICD-10-CM | POA: Diagnosis not present

## 2016-08-15 DIAGNOSIS — I872 Venous insufficiency (chronic) (peripheral): Secondary | ICD-10-CM | POA: Diagnosis not present

## 2016-08-15 DIAGNOSIS — L89622 Pressure ulcer of left heel, stage 2: Secondary | ICD-10-CM | POA: Diagnosis not present

## 2016-08-15 DIAGNOSIS — L97911 Non-pressure chronic ulcer of unspecified part of right lower leg limited to breakdown of skin: Secondary | ICD-10-CM | POA: Diagnosis not present

## 2016-08-15 DIAGNOSIS — J449 Chronic obstructive pulmonary disease, unspecified: Secondary | ICD-10-CM | POA: Diagnosis not present

## 2016-08-15 DIAGNOSIS — N319 Neuromuscular dysfunction of bladder, unspecified: Secondary | ICD-10-CM | POA: Diagnosis not present

## 2016-08-15 DIAGNOSIS — Z960 Presence of urogenital implants: Secondary | ICD-10-CM | POA: Diagnosis not present

## 2016-08-15 DIAGNOSIS — I110 Hypertensive heart disease with heart failure: Secondary | ICD-10-CM | POA: Diagnosis not present

## 2016-08-19 DIAGNOSIS — N319 Neuromuscular dysfunction of bladder, unspecified: Secondary | ICD-10-CM | POA: Diagnosis not present

## 2016-08-19 DIAGNOSIS — Z7901 Long term (current) use of anticoagulants: Secondary | ICD-10-CM | POA: Diagnosis not present

## 2016-08-19 DIAGNOSIS — Z960 Presence of urogenital implants: Secondary | ICD-10-CM | POA: Diagnosis not present

## 2016-08-19 DIAGNOSIS — J449 Chronic obstructive pulmonary disease, unspecified: Secondary | ICD-10-CM | POA: Diagnosis not present

## 2016-08-19 DIAGNOSIS — I110 Hypertensive heart disease with heart failure: Secondary | ICD-10-CM | POA: Diagnosis not present

## 2016-08-19 DIAGNOSIS — I4891 Unspecified atrial fibrillation: Secondary | ICD-10-CM | POA: Diagnosis not present

## 2016-08-19 DIAGNOSIS — L97911 Non-pressure chronic ulcer of unspecified part of right lower leg limited to breakdown of skin: Secondary | ICD-10-CM | POA: Diagnosis not present

## 2016-08-19 DIAGNOSIS — I509 Heart failure, unspecified: Secondary | ICD-10-CM | POA: Diagnosis not present

## 2016-08-19 DIAGNOSIS — I872 Venous insufficiency (chronic) (peripheral): Secondary | ICD-10-CM | POA: Diagnosis not present

## 2016-08-19 DIAGNOSIS — L89622 Pressure ulcer of left heel, stage 2: Secondary | ICD-10-CM | POA: Diagnosis not present

## 2016-08-20 DIAGNOSIS — I110 Hypertensive heart disease with heart failure: Secondary | ICD-10-CM | POA: Diagnosis not present

## 2016-08-20 DIAGNOSIS — J449 Chronic obstructive pulmonary disease, unspecified: Secondary | ICD-10-CM | POA: Diagnosis not present

## 2016-08-20 DIAGNOSIS — Z960 Presence of urogenital implants: Secondary | ICD-10-CM | POA: Diagnosis not present

## 2016-08-20 DIAGNOSIS — I872 Venous insufficiency (chronic) (peripheral): Secondary | ICD-10-CM | POA: Diagnosis not present

## 2016-08-20 DIAGNOSIS — I509 Heart failure, unspecified: Secondary | ICD-10-CM | POA: Diagnosis not present

## 2016-08-20 DIAGNOSIS — M545 Low back pain: Secondary | ICD-10-CM | POA: Diagnosis not present

## 2016-08-20 DIAGNOSIS — L97911 Non-pressure chronic ulcer of unspecified part of right lower leg limited to breakdown of skin: Secondary | ICD-10-CM | POA: Diagnosis not present

## 2016-08-20 DIAGNOSIS — N319 Neuromuscular dysfunction of bladder, unspecified: Secondary | ICD-10-CM | POA: Diagnosis not present

## 2016-08-20 DIAGNOSIS — I10 Essential (primary) hypertension: Secondary | ICD-10-CM | POA: Diagnosis not present

## 2016-08-20 DIAGNOSIS — L89622 Pressure ulcer of left heel, stage 2: Secondary | ICD-10-CM | POA: Diagnosis not present

## 2016-08-20 DIAGNOSIS — Z7901 Long term (current) use of anticoagulants: Secondary | ICD-10-CM | POA: Diagnosis not present

## 2016-08-20 DIAGNOSIS — I4891 Unspecified atrial fibrillation: Secondary | ICD-10-CM | POA: Diagnosis not present

## 2016-08-21 DIAGNOSIS — I509 Heart failure, unspecified: Secondary | ICD-10-CM | POA: Diagnosis not present

## 2016-08-21 DIAGNOSIS — Z960 Presence of urogenital implants: Secondary | ICD-10-CM | POA: Diagnosis not present

## 2016-08-21 DIAGNOSIS — I872 Venous insufficiency (chronic) (peripheral): Secondary | ICD-10-CM | POA: Diagnosis not present

## 2016-08-21 DIAGNOSIS — L97911 Non-pressure chronic ulcer of unspecified part of right lower leg limited to breakdown of skin: Secondary | ICD-10-CM | POA: Diagnosis not present

## 2016-08-21 DIAGNOSIS — N319 Neuromuscular dysfunction of bladder, unspecified: Secondary | ICD-10-CM | POA: Diagnosis not present

## 2016-08-21 DIAGNOSIS — L89622 Pressure ulcer of left heel, stage 2: Secondary | ICD-10-CM | POA: Diagnosis not present

## 2016-08-21 DIAGNOSIS — I4891 Unspecified atrial fibrillation: Secondary | ICD-10-CM | POA: Diagnosis not present

## 2016-08-21 DIAGNOSIS — Z7901 Long term (current) use of anticoagulants: Secondary | ICD-10-CM | POA: Diagnosis not present

## 2016-08-21 DIAGNOSIS — J449 Chronic obstructive pulmonary disease, unspecified: Secondary | ICD-10-CM | POA: Diagnosis not present

## 2016-08-21 DIAGNOSIS — I110 Hypertensive heart disease with heart failure: Secondary | ICD-10-CM | POA: Diagnosis not present

## 2016-08-22 DIAGNOSIS — I4891 Unspecified atrial fibrillation: Secondary | ICD-10-CM | POA: Diagnosis not present

## 2016-08-22 DIAGNOSIS — N319 Neuromuscular dysfunction of bladder, unspecified: Secondary | ICD-10-CM | POA: Diagnosis not present

## 2016-08-22 DIAGNOSIS — Z960 Presence of urogenital implants: Secondary | ICD-10-CM | POA: Diagnosis not present

## 2016-08-22 DIAGNOSIS — Z7901 Long term (current) use of anticoagulants: Secondary | ICD-10-CM | POA: Diagnosis not present

## 2016-08-22 DIAGNOSIS — I872 Venous insufficiency (chronic) (peripheral): Secondary | ICD-10-CM | POA: Diagnosis not present

## 2016-08-22 DIAGNOSIS — L97911 Non-pressure chronic ulcer of unspecified part of right lower leg limited to breakdown of skin: Secondary | ICD-10-CM | POA: Diagnosis not present

## 2016-08-22 DIAGNOSIS — I110 Hypertensive heart disease with heart failure: Secondary | ICD-10-CM | POA: Diagnosis not present

## 2016-08-22 DIAGNOSIS — J449 Chronic obstructive pulmonary disease, unspecified: Secondary | ICD-10-CM | POA: Diagnosis not present

## 2016-08-22 DIAGNOSIS — I509 Heart failure, unspecified: Secondary | ICD-10-CM | POA: Diagnosis not present

## 2016-08-22 DIAGNOSIS — L89622 Pressure ulcer of left heel, stage 2: Secondary | ICD-10-CM | POA: Diagnosis not present

## 2016-08-23 DIAGNOSIS — L89622 Pressure ulcer of left heel, stage 2: Secondary | ICD-10-CM | POA: Diagnosis not present

## 2016-08-23 DIAGNOSIS — J449 Chronic obstructive pulmonary disease, unspecified: Secondary | ICD-10-CM | POA: Diagnosis not present

## 2016-08-23 DIAGNOSIS — I509 Heart failure, unspecified: Secondary | ICD-10-CM | POA: Diagnosis not present

## 2016-08-23 DIAGNOSIS — I872 Venous insufficiency (chronic) (peripheral): Secondary | ICD-10-CM | POA: Diagnosis not present

## 2016-08-23 DIAGNOSIS — L97911 Non-pressure chronic ulcer of unspecified part of right lower leg limited to breakdown of skin: Secondary | ICD-10-CM | POA: Diagnosis not present

## 2016-08-23 DIAGNOSIS — Z960 Presence of urogenital implants: Secondary | ICD-10-CM | POA: Diagnosis not present

## 2016-08-23 DIAGNOSIS — I110 Hypertensive heart disease with heart failure: Secondary | ICD-10-CM | POA: Diagnosis not present

## 2016-08-23 DIAGNOSIS — I4891 Unspecified atrial fibrillation: Secondary | ICD-10-CM | POA: Diagnosis not present

## 2016-08-23 DIAGNOSIS — N319 Neuromuscular dysfunction of bladder, unspecified: Secondary | ICD-10-CM | POA: Diagnosis not present

## 2016-08-23 DIAGNOSIS — Z7901 Long term (current) use of anticoagulants: Secondary | ICD-10-CM | POA: Diagnosis not present

## 2016-08-24 DIAGNOSIS — L97911 Non-pressure chronic ulcer of unspecified part of right lower leg limited to breakdown of skin: Secondary | ICD-10-CM | POA: Diagnosis not present

## 2016-08-24 DIAGNOSIS — I110 Hypertensive heart disease with heart failure: Secondary | ICD-10-CM | POA: Diagnosis not present

## 2016-08-24 DIAGNOSIS — I4891 Unspecified atrial fibrillation: Secondary | ICD-10-CM | POA: Diagnosis not present

## 2016-08-24 DIAGNOSIS — I509 Heart failure, unspecified: Secondary | ICD-10-CM | POA: Diagnosis not present

## 2016-08-24 DIAGNOSIS — L89622 Pressure ulcer of left heel, stage 2: Secondary | ICD-10-CM | POA: Diagnosis not present

## 2016-08-24 DIAGNOSIS — N319 Neuromuscular dysfunction of bladder, unspecified: Secondary | ICD-10-CM | POA: Diagnosis not present

## 2016-08-24 DIAGNOSIS — I872 Venous insufficiency (chronic) (peripheral): Secondary | ICD-10-CM | POA: Diagnosis not present

## 2016-08-24 DIAGNOSIS — Z960 Presence of urogenital implants: Secondary | ICD-10-CM | POA: Diagnosis not present

## 2016-08-24 DIAGNOSIS — J449 Chronic obstructive pulmonary disease, unspecified: Secondary | ICD-10-CM | POA: Diagnosis not present

## 2016-08-24 DIAGNOSIS — Z7901 Long term (current) use of anticoagulants: Secondary | ICD-10-CM | POA: Diagnosis not present

## 2016-08-26 DIAGNOSIS — L89622 Pressure ulcer of left heel, stage 2: Secondary | ICD-10-CM | POA: Diagnosis not present

## 2016-08-26 DIAGNOSIS — I4891 Unspecified atrial fibrillation: Secondary | ICD-10-CM | POA: Diagnosis not present

## 2016-08-26 DIAGNOSIS — Z960 Presence of urogenital implants: Secondary | ICD-10-CM | POA: Diagnosis not present

## 2016-08-26 DIAGNOSIS — I872 Venous insufficiency (chronic) (peripheral): Secondary | ICD-10-CM | POA: Diagnosis not present

## 2016-08-26 DIAGNOSIS — Z7901 Long term (current) use of anticoagulants: Secondary | ICD-10-CM | POA: Diagnosis not present

## 2016-08-26 DIAGNOSIS — I509 Heart failure, unspecified: Secondary | ICD-10-CM | POA: Diagnosis not present

## 2016-08-26 DIAGNOSIS — J449 Chronic obstructive pulmonary disease, unspecified: Secondary | ICD-10-CM | POA: Diagnosis not present

## 2016-08-26 DIAGNOSIS — I110 Hypertensive heart disease with heart failure: Secondary | ICD-10-CM | POA: Diagnosis not present

## 2016-08-26 DIAGNOSIS — N319 Neuromuscular dysfunction of bladder, unspecified: Secondary | ICD-10-CM | POA: Diagnosis not present

## 2016-08-26 DIAGNOSIS — L97911 Non-pressure chronic ulcer of unspecified part of right lower leg limited to breakdown of skin: Secondary | ICD-10-CM | POA: Diagnosis not present

## 2016-08-27 DIAGNOSIS — Z7901 Long term (current) use of anticoagulants: Secondary | ICD-10-CM | POA: Diagnosis not present

## 2016-08-27 DIAGNOSIS — I110 Hypertensive heart disease with heart failure: Secondary | ICD-10-CM | POA: Diagnosis not present

## 2016-08-27 DIAGNOSIS — I509 Heart failure, unspecified: Secondary | ICD-10-CM | POA: Diagnosis not present

## 2016-08-27 DIAGNOSIS — L89622 Pressure ulcer of left heel, stage 2: Secondary | ICD-10-CM | POA: Diagnosis not present

## 2016-08-27 DIAGNOSIS — N319 Neuromuscular dysfunction of bladder, unspecified: Secondary | ICD-10-CM | POA: Diagnosis not present

## 2016-08-27 DIAGNOSIS — J449 Chronic obstructive pulmonary disease, unspecified: Secondary | ICD-10-CM | POA: Diagnosis not present

## 2016-08-27 DIAGNOSIS — Z960 Presence of urogenital implants: Secondary | ICD-10-CM | POA: Diagnosis not present

## 2016-08-27 DIAGNOSIS — L97911 Non-pressure chronic ulcer of unspecified part of right lower leg limited to breakdown of skin: Secondary | ICD-10-CM | POA: Diagnosis not present

## 2016-08-27 DIAGNOSIS — I4891 Unspecified atrial fibrillation: Secondary | ICD-10-CM | POA: Diagnosis not present

## 2016-08-27 DIAGNOSIS — I872 Venous insufficiency (chronic) (peripheral): Secondary | ICD-10-CM | POA: Diagnosis not present

## 2016-08-29 DIAGNOSIS — Z7901 Long term (current) use of anticoagulants: Secondary | ICD-10-CM | POA: Diagnosis not present

## 2016-08-29 DIAGNOSIS — Z960 Presence of urogenital implants: Secondary | ICD-10-CM | POA: Diagnosis not present

## 2016-08-29 DIAGNOSIS — I872 Venous insufficiency (chronic) (peripheral): Secondary | ICD-10-CM | POA: Diagnosis not present

## 2016-08-29 DIAGNOSIS — L97911 Non-pressure chronic ulcer of unspecified part of right lower leg limited to breakdown of skin: Secondary | ICD-10-CM | POA: Diagnosis not present

## 2016-08-29 DIAGNOSIS — J449 Chronic obstructive pulmonary disease, unspecified: Secondary | ICD-10-CM | POA: Diagnosis not present

## 2016-08-29 DIAGNOSIS — N319 Neuromuscular dysfunction of bladder, unspecified: Secondary | ICD-10-CM | POA: Diagnosis not present

## 2016-08-29 DIAGNOSIS — L89622 Pressure ulcer of left heel, stage 2: Secondary | ICD-10-CM | POA: Diagnosis not present

## 2016-08-29 DIAGNOSIS — I4891 Unspecified atrial fibrillation: Secondary | ICD-10-CM | POA: Diagnosis not present

## 2016-08-29 DIAGNOSIS — I110 Hypertensive heart disease with heart failure: Secondary | ICD-10-CM | POA: Diagnosis not present

## 2016-08-29 DIAGNOSIS — I509 Heart failure, unspecified: Secondary | ICD-10-CM | POA: Diagnosis not present

## 2016-08-30 DIAGNOSIS — L89622 Pressure ulcer of left heel, stage 2: Secondary | ICD-10-CM | POA: Diagnosis not present

## 2016-08-30 DIAGNOSIS — J449 Chronic obstructive pulmonary disease, unspecified: Secondary | ICD-10-CM | POA: Diagnosis not present

## 2016-08-30 DIAGNOSIS — L97911 Non-pressure chronic ulcer of unspecified part of right lower leg limited to breakdown of skin: Secondary | ICD-10-CM | POA: Diagnosis not present

## 2016-08-30 DIAGNOSIS — I509 Heart failure, unspecified: Secondary | ICD-10-CM | POA: Diagnosis not present

## 2016-08-30 DIAGNOSIS — I4891 Unspecified atrial fibrillation: Secondary | ICD-10-CM | POA: Diagnosis not present

## 2016-08-30 DIAGNOSIS — I110 Hypertensive heart disease with heart failure: Secondary | ICD-10-CM | POA: Diagnosis not present

## 2016-08-30 DIAGNOSIS — Z960 Presence of urogenital implants: Secondary | ICD-10-CM | POA: Diagnosis not present

## 2016-08-30 DIAGNOSIS — N319 Neuromuscular dysfunction of bladder, unspecified: Secondary | ICD-10-CM | POA: Diagnosis not present

## 2016-08-30 DIAGNOSIS — Z7901 Long term (current) use of anticoagulants: Secondary | ICD-10-CM | POA: Diagnosis not present

## 2016-08-30 DIAGNOSIS — I872 Venous insufficiency (chronic) (peripheral): Secondary | ICD-10-CM | POA: Diagnosis not present

## 2016-09-02 DIAGNOSIS — I4891 Unspecified atrial fibrillation: Secondary | ICD-10-CM | POA: Diagnosis not present

## 2016-09-02 DIAGNOSIS — L89622 Pressure ulcer of left heel, stage 2: Secondary | ICD-10-CM | POA: Diagnosis not present

## 2016-09-02 DIAGNOSIS — I872 Venous insufficiency (chronic) (peripheral): Secondary | ICD-10-CM | POA: Diagnosis not present

## 2016-09-02 DIAGNOSIS — I110 Hypertensive heart disease with heart failure: Secondary | ICD-10-CM | POA: Diagnosis not present

## 2016-09-02 DIAGNOSIS — Z960 Presence of urogenital implants: Secondary | ICD-10-CM | POA: Diagnosis not present

## 2016-09-02 DIAGNOSIS — Z7901 Long term (current) use of anticoagulants: Secondary | ICD-10-CM | POA: Diagnosis not present

## 2016-09-02 DIAGNOSIS — L97911 Non-pressure chronic ulcer of unspecified part of right lower leg limited to breakdown of skin: Secondary | ICD-10-CM | POA: Diagnosis not present

## 2016-09-02 DIAGNOSIS — J449 Chronic obstructive pulmonary disease, unspecified: Secondary | ICD-10-CM | POA: Diagnosis not present

## 2016-09-02 DIAGNOSIS — N319 Neuromuscular dysfunction of bladder, unspecified: Secondary | ICD-10-CM | POA: Diagnosis not present

## 2016-09-02 DIAGNOSIS — I509 Heart failure, unspecified: Secondary | ICD-10-CM | POA: Diagnosis not present

## 2016-09-05 DIAGNOSIS — Z7901 Long term (current) use of anticoagulants: Secondary | ICD-10-CM | POA: Diagnosis not present

## 2016-09-05 DIAGNOSIS — N319 Neuromuscular dysfunction of bladder, unspecified: Secondary | ICD-10-CM | POA: Diagnosis not present

## 2016-09-05 DIAGNOSIS — L97911 Non-pressure chronic ulcer of unspecified part of right lower leg limited to breakdown of skin: Secondary | ICD-10-CM | POA: Diagnosis not present

## 2016-09-05 DIAGNOSIS — Z960 Presence of urogenital implants: Secondary | ICD-10-CM | POA: Diagnosis not present

## 2016-09-05 DIAGNOSIS — I872 Venous insufficiency (chronic) (peripheral): Secondary | ICD-10-CM | POA: Diagnosis not present

## 2016-09-05 DIAGNOSIS — I4891 Unspecified atrial fibrillation: Secondary | ICD-10-CM | POA: Diagnosis not present

## 2016-09-05 DIAGNOSIS — J449 Chronic obstructive pulmonary disease, unspecified: Secondary | ICD-10-CM | POA: Diagnosis not present

## 2016-09-05 DIAGNOSIS — I110 Hypertensive heart disease with heart failure: Secondary | ICD-10-CM | POA: Diagnosis not present

## 2016-09-05 DIAGNOSIS — L89622 Pressure ulcer of left heel, stage 2: Secondary | ICD-10-CM | POA: Diagnosis not present

## 2016-09-05 DIAGNOSIS — I509 Heart failure, unspecified: Secondary | ICD-10-CM | POA: Diagnosis not present

## 2016-09-06 DIAGNOSIS — Z7901 Long term (current) use of anticoagulants: Secondary | ICD-10-CM | POA: Diagnosis not present

## 2016-09-06 DIAGNOSIS — I4891 Unspecified atrial fibrillation: Secondary | ICD-10-CM | POA: Diagnosis not present

## 2016-09-06 DIAGNOSIS — Z960 Presence of urogenital implants: Secondary | ICD-10-CM | POA: Diagnosis not present

## 2016-09-06 DIAGNOSIS — L89622 Pressure ulcer of left heel, stage 2: Secondary | ICD-10-CM | POA: Diagnosis not present

## 2016-09-06 DIAGNOSIS — I509 Heart failure, unspecified: Secondary | ICD-10-CM | POA: Diagnosis not present

## 2016-09-06 DIAGNOSIS — N319 Neuromuscular dysfunction of bladder, unspecified: Secondary | ICD-10-CM | POA: Diagnosis not present

## 2016-09-06 DIAGNOSIS — J449 Chronic obstructive pulmonary disease, unspecified: Secondary | ICD-10-CM | POA: Diagnosis not present

## 2016-09-06 DIAGNOSIS — I110 Hypertensive heart disease with heart failure: Secondary | ICD-10-CM | POA: Diagnosis not present

## 2016-09-06 DIAGNOSIS — I872 Venous insufficiency (chronic) (peripheral): Secondary | ICD-10-CM | POA: Diagnosis not present

## 2016-09-06 DIAGNOSIS — L97911 Non-pressure chronic ulcer of unspecified part of right lower leg limited to breakdown of skin: Secondary | ICD-10-CM | POA: Diagnosis not present

## 2016-09-09 DIAGNOSIS — I4891 Unspecified atrial fibrillation: Secondary | ICD-10-CM | POA: Diagnosis not present

## 2016-09-09 DIAGNOSIS — I110 Hypertensive heart disease with heart failure: Secondary | ICD-10-CM | POA: Diagnosis not present

## 2016-09-09 DIAGNOSIS — J449 Chronic obstructive pulmonary disease, unspecified: Secondary | ICD-10-CM | POA: Diagnosis not present

## 2016-09-09 DIAGNOSIS — Z7901 Long term (current) use of anticoagulants: Secondary | ICD-10-CM | POA: Diagnosis not present

## 2016-09-09 DIAGNOSIS — Z960 Presence of urogenital implants: Secondary | ICD-10-CM | POA: Diagnosis not present

## 2016-09-09 DIAGNOSIS — L97911 Non-pressure chronic ulcer of unspecified part of right lower leg limited to breakdown of skin: Secondary | ICD-10-CM | POA: Diagnosis not present

## 2016-09-09 DIAGNOSIS — L89622 Pressure ulcer of left heel, stage 2: Secondary | ICD-10-CM | POA: Diagnosis not present

## 2016-09-09 DIAGNOSIS — I872 Venous insufficiency (chronic) (peripheral): Secondary | ICD-10-CM | POA: Diagnosis not present

## 2016-09-09 DIAGNOSIS — I509 Heart failure, unspecified: Secondary | ICD-10-CM | POA: Diagnosis not present

## 2016-09-09 DIAGNOSIS — N319 Neuromuscular dysfunction of bladder, unspecified: Secondary | ICD-10-CM | POA: Diagnosis not present

## 2016-09-10 DIAGNOSIS — I509 Heart failure, unspecified: Secondary | ICD-10-CM | POA: Diagnosis not present

## 2016-09-10 DIAGNOSIS — L97911 Non-pressure chronic ulcer of unspecified part of right lower leg limited to breakdown of skin: Secondary | ICD-10-CM | POA: Diagnosis not present

## 2016-09-10 DIAGNOSIS — I110 Hypertensive heart disease with heart failure: Secondary | ICD-10-CM | POA: Diagnosis not present

## 2016-09-10 DIAGNOSIS — I4891 Unspecified atrial fibrillation: Secondary | ICD-10-CM | POA: Diagnosis not present

## 2016-09-10 DIAGNOSIS — I872 Venous insufficiency (chronic) (peripheral): Secondary | ICD-10-CM | POA: Diagnosis not present

## 2016-09-10 DIAGNOSIS — Z960 Presence of urogenital implants: Secondary | ICD-10-CM | POA: Diagnosis not present

## 2016-09-10 DIAGNOSIS — J449 Chronic obstructive pulmonary disease, unspecified: Secondary | ICD-10-CM | POA: Diagnosis not present

## 2016-09-10 DIAGNOSIS — N319 Neuromuscular dysfunction of bladder, unspecified: Secondary | ICD-10-CM | POA: Diagnosis not present

## 2016-09-10 DIAGNOSIS — Z7901 Long term (current) use of anticoagulants: Secondary | ICD-10-CM | POA: Diagnosis not present

## 2016-09-10 DIAGNOSIS — L89622 Pressure ulcer of left heel, stage 2: Secondary | ICD-10-CM | POA: Diagnosis not present

## 2016-09-12 DIAGNOSIS — N319 Neuromuscular dysfunction of bladder, unspecified: Secondary | ICD-10-CM | POA: Diagnosis not present

## 2016-09-12 DIAGNOSIS — I110 Hypertensive heart disease with heart failure: Secondary | ICD-10-CM | POA: Diagnosis not present

## 2016-09-12 DIAGNOSIS — I509 Heart failure, unspecified: Secondary | ICD-10-CM | POA: Diagnosis not present

## 2016-09-12 DIAGNOSIS — L89622 Pressure ulcer of left heel, stage 2: Secondary | ICD-10-CM | POA: Diagnosis not present

## 2016-09-12 DIAGNOSIS — L97911 Non-pressure chronic ulcer of unspecified part of right lower leg limited to breakdown of skin: Secondary | ICD-10-CM | POA: Diagnosis not present

## 2016-09-12 DIAGNOSIS — Z7901 Long term (current) use of anticoagulants: Secondary | ICD-10-CM | POA: Diagnosis not present

## 2016-09-12 DIAGNOSIS — J449 Chronic obstructive pulmonary disease, unspecified: Secondary | ICD-10-CM | POA: Diagnosis not present

## 2016-09-12 DIAGNOSIS — I4891 Unspecified atrial fibrillation: Secondary | ICD-10-CM | POA: Diagnosis not present

## 2016-09-12 DIAGNOSIS — Z960 Presence of urogenital implants: Secondary | ICD-10-CM | POA: Diagnosis not present

## 2016-09-12 DIAGNOSIS — I872 Venous insufficiency (chronic) (peripheral): Secondary | ICD-10-CM | POA: Diagnosis not present

## 2016-09-13 DIAGNOSIS — I872 Venous insufficiency (chronic) (peripheral): Secondary | ICD-10-CM | POA: Diagnosis not present

## 2016-09-13 DIAGNOSIS — L89622 Pressure ulcer of left heel, stage 2: Secondary | ICD-10-CM | POA: Diagnosis not present

## 2016-09-13 DIAGNOSIS — Z960 Presence of urogenital implants: Secondary | ICD-10-CM | POA: Diagnosis not present

## 2016-09-13 DIAGNOSIS — Z7901 Long term (current) use of anticoagulants: Secondary | ICD-10-CM | POA: Diagnosis not present

## 2016-09-13 DIAGNOSIS — I509 Heart failure, unspecified: Secondary | ICD-10-CM | POA: Diagnosis not present

## 2016-09-13 DIAGNOSIS — L97911 Non-pressure chronic ulcer of unspecified part of right lower leg limited to breakdown of skin: Secondary | ICD-10-CM | POA: Diagnosis not present

## 2016-09-13 DIAGNOSIS — J449 Chronic obstructive pulmonary disease, unspecified: Secondary | ICD-10-CM | POA: Diagnosis not present

## 2016-09-13 DIAGNOSIS — I4891 Unspecified atrial fibrillation: Secondary | ICD-10-CM | POA: Diagnosis not present

## 2016-09-13 DIAGNOSIS — I110 Hypertensive heart disease with heart failure: Secondary | ICD-10-CM | POA: Diagnosis not present

## 2016-09-13 DIAGNOSIS — N319 Neuromuscular dysfunction of bladder, unspecified: Secondary | ICD-10-CM | POA: Diagnosis not present

## 2016-09-16 DIAGNOSIS — Z960 Presence of urogenital implants: Secondary | ICD-10-CM | POA: Diagnosis not present

## 2016-09-16 DIAGNOSIS — J449 Chronic obstructive pulmonary disease, unspecified: Secondary | ICD-10-CM | POA: Diagnosis not present

## 2016-09-16 DIAGNOSIS — I509 Heart failure, unspecified: Secondary | ICD-10-CM | POA: Diagnosis not present

## 2016-09-16 DIAGNOSIS — L89622 Pressure ulcer of left heel, stage 2: Secondary | ICD-10-CM | POA: Diagnosis not present

## 2016-09-16 DIAGNOSIS — Z7901 Long term (current) use of anticoagulants: Secondary | ICD-10-CM | POA: Diagnosis not present

## 2016-09-16 DIAGNOSIS — I110 Hypertensive heart disease with heart failure: Secondary | ICD-10-CM | POA: Diagnosis not present

## 2016-09-16 DIAGNOSIS — N319 Neuromuscular dysfunction of bladder, unspecified: Secondary | ICD-10-CM | POA: Diagnosis not present

## 2016-09-16 DIAGNOSIS — I4891 Unspecified atrial fibrillation: Secondary | ICD-10-CM | POA: Diagnosis not present

## 2016-09-16 DIAGNOSIS — I872 Venous insufficiency (chronic) (peripheral): Secondary | ICD-10-CM | POA: Diagnosis not present

## 2016-09-16 DIAGNOSIS — L97911 Non-pressure chronic ulcer of unspecified part of right lower leg limited to breakdown of skin: Secondary | ICD-10-CM | POA: Diagnosis not present

## 2016-09-17 ENCOUNTER — Ambulatory Visit: Payer: Medicare Other | Admitting: Urology

## 2016-09-18 DIAGNOSIS — I509 Heart failure, unspecified: Secondary | ICD-10-CM | POA: Diagnosis not present

## 2016-09-18 DIAGNOSIS — I4891 Unspecified atrial fibrillation: Secondary | ICD-10-CM | POA: Diagnosis not present

## 2016-09-18 DIAGNOSIS — Z960 Presence of urogenital implants: Secondary | ICD-10-CM | POA: Diagnosis not present

## 2016-09-18 DIAGNOSIS — N319 Neuromuscular dysfunction of bladder, unspecified: Secondary | ICD-10-CM | POA: Diagnosis not present

## 2016-09-18 DIAGNOSIS — J449 Chronic obstructive pulmonary disease, unspecified: Secondary | ICD-10-CM | POA: Diagnosis not present

## 2016-09-18 DIAGNOSIS — Z7901 Long term (current) use of anticoagulants: Secondary | ICD-10-CM | POA: Diagnosis not present

## 2016-09-18 DIAGNOSIS — L97911 Non-pressure chronic ulcer of unspecified part of right lower leg limited to breakdown of skin: Secondary | ICD-10-CM | POA: Diagnosis not present

## 2016-09-18 DIAGNOSIS — I110 Hypertensive heart disease with heart failure: Secondary | ICD-10-CM | POA: Diagnosis not present

## 2016-09-18 DIAGNOSIS — I872 Venous insufficiency (chronic) (peripheral): Secondary | ICD-10-CM | POA: Diagnosis not present

## 2016-09-18 DIAGNOSIS — L89622 Pressure ulcer of left heel, stage 2: Secondary | ICD-10-CM | POA: Diagnosis not present

## 2016-09-19 DIAGNOSIS — J449 Chronic obstructive pulmonary disease, unspecified: Secondary | ICD-10-CM | POA: Diagnosis not present

## 2016-09-19 DIAGNOSIS — I10 Essential (primary) hypertension: Secondary | ICD-10-CM | POA: Diagnosis not present

## 2016-09-19 DIAGNOSIS — I4891 Unspecified atrial fibrillation: Secondary | ICD-10-CM | POA: Diagnosis not present

## 2016-09-21 DIAGNOSIS — L97911 Non-pressure chronic ulcer of unspecified part of right lower leg limited to breakdown of skin: Secondary | ICD-10-CM | POA: Diagnosis not present

## 2016-09-21 DIAGNOSIS — Z7901 Long term (current) use of anticoagulants: Secondary | ICD-10-CM | POA: Diagnosis not present

## 2016-09-21 DIAGNOSIS — Z960 Presence of urogenital implants: Secondary | ICD-10-CM | POA: Diagnosis not present

## 2016-09-21 DIAGNOSIS — I509 Heart failure, unspecified: Secondary | ICD-10-CM | POA: Diagnosis not present

## 2016-09-21 DIAGNOSIS — J449 Chronic obstructive pulmonary disease, unspecified: Secondary | ICD-10-CM | POA: Diagnosis not present

## 2016-09-21 DIAGNOSIS — N319 Neuromuscular dysfunction of bladder, unspecified: Secondary | ICD-10-CM | POA: Diagnosis not present

## 2016-09-21 DIAGNOSIS — L89622 Pressure ulcer of left heel, stage 2: Secondary | ICD-10-CM | POA: Diagnosis not present

## 2016-09-21 DIAGNOSIS — I4891 Unspecified atrial fibrillation: Secondary | ICD-10-CM | POA: Diagnosis not present

## 2016-09-21 DIAGNOSIS — I872 Venous insufficiency (chronic) (peripheral): Secondary | ICD-10-CM | POA: Diagnosis not present

## 2016-09-21 DIAGNOSIS — I110 Hypertensive heart disease with heart failure: Secondary | ICD-10-CM | POA: Diagnosis not present

## 2016-09-23 DIAGNOSIS — L89622 Pressure ulcer of left heel, stage 2: Secondary | ICD-10-CM | POA: Diagnosis not present

## 2016-09-23 DIAGNOSIS — L97911 Non-pressure chronic ulcer of unspecified part of right lower leg limited to breakdown of skin: Secondary | ICD-10-CM | POA: Diagnosis not present

## 2016-09-23 DIAGNOSIS — I110 Hypertensive heart disease with heart failure: Secondary | ICD-10-CM | POA: Diagnosis not present

## 2016-09-23 DIAGNOSIS — I509 Heart failure, unspecified: Secondary | ICD-10-CM | POA: Diagnosis not present

## 2016-09-23 DIAGNOSIS — I872 Venous insufficiency (chronic) (peripheral): Secondary | ICD-10-CM | POA: Diagnosis not present

## 2016-09-23 DIAGNOSIS — N319 Neuromuscular dysfunction of bladder, unspecified: Secondary | ICD-10-CM | POA: Diagnosis not present

## 2016-09-23 DIAGNOSIS — J449 Chronic obstructive pulmonary disease, unspecified: Secondary | ICD-10-CM | POA: Diagnosis not present

## 2016-09-23 DIAGNOSIS — Z7901 Long term (current) use of anticoagulants: Secondary | ICD-10-CM | POA: Diagnosis not present

## 2016-09-23 DIAGNOSIS — I4891 Unspecified atrial fibrillation: Secondary | ICD-10-CM | POA: Diagnosis not present

## 2016-09-23 DIAGNOSIS — Z960 Presence of urogenital implants: Secondary | ICD-10-CM | POA: Diagnosis not present

## 2016-09-25 DIAGNOSIS — L89312 Pressure ulcer of right buttock, stage 2: Secondary | ICD-10-CM | POA: Diagnosis not present

## 2016-09-25 DIAGNOSIS — N319 Neuromuscular dysfunction of bladder, unspecified: Secondary | ICD-10-CM | POA: Diagnosis not present

## 2016-09-25 DIAGNOSIS — I872 Venous insufficiency (chronic) (peripheral): Secondary | ICD-10-CM | POA: Diagnosis not present

## 2016-09-25 DIAGNOSIS — Z960 Presence of urogenital implants: Secondary | ICD-10-CM | POA: Diagnosis not present

## 2016-09-25 DIAGNOSIS — I509 Heart failure, unspecified: Secondary | ICD-10-CM | POA: Diagnosis not present

## 2016-09-25 DIAGNOSIS — I4891 Unspecified atrial fibrillation: Secondary | ICD-10-CM | POA: Diagnosis not present

## 2016-09-25 DIAGNOSIS — I110 Hypertensive heart disease with heart failure: Secondary | ICD-10-CM | POA: Diagnosis not present

## 2016-09-25 DIAGNOSIS — Z7901 Long term (current) use of anticoagulants: Secondary | ICD-10-CM | POA: Diagnosis not present

## 2016-09-25 DIAGNOSIS — J449 Chronic obstructive pulmonary disease, unspecified: Secondary | ICD-10-CM | POA: Diagnosis not present

## 2016-09-26 DIAGNOSIS — I110 Hypertensive heart disease with heart failure: Secondary | ICD-10-CM | POA: Diagnosis not present

## 2016-09-26 DIAGNOSIS — I872 Venous insufficiency (chronic) (peripheral): Secondary | ICD-10-CM | POA: Diagnosis not present

## 2016-09-26 DIAGNOSIS — N319 Neuromuscular dysfunction of bladder, unspecified: Secondary | ICD-10-CM | POA: Diagnosis not present

## 2016-09-26 DIAGNOSIS — L89312 Pressure ulcer of right buttock, stage 2: Secondary | ICD-10-CM | POA: Diagnosis not present

## 2016-09-26 DIAGNOSIS — Z960 Presence of urogenital implants: Secondary | ICD-10-CM | POA: Diagnosis not present

## 2016-09-26 DIAGNOSIS — I4891 Unspecified atrial fibrillation: Secondary | ICD-10-CM | POA: Diagnosis not present

## 2016-09-26 DIAGNOSIS — I509 Heart failure, unspecified: Secondary | ICD-10-CM | POA: Diagnosis not present

## 2016-09-26 DIAGNOSIS — J449 Chronic obstructive pulmonary disease, unspecified: Secondary | ICD-10-CM | POA: Diagnosis not present

## 2016-09-26 DIAGNOSIS — Z7901 Long term (current) use of anticoagulants: Secondary | ICD-10-CM | POA: Diagnosis not present

## 2016-09-27 DIAGNOSIS — Z7901 Long term (current) use of anticoagulants: Secondary | ICD-10-CM | POA: Diagnosis not present

## 2016-09-27 DIAGNOSIS — I509 Heart failure, unspecified: Secondary | ICD-10-CM | POA: Diagnosis not present

## 2016-09-27 DIAGNOSIS — J449 Chronic obstructive pulmonary disease, unspecified: Secondary | ICD-10-CM | POA: Diagnosis not present

## 2016-09-27 DIAGNOSIS — Z960 Presence of urogenital implants: Secondary | ICD-10-CM | POA: Diagnosis not present

## 2016-09-27 DIAGNOSIS — L89312 Pressure ulcer of right buttock, stage 2: Secondary | ICD-10-CM | POA: Diagnosis not present

## 2016-09-27 DIAGNOSIS — I4891 Unspecified atrial fibrillation: Secondary | ICD-10-CM | POA: Diagnosis not present

## 2016-09-27 DIAGNOSIS — N319 Neuromuscular dysfunction of bladder, unspecified: Secondary | ICD-10-CM | POA: Diagnosis not present

## 2016-09-27 DIAGNOSIS — I110 Hypertensive heart disease with heart failure: Secondary | ICD-10-CM | POA: Diagnosis not present

## 2016-09-27 DIAGNOSIS — I872 Venous insufficiency (chronic) (peripheral): Secondary | ICD-10-CM | POA: Diagnosis not present

## 2016-10-02 DIAGNOSIS — I4891 Unspecified atrial fibrillation: Secondary | ICD-10-CM | POA: Diagnosis not present

## 2016-10-02 DIAGNOSIS — I110 Hypertensive heart disease with heart failure: Secondary | ICD-10-CM | POA: Diagnosis not present

## 2016-10-02 DIAGNOSIS — L89312 Pressure ulcer of right buttock, stage 2: Secondary | ICD-10-CM | POA: Diagnosis not present

## 2016-10-02 DIAGNOSIS — Z960 Presence of urogenital implants: Secondary | ICD-10-CM | POA: Diagnosis not present

## 2016-10-02 DIAGNOSIS — J449 Chronic obstructive pulmonary disease, unspecified: Secondary | ICD-10-CM | POA: Diagnosis not present

## 2016-10-02 DIAGNOSIS — I872 Venous insufficiency (chronic) (peripheral): Secondary | ICD-10-CM | POA: Diagnosis not present

## 2016-10-02 DIAGNOSIS — N319 Neuromuscular dysfunction of bladder, unspecified: Secondary | ICD-10-CM | POA: Diagnosis not present

## 2016-10-02 DIAGNOSIS — I509 Heart failure, unspecified: Secondary | ICD-10-CM | POA: Diagnosis not present

## 2016-10-02 DIAGNOSIS — Z7901 Long term (current) use of anticoagulants: Secondary | ICD-10-CM | POA: Diagnosis not present

## 2016-10-04 DIAGNOSIS — I872 Venous insufficiency (chronic) (peripheral): Secondary | ICD-10-CM | POA: Diagnosis not present

## 2016-10-04 DIAGNOSIS — Z7901 Long term (current) use of anticoagulants: Secondary | ICD-10-CM | POA: Diagnosis not present

## 2016-10-04 DIAGNOSIS — L89312 Pressure ulcer of right buttock, stage 2: Secondary | ICD-10-CM | POA: Diagnosis not present

## 2016-10-04 DIAGNOSIS — J449 Chronic obstructive pulmonary disease, unspecified: Secondary | ICD-10-CM | POA: Diagnosis not present

## 2016-10-04 DIAGNOSIS — I110 Hypertensive heart disease with heart failure: Secondary | ICD-10-CM | POA: Diagnosis not present

## 2016-10-04 DIAGNOSIS — I4891 Unspecified atrial fibrillation: Secondary | ICD-10-CM | POA: Diagnosis not present

## 2016-10-04 DIAGNOSIS — I509 Heart failure, unspecified: Secondary | ICD-10-CM | POA: Diagnosis not present

## 2016-10-04 DIAGNOSIS — Z960 Presence of urogenital implants: Secondary | ICD-10-CM | POA: Diagnosis not present

## 2016-10-04 DIAGNOSIS — N319 Neuromuscular dysfunction of bladder, unspecified: Secondary | ICD-10-CM | POA: Diagnosis not present

## 2016-10-07 DIAGNOSIS — I509 Heart failure, unspecified: Secondary | ICD-10-CM | POA: Diagnosis not present

## 2016-10-07 DIAGNOSIS — L89312 Pressure ulcer of right buttock, stage 2: Secondary | ICD-10-CM | POA: Diagnosis not present

## 2016-10-07 DIAGNOSIS — Z960 Presence of urogenital implants: Secondary | ICD-10-CM | POA: Diagnosis not present

## 2016-10-07 DIAGNOSIS — I4891 Unspecified atrial fibrillation: Secondary | ICD-10-CM | POA: Diagnosis not present

## 2016-10-07 DIAGNOSIS — I110 Hypertensive heart disease with heart failure: Secondary | ICD-10-CM | POA: Diagnosis not present

## 2016-10-07 DIAGNOSIS — N319 Neuromuscular dysfunction of bladder, unspecified: Secondary | ICD-10-CM | POA: Diagnosis not present

## 2016-10-07 DIAGNOSIS — Z7901 Long term (current) use of anticoagulants: Secondary | ICD-10-CM | POA: Diagnosis not present

## 2016-10-07 DIAGNOSIS — I872 Venous insufficiency (chronic) (peripheral): Secondary | ICD-10-CM | POA: Diagnosis not present

## 2016-10-07 DIAGNOSIS — J449 Chronic obstructive pulmonary disease, unspecified: Secondary | ICD-10-CM | POA: Diagnosis not present

## 2016-10-09 DIAGNOSIS — I509 Heart failure, unspecified: Secondary | ICD-10-CM | POA: Diagnosis not present

## 2016-10-09 DIAGNOSIS — I872 Venous insufficiency (chronic) (peripheral): Secondary | ICD-10-CM | POA: Diagnosis not present

## 2016-10-09 DIAGNOSIS — J449 Chronic obstructive pulmonary disease, unspecified: Secondary | ICD-10-CM | POA: Diagnosis not present

## 2016-10-09 DIAGNOSIS — N319 Neuromuscular dysfunction of bladder, unspecified: Secondary | ICD-10-CM | POA: Diagnosis not present

## 2016-10-09 DIAGNOSIS — I4891 Unspecified atrial fibrillation: Secondary | ICD-10-CM | POA: Diagnosis not present

## 2016-10-09 DIAGNOSIS — Z7901 Long term (current) use of anticoagulants: Secondary | ICD-10-CM | POA: Diagnosis not present

## 2016-10-09 DIAGNOSIS — L89312 Pressure ulcer of right buttock, stage 2: Secondary | ICD-10-CM | POA: Diagnosis not present

## 2016-10-09 DIAGNOSIS — Z960 Presence of urogenital implants: Secondary | ICD-10-CM | POA: Diagnosis not present

## 2016-10-09 DIAGNOSIS — I110 Hypertensive heart disease with heart failure: Secondary | ICD-10-CM | POA: Diagnosis not present

## 2016-10-11 DIAGNOSIS — L89312 Pressure ulcer of right buttock, stage 2: Secondary | ICD-10-CM | POA: Diagnosis not present

## 2016-10-11 DIAGNOSIS — I872 Venous insufficiency (chronic) (peripheral): Secondary | ICD-10-CM | POA: Diagnosis not present

## 2016-10-11 DIAGNOSIS — J449 Chronic obstructive pulmonary disease, unspecified: Secondary | ICD-10-CM | POA: Diagnosis not present

## 2016-10-14 DIAGNOSIS — I4891 Unspecified atrial fibrillation: Secondary | ICD-10-CM | POA: Diagnosis not present

## 2016-10-14 DIAGNOSIS — I509 Heart failure, unspecified: Secondary | ICD-10-CM | POA: Diagnosis not present

## 2016-10-14 DIAGNOSIS — J449 Chronic obstructive pulmonary disease, unspecified: Secondary | ICD-10-CM | POA: Diagnosis not present

## 2016-10-14 DIAGNOSIS — I110 Hypertensive heart disease with heart failure: Secondary | ICD-10-CM | POA: Diagnosis not present

## 2016-10-14 DIAGNOSIS — N319 Neuromuscular dysfunction of bladder, unspecified: Secondary | ICD-10-CM | POA: Diagnosis not present

## 2016-10-14 DIAGNOSIS — Z7901 Long term (current) use of anticoagulants: Secondary | ICD-10-CM | POA: Diagnosis not present

## 2016-10-14 DIAGNOSIS — I872 Venous insufficiency (chronic) (peripheral): Secondary | ICD-10-CM | POA: Diagnosis not present

## 2016-10-14 DIAGNOSIS — L89312 Pressure ulcer of right buttock, stage 2: Secondary | ICD-10-CM | POA: Diagnosis not present

## 2016-10-14 DIAGNOSIS — Z960 Presence of urogenital implants: Secondary | ICD-10-CM | POA: Diagnosis not present

## 2016-10-15 DIAGNOSIS — L89312 Pressure ulcer of right buttock, stage 2: Secondary | ICD-10-CM | POA: Diagnosis not present

## 2016-10-15 DIAGNOSIS — N319 Neuromuscular dysfunction of bladder, unspecified: Secondary | ICD-10-CM | POA: Diagnosis not present

## 2016-10-15 DIAGNOSIS — R339 Retention of urine, unspecified: Secondary | ICD-10-CM | POA: Diagnosis not present

## 2016-10-17 DIAGNOSIS — I110 Hypertensive heart disease with heart failure: Secondary | ICD-10-CM | POA: Diagnosis not present

## 2016-10-17 DIAGNOSIS — N319 Neuromuscular dysfunction of bladder, unspecified: Secondary | ICD-10-CM | POA: Diagnosis not present

## 2016-10-17 DIAGNOSIS — Z960 Presence of urogenital implants: Secondary | ICD-10-CM | POA: Diagnosis not present

## 2016-10-17 DIAGNOSIS — J449 Chronic obstructive pulmonary disease, unspecified: Secondary | ICD-10-CM | POA: Diagnosis not present

## 2016-10-17 DIAGNOSIS — L89312 Pressure ulcer of right buttock, stage 2: Secondary | ICD-10-CM | POA: Diagnosis not present

## 2016-10-17 DIAGNOSIS — Z7901 Long term (current) use of anticoagulants: Secondary | ICD-10-CM | POA: Diagnosis not present

## 2016-10-17 DIAGNOSIS — I4891 Unspecified atrial fibrillation: Secondary | ICD-10-CM | POA: Diagnosis not present

## 2016-10-17 DIAGNOSIS — I509 Heart failure, unspecified: Secondary | ICD-10-CM | POA: Diagnosis not present

## 2016-10-17 DIAGNOSIS — I872 Venous insufficiency (chronic) (peripheral): Secondary | ICD-10-CM | POA: Diagnosis not present

## 2016-10-22 DIAGNOSIS — L89312 Pressure ulcer of right buttock, stage 2: Secondary | ICD-10-CM | POA: Diagnosis not present

## 2016-10-22 DIAGNOSIS — I110 Hypertensive heart disease with heart failure: Secondary | ICD-10-CM | POA: Diagnosis not present

## 2016-10-22 DIAGNOSIS — N319 Neuromuscular dysfunction of bladder, unspecified: Secondary | ICD-10-CM | POA: Diagnosis not present

## 2016-10-22 DIAGNOSIS — J449 Chronic obstructive pulmonary disease, unspecified: Secondary | ICD-10-CM | POA: Diagnosis not present

## 2016-10-22 DIAGNOSIS — I4891 Unspecified atrial fibrillation: Secondary | ICD-10-CM | POA: Diagnosis not present

## 2016-10-22 DIAGNOSIS — I509 Heart failure, unspecified: Secondary | ICD-10-CM | POA: Diagnosis not present

## 2016-10-22 DIAGNOSIS — Z7901 Long term (current) use of anticoagulants: Secondary | ICD-10-CM | POA: Diagnosis not present

## 2016-10-22 DIAGNOSIS — Z960 Presence of urogenital implants: Secondary | ICD-10-CM | POA: Diagnosis not present

## 2016-10-22 DIAGNOSIS — I872 Venous insufficiency (chronic) (peripheral): Secondary | ICD-10-CM | POA: Diagnosis not present

## 2016-10-24 DIAGNOSIS — Z960 Presence of urogenital implants: Secondary | ICD-10-CM | POA: Diagnosis not present

## 2016-10-24 DIAGNOSIS — I110 Hypertensive heart disease with heart failure: Secondary | ICD-10-CM | POA: Diagnosis not present

## 2016-10-24 DIAGNOSIS — L89312 Pressure ulcer of right buttock, stage 2: Secondary | ICD-10-CM | POA: Diagnosis not present

## 2016-10-24 DIAGNOSIS — I4891 Unspecified atrial fibrillation: Secondary | ICD-10-CM | POA: Diagnosis not present

## 2016-10-24 DIAGNOSIS — J449 Chronic obstructive pulmonary disease, unspecified: Secondary | ICD-10-CM | POA: Diagnosis not present

## 2016-10-24 DIAGNOSIS — I509 Heart failure, unspecified: Secondary | ICD-10-CM | POA: Diagnosis not present

## 2016-10-24 DIAGNOSIS — Z7901 Long term (current) use of anticoagulants: Secondary | ICD-10-CM | POA: Diagnosis not present

## 2016-10-24 DIAGNOSIS — N319 Neuromuscular dysfunction of bladder, unspecified: Secondary | ICD-10-CM | POA: Diagnosis not present

## 2016-10-24 DIAGNOSIS — I872 Venous insufficiency (chronic) (peripheral): Secondary | ICD-10-CM | POA: Diagnosis not present

## 2016-10-28 DIAGNOSIS — L89312 Pressure ulcer of right buttock, stage 2: Secondary | ICD-10-CM | POA: Diagnosis not present

## 2016-10-28 DIAGNOSIS — Z7901 Long term (current) use of anticoagulants: Secondary | ICD-10-CM | POA: Diagnosis not present

## 2016-10-28 DIAGNOSIS — I509 Heart failure, unspecified: Secondary | ICD-10-CM | POA: Diagnosis not present

## 2016-10-28 DIAGNOSIS — Z960 Presence of urogenital implants: Secondary | ICD-10-CM | POA: Diagnosis not present

## 2016-10-28 DIAGNOSIS — I872 Venous insufficiency (chronic) (peripheral): Secondary | ICD-10-CM | POA: Diagnosis not present

## 2016-10-28 DIAGNOSIS — I4891 Unspecified atrial fibrillation: Secondary | ICD-10-CM | POA: Diagnosis not present

## 2016-10-28 DIAGNOSIS — N319 Neuromuscular dysfunction of bladder, unspecified: Secondary | ICD-10-CM | POA: Diagnosis not present

## 2016-10-28 DIAGNOSIS — J449 Chronic obstructive pulmonary disease, unspecified: Secondary | ICD-10-CM | POA: Diagnosis not present

## 2016-10-28 DIAGNOSIS — I110 Hypertensive heart disease with heart failure: Secondary | ICD-10-CM | POA: Diagnosis not present

## 2016-10-31 ENCOUNTER — Other Ambulatory Visit (HOSPITAL_COMMUNITY)
Admission: RE | Admit: 2016-10-31 | Discharge: 2016-10-31 | Disposition: A | Discharge status: Home / Self Care | Payer: Medicare Other | Source: Other Acute Inpatient Hospital | Attending: Pulmonary Disease | Admitting: Pulmonary Disease

## 2016-10-31 DIAGNOSIS — N390 Urinary tract infection, site not specified: Secondary | ICD-10-CM | POA: Diagnosis not present

## 2016-10-31 DIAGNOSIS — L89312 Pressure ulcer of right buttock, stage 2: Secondary | ICD-10-CM | POA: Diagnosis not present

## 2016-10-31 DIAGNOSIS — Z7901 Long term (current) use of anticoagulants: Secondary | ICD-10-CM | POA: Diagnosis not present

## 2016-10-31 DIAGNOSIS — J449 Chronic obstructive pulmonary disease, unspecified: Secondary | ICD-10-CM | POA: Diagnosis not present

## 2016-10-31 DIAGNOSIS — Z960 Presence of urogenital implants: Secondary | ICD-10-CM | POA: Diagnosis not present

## 2016-10-31 DIAGNOSIS — I509 Heart failure, unspecified: Secondary | ICD-10-CM | POA: Diagnosis not present

## 2016-10-31 DIAGNOSIS — I110 Hypertensive heart disease with heart failure: Secondary | ICD-10-CM | POA: Diagnosis not present

## 2016-10-31 DIAGNOSIS — I872 Venous insufficiency (chronic) (peripheral): Secondary | ICD-10-CM | POA: Diagnosis not present

## 2016-10-31 DIAGNOSIS — N319 Neuromuscular dysfunction of bladder, unspecified: Secondary | ICD-10-CM | POA: Diagnosis not present

## 2016-10-31 DIAGNOSIS — I4891 Unspecified atrial fibrillation: Secondary | ICD-10-CM | POA: Diagnosis not present

## 2016-10-31 LAB — URINALYSIS, MICROSCOPIC (REFLEX): Squamous Epithelial / HPF: NONE SEEN

## 2016-10-31 LAB — URINALYSIS, ROUTINE W REFLEX MICROSCOPIC
GLUCOSE, UA: NEGATIVE mg/dL
KETONES UR: NEGATIVE mg/dL
NITRITE: POSITIVE — AB
PH: 6.5 (ref 5.0–8.0)
Protein, ur: 100 mg/dL — AB
SPECIFIC GRAVITY, URINE: 1.02 (ref 1.005–1.030)

## 2016-11-03 LAB — URINE CULTURE: Culture: >=100000 — AB

## 2016-11-05 DIAGNOSIS — J449 Chronic obstructive pulmonary disease, unspecified: Secondary | ICD-10-CM | POA: Diagnosis not present

## 2016-11-05 DIAGNOSIS — Z960 Presence of urogenital implants: Secondary | ICD-10-CM | POA: Diagnosis not present

## 2016-11-05 DIAGNOSIS — I872 Venous insufficiency (chronic) (peripheral): Secondary | ICD-10-CM | POA: Diagnosis not present

## 2016-11-05 DIAGNOSIS — N319 Neuromuscular dysfunction of bladder, unspecified: Secondary | ICD-10-CM | POA: Diagnosis not present

## 2016-11-05 DIAGNOSIS — L89312 Pressure ulcer of right buttock, stage 2: Secondary | ICD-10-CM | POA: Diagnosis not present

## 2016-11-05 DIAGNOSIS — I110 Hypertensive heart disease with heart failure: Secondary | ICD-10-CM | POA: Diagnosis not present

## 2016-11-05 DIAGNOSIS — I509 Heart failure, unspecified: Secondary | ICD-10-CM | POA: Diagnosis not present

## 2016-11-05 DIAGNOSIS — I4891 Unspecified atrial fibrillation: Secondary | ICD-10-CM | POA: Diagnosis not present

## 2016-11-05 DIAGNOSIS — Z7901 Long term (current) use of anticoagulants: Secondary | ICD-10-CM | POA: Diagnosis not present

## 2016-11-08 DIAGNOSIS — I509 Heart failure, unspecified: Secondary | ICD-10-CM | POA: Diagnosis not present

## 2016-11-08 DIAGNOSIS — N319 Neuromuscular dysfunction of bladder, unspecified: Secondary | ICD-10-CM | POA: Diagnosis not present

## 2016-11-08 DIAGNOSIS — L89312 Pressure ulcer of right buttock, stage 2: Secondary | ICD-10-CM | POA: Diagnosis not present

## 2016-11-08 DIAGNOSIS — I110 Hypertensive heart disease with heart failure: Secondary | ICD-10-CM | POA: Diagnosis not present

## 2016-11-08 DIAGNOSIS — I4891 Unspecified atrial fibrillation: Secondary | ICD-10-CM | POA: Diagnosis not present

## 2016-11-08 DIAGNOSIS — I872 Venous insufficiency (chronic) (peripheral): Secondary | ICD-10-CM | POA: Diagnosis not present

## 2016-11-08 DIAGNOSIS — J449 Chronic obstructive pulmonary disease, unspecified: Secondary | ICD-10-CM | POA: Diagnosis not present

## 2016-11-08 DIAGNOSIS — Z960 Presence of urogenital implants: Secondary | ICD-10-CM | POA: Diagnosis not present

## 2016-11-08 DIAGNOSIS — Z7901 Long term (current) use of anticoagulants: Secondary | ICD-10-CM | POA: Diagnosis not present

## 2016-11-12 DIAGNOSIS — I10 Essential (primary) hypertension: Secondary | ICD-10-CM | POA: Diagnosis not present

## 2016-11-12 DIAGNOSIS — J449 Chronic obstructive pulmonary disease, unspecified: Secondary | ICD-10-CM | POA: Diagnosis not present

## 2016-11-12 DIAGNOSIS — I4891 Unspecified atrial fibrillation: Secondary | ICD-10-CM | POA: Diagnosis not present

## 2016-11-13 DIAGNOSIS — I110 Hypertensive heart disease with heart failure: Secondary | ICD-10-CM | POA: Diagnosis not present

## 2016-11-13 DIAGNOSIS — I509 Heart failure, unspecified: Secondary | ICD-10-CM | POA: Diagnosis not present

## 2016-11-13 DIAGNOSIS — Z960 Presence of urogenital implants: Secondary | ICD-10-CM | POA: Diagnosis not present

## 2016-11-13 DIAGNOSIS — N319 Neuromuscular dysfunction of bladder, unspecified: Secondary | ICD-10-CM | POA: Diagnosis not present

## 2016-11-13 DIAGNOSIS — I4891 Unspecified atrial fibrillation: Secondary | ICD-10-CM | POA: Diagnosis not present

## 2016-11-13 DIAGNOSIS — L89312 Pressure ulcer of right buttock, stage 2: Secondary | ICD-10-CM | POA: Diagnosis not present

## 2016-11-13 DIAGNOSIS — I872 Venous insufficiency (chronic) (peripheral): Secondary | ICD-10-CM | POA: Diagnosis not present

## 2016-11-13 DIAGNOSIS — Z7901 Long term (current) use of anticoagulants: Secondary | ICD-10-CM | POA: Diagnosis not present

## 2016-11-13 DIAGNOSIS — J449 Chronic obstructive pulmonary disease, unspecified: Secondary | ICD-10-CM | POA: Diagnosis not present

## 2016-11-19 DIAGNOSIS — R339 Retention of urine, unspecified: Secondary | ICD-10-CM | POA: Diagnosis not present

## 2016-11-19 DIAGNOSIS — Z960 Presence of urogenital implants: Secondary | ICD-10-CM | POA: Diagnosis not present

## 2016-11-19 DIAGNOSIS — J449 Chronic obstructive pulmonary disease, unspecified: Secondary | ICD-10-CM | POA: Diagnosis not present

## 2016-11-19 DIAGNOSIS — I110 Hypertensive heart disease with heart failure: Secondary | ICD-10-CM | POA: Diagnosis not present

## 2016-11-19 DIAGNOSIS — I4891 Unspecified atrial fibrillation: Secondary | ICD-10-CM | POA: Diagnosis not present

## 2016-11-19 DIAGNOSIS — N319 Neuromuscular dysfunction of bladder, unspecified: Secondary | ICD-10-CM | POA: Diagnosis not present

## 2016-11-19 DIAGNOSIS — I872 Venous insufficiency (chronic) (peripheral): Secondary | ICD-10-CM | POA: Diagnosis not present

## 2016-11-19 DIAGNOSIS — I509 Heart failure, unspecified: Secondary | ICD-10-CM | POA: Diagnosis not present

## 2016-11-19 DIAGNOSIS — L89312 Pressure ulcer of right buttock, stage 2: Secondary | ICD-10-CM | POA: Diagnosis not present

## 2016-11-19 DIAGNOSIS — Z7901 Long term (current) use of anticoagulants: Secondary | ICD-10-CM | POA: Diagnosis not present

## 2016-11-22 DIAGNOSIS — I509 Heart failure, unspecified: Secondary | ICD-10-CM | POA: Diagnosis not present

## 2016-11-22 DIAGNOSIS — Z960 Presence of urogenital implants: Secondary | ICD-10-CM | POA: Diagnosis not present

## 2016-11-22 DIAGNOSIS — I872 Venous insufficiency (chronic) (peripheral): Secondary | ICD-10-CM | POA: Diagnosis not present

## 2016-11-22 DIAGNOSIS — I4891 Unspecified atrial fibrillation: Secondary | ICD-10-CM | POA: Diagnosis not present

## 2016-11-22 DIAGNOSIS — I110 Hypertensive heart disease with heart failure: Secondary | ICD-10-CM | POA: Diagnosis not present

## 2016-11-22 DIAGNOSIS — L89312 Pressure ulcer of right buttock, stage 2: Secondary | ICD-10-CM | POA: Diagnosis not present

## 2016-11-22 DIAGNOSIS — Z7901 Long term (current) use of anticoagulants: Secondary | ICD-10-CM | POA: Diagnosis not present

## 2016-11-22 DIAGNOSIS — J449 Chronic obstructive pulmonary disease, unspecified: Secondary | ICD-10-CM | POA: Diagnosis not present

## 2016-11-22 DIAGNOSIS — N319 Neuromuscular dysfunction of bladder, unspecified: Secondary | ICD-10-CM | POA: Diagnosis not present

## 2016-11-26 DIAGNOSIS — Z7901 Long term (current) use of anticoagulants: Secondary | ICD-10-CM | POA: Diagnosis not present

## 2016-11-26 DIAGNOSIS — Z79891 Long term (current) use of opiate analgesic: Secondary | ICD-10-CM | POA: Diagnosis not present

## 2016-11-26 DIAGNOSIS — J9611 Chronic respiratory failure with hypoxia: Secondary | ICD-10-CM | POA: Diagnosis not present

## 2016-11-26 DIAGNOSIS — Z466 Encounter for fitting and adjustment of urinary device: Secondary | ICD-10-CM | POA: Diagnosis not present

## 2016-11-26 DIAGNOSIS — Z4800 Encounter for change or removal of nonsurgical wound dressing: Secondary | ICD-10-CM | POA: Diagnosis not present

## 2016-11-26 DIAGNOSIS — I509 Heart failure, unspecified: Secondary | ICD-10-CM | POA: Diagnosis not present

## 2016-11-26 DIAGNOSIS — I4891 Unspecified atrial fibrillation: Secondary | ICD-10-CM | POA: Diagnosis not present

## 2016-11-26 DIAGNOSIS — I110 Hypertensive heart disease with heart failure: Secondary | ICD-10-CM | POA: Diagnosis not present

## 2016-11-26 DIAGNOSIS — Z9181 History of falling: Secondary | ICD-10-CM | POA: Diagnosis not present

## 2016-11-26 DIAGNOSIS — Z87891 Personal history of nicotine dependence: Secondary | ICD-10-CM | POA: Diagnosis not present

## 2016-11-26 DIAGNOSIS — N319 Neuromuscular dysfunction of bladder, unspecified: Secondary | ICD-10-CM | POA: Diagnosis not present

## 2016-11-26 DIAGNOSIS — J449 Chronic obstructive pulmonary disease, unspecified: Secondary | ICD-10-CM | POA: Diagnosis not present

## 2016-11-26 DIAGNOSIS — L89312 Pressure ulcer of right buttock, stage 2: Secondary | ICD-10-CM | POA: Diagnosis not present

## 2016-11-26 DIAGNOSIS — G9341 Metabolic encephalopathy: Secondary | ICD-10-CM | POA: Diagnosis not present

## 2016-11-29 DIAGNOSIS — Z79891 Long term (current) use of opiate analgesic: Secondary | ICD-10-CM | POA: Diagnosis not present

## 2016-11-29 DIAGNOSIS — I110 Hypertensive heart disease with heart failure: Secondary | ICD-10-CM | POA: Diagnosis not present

## 2016-11-29 DIAGNOSIS — J449 Chronic obstructive pulmonary disease, unspecified: Secondary | ICD-10-CM | POA: Diagnosis not present

## 2016-11-29 DIAGNOSIS — Z87891 Personal history of nicotine dependence: Secondary | ICD-10-CM | POA: Diagnosis not present

## 2016-11-29 DIAGNOSIS — N319 Neuromuscular dysfunction of bladder, unspecified: Secondary | ICD-10-CM | POA: Diagnosis not present

## 2016-11-29 DIAGNOSIS — J9611 Chronic respiratory failure with hypoxia: Secondary | ICD-10-CM | POA: Diagnosis not present

## 2016-11-29 DIAGNOSIS — Z4800 Encounter for change or removal of nonsurgical wound dressing: Secondary | ICD-10-CM | POA: Diagnosis not present

## 2016-11-29 DIAGNOSIS — Z466 Encounter for fitting and adjustment of urinary device: Secondary | ICD-10-CM | POA: Diagnosis not present

## 2016-11-29 DIAGNOSIS — L89312 Pressure ulcer of right buttock, stage 2: Secondary | ICD-10-CM | POA: Diagnosis not present

## 2016-11-29 DIAGNOSIS — Z7901 Long term (current) use of anticoagulants: Secondary | ICD-10-CM | POA: Diagnosis not present

## 2016-11-29 DIAGNOSIS — I509 Heart failure, unspecified: Secondary | ICD-10-CM | POA: Diagnosis not present

## 2016-11-29 DIAGNOSIS — I4891 Unspecified atrial fibrillation: Secondary | ICD-10-CM | POA: Diagnosis not present

## 2016-11-29 DIAGNOSIS — Z9181 History of falling: Secondary | ICD-10-CM | POA: Diagnosis not present

## 2016-11-29 DIAGNOSIS — G9341 Metabolic encephalopathy: Secondary | ICD-10-CM | POA: Diagnosis not present

## 2016-12-03 DIAGNOSIS — Z9181 History of falling: Secondary | ICD-10-CM | POA: Diagnosis not present

## 2016-12-03 DIAGNOSIS — Z4800 Encounter for change or removal of nonsurgical wound dressing: Secondary | ICD-10-CM | POA: Diagnosis not present

## 2016-12-03 DIAGNOSIS — I110 Hypertensive heart disease with heart failure: Secondary | ICD-10-CM | POA: Diagnosis not present

## 2016-12-03 DIAGNOSIS — J9611 Chronic respiratory failure with hypoxia: Secondary | ICD-10-CM | POA: Diagnosis not present

## 2016-12-03 DIAGNOSIS — I509 Heart failure, unspecified: Secondary | ICD-10-CM | POA: Diagnosis not present

## 2016-12-03 DIAGNOSIS — Z7901 Long term (current) use of anticoagulants: Secondary | ICD-10-CM | POA: Diagnosis not present

## 2016-12-03 DIAGNOSIS — N319 Neuromuscular dysfunction of bladder, unspecified: Secondary | ICD-10-CM | POA: Diagnosis not present

## 2016-12-03 DIAGNOSIS — Z466 Encounter for fitting and adjustment of urinary device: Secondary | ICD-10-CM | POA: Diagnosis not present

## 2016-12-03 DIAGNOSIS — I4891 Unspecified atrial fibrillation: Secondary | ICD-10-CM | POA: Diagnosis not present

## 2016-12-03 DIAGNOSIS — L89312 Pressure ulcer of right buttock, stage 2: Secondary | ICD-10-CM | POA: Diagnosis not present

## 2016-12-03 DIAGNOSIS — Z79891 Long term (current) use of opiate analgesic: Secondary | ICD-10-CM | POA: Diagnosis not present

## 2016-12-03 DIAGNOSIS — G9341 Metabolic encephalopathy: Secondary | ICD-10-CM | POA: Diagnosis not present

## 2016-12-03 DIAGNOSIS — J449 Chronic obstructive pulmonary disease, unspecified: Secondary | ICD-10-CM | POA: Diagnosis not present

## 2016-12-03 DIAGNOSIS — Z87891 Personal history of nicotine dependence: Secondary | ICD-10-CM | POA: Diagnosis not present

## 2016-12-06 DIAGNOSIS — N319 Neuromuscular dysfunction of bladder, unspecified: Secondary | ICD-10-CM | POA: Diagnosis not present

## 2016-12-06 DIAGNOSIS — L89312 Pressure ulcer of right buttock, stage 2: Secondary | ICD-10-CM | POA: Diagnosis not present

## 2016-12-06 DIAGNOSIS — G9341 Metabolic encephalopathy: Secondary | ICD-10-CM | POA: Diagnosis not present

## 2016-12-06 DIAGNOSIS — I4891 Unspecified atrial fibrillation: Secondary | ICD-10-CM | POA: Diagnosis not present

## 2016-12-06 DIAGNOSIS — J449 Chronic obstructive pulmonary disease, unspecified: Secondary | ICD-10-CM | POA: Diagnosis not present

## 2016-12-06 DIAGNOSIS — Z87891 Personal history of nicotine dependence: Secondary | ICD-10-CM | POA: Diagnosis not present

## 2016-12-06 DIAGNOSIS — I509 Heart failure, unspecified: Secondary | ICD-10-CM | POA: Diagnosis not present

## 2016-12-06 DIAGNOSIS — Z7901 Long term (current) use of anticoagulants: Secondary | ICD-10-CM | POA: Diagnosis not present

## 2016-12-06 DIAGNOSIS — Z9181 History of falling: Secondary | ICD-10-CM | POA: Diagnosis not present

## 2016-12-06 DIAGNOSIS — J9611 Chronic respiratory failure with hypoxia: Secondary | ICD-10-CM | POA: Diagnosis not present

## 2016-12-06 DIAGNOSIS — I110 Hypertensive heart disease with heart failure: Secondary | ICD-10-CM | POA: Diagnosis not present

## 2016-12-06 DIAGNOSIS — Z466 Encounter for fitting and adjustment of urinary device: Secondary | ICD-10-CM | POA: Diagnosis not present

## 2016-12-06 DIAGNOSIS — Z4800 Encounter for change or removal of nonsurgical wound dressing: Secondary | ICD-10-CM | POA: Diagnosis not present

## 2016-12-06 DIAGNOSIS — Z79891 Long term (current) use of opiate analgesic: Secondary | ICD-10-CM | POA: Diagnosis not present

## 2016-12-10 DIAGNOSIS — Z87891 Personal history of nicotine dependence: Secondary | ICD-10-CM | POA: Diagnosis not present

## 2016-12-10 DIAGNOSIS — I4891 Unspecified atrial fibrillation: Secondary | ICD-10-CM | POA: Diagnosis not present

## 2016-12-10 DIAGNOSIS — J449 Chronic obstructive pulmonary disease, unspecified: Secondary | ICD-10-CM | POA: Diagnosis not present

## 2016-12-10 DIAGNOSIS — I509 Heart failure, unspecified: Secondary | ICD-10-CM | POA: Diagnosis not present

## 2016-12-10 DIAGNOSIS — Z466 Encounter for fitting and adjustment of urinary device: Secondary | ICD-10-CM | POA: Diagnosis not present

## 2016-12-10 DIAGNOSIS — Z9181 History of falling: Secondary | ICD-10-CM | POA: Diagnosis not present

## 2016-12-10 DIAGNOSIS — G9341 Metabolic encephalopathy: Secondary | ICD-10-CM | POA: Diagnosis not present

## 2016-12-10 DIAGNOSIS — Z79891 Long term (current) use of opiate analgesic: Secondary | ICD-10-CM | POA: Diagnosis not present

## 2016-12-10 DIAGNOSIS — J9611 Chronic respiratory failure with hypoxia: Secondary | ICD-10-CM | POA: Diagnosis not present

## 2016-12-10 DIAGNOSIS — I110 Hypertensive heart disease with heart failure: Secondary | ICD-10-CM | POA: Diagnosis not present

## 2016-12-10 DIAGNOSIS — N319 Neuromuscular dysfunction of bladder, unspecified: Secondary | ICD-10-CM | POA: Diagnosis not present

## 2016-12-10 DIAGNOSIS — Z7901 Long term (current) use of anticoagulants: Secondary | ICD-10-CM | POA: Diagnosis not present

## 2016-12-10 DIAGNOSIS — L89312 Pressure ulcer of right buttock, stage 2: Secondary | ICD-10-CM | POA: Diagnosis not present

## 2016-12-10 DIAGNOSIS — Z4800 Encounter for change or removal of nonsurgical wound dressing: Secondary | ICD-10-CM | POA: Diagnosis not present

## 2016-12-12 DIAGNOSIS — J9611 Chronic respiratory failure with hypoxia: Secondary | ICD-10-CM | POA: Diagnosis not present

## 2016-12-12 DIAGNOSIS — I110 Hypertensive heart disease with heart failure: Secondary | ICD-10-CM | POA: Diagnosis not present

## 2016-12-12 DIAGNOSIS — G9341 Metabolic encephalopathy: Secondary | ICD-10-CM | POA: Diagnosis not present

## 2016-12-12 DIAGNOSIS — I4891 Unspecified atrial fibrillation: Secondary | ICD-10-CM | POA: Diagnosis not present

## 2016-12-12 DIAGNOSIS — Z466 Encounter for fitting and adjustment of urinary device: Secondary | ICD-10-CM | POA: Diagnosis not present

## 2016-12-12 DIAGNOSIS — Z79891 Long term (current) use of opiate analgesic: Secondary | ICD-10-CM | POA: Diagnosis not present

## 2016-12-12 DIAGNOSIS — N319 Neuromuscular dysfunction of bladder, unspecified: Secondary | ICD-10-CM | POA: Diagnosis not present

## 2016-12-12 DIAGNOSIS — Z87891 Personal history of nicotine dependence: Secondary | ICD-10-CM | POA: Diagnosis not present

## 2016-12-12 DIAGNOSIS — L89312 Pressure ulcer of right buttock, stage 2: Secondary | ICD-10-CM | POA: Diagnosis not present

## 2016-12-12 DIAGNOSIS — Z7901 Long term (current) use of anticoagulants: Secondary | ICD-10-CM | POA: Diagnosis not present

## 2016-12-12 DIAGNOSIS — Z4800 Encounter for change or removal of nonsurgical wound dressing: Secondary | ICD-10-CM | POA: Diagnosis not present

## 2016-12-12 DIAGNOSIS — I509 Heart failure, unspecified: Secondary | ICD-10-CM | POA: Diagnosis not present

## 2016-12-12 DIAGNOSIS — J449 Chronic obstructive pulmonary disease, unspecified: Secondary | ICD-10-CM | POA: Diagnosis not present

## 2016-12-12 DIAGNOSIS — Z9181 History of falling: Secondary | ICD-10-CM | POA: Diagnosis not present

## 2016-12-16 DIAGNOSIS — Z7901 Long term (current) use of anticoagulants: Secondary | ICD-10-CM | POA: Diagnosis not present

## 2016-12-16 DIAGNOSIS — I4891 Unspecified atrial fibrillation: Secondary | ICD-10-CM | POA: Diagnosis not present

## 2016-12-16 DIAGNOSIS — L89312 Pressure ulcer of right buttock, stage 2: Secondary | ICD-10-CM | POA: Diagnosis not present

## 2016-12-16 DIAGNOSIS — Z9181 History of falling: Secondary | ICD-10-CM | POA: Diagnosis not present

## 2016-12-16 DIAGNOSIS — Z79891 Long term (current) use of opiate analgesic: Secondary | ICD-10-CM | POA: Diagnosis not present

## 2016-12-16 DIAGNOSIS — J9611 Chronic respiratory failure with hypoxia: Secondary | ICD-10-CM | POA: Diagnosis not present

## 2016-12-16 DIAGNOSIS — Z466 Encounter for fitting and adjustment of urinary device: Secondary | ICD-10-CM | POA: Diagnosis not present

## 2016-12-16 DIAGNOSIS — Z4800 Encounter for change or removal of nonsurgical wound dressing: Secondary | ICD-10-CM | POA: Diagnosis not present

## 2016-12-16 DIAGNOSIS — I110 Hypertensive heart disease with heart failure: Secondary | ICD-10-CM | POA: Diagnosis not present

## 2016-12-16 DIAGNOSIS — N319 Neuromuscular dysfunction of bladder, unspecified: Secondary | ICD-10-CM | POA: Diagnosis not present

## 2016-12-16 DIAGNOSIS — Z87891 Personal history of nicotine dependence: Secondary | ICD-10-CM | POA: Diagnosis not present

## 2016-12-16 DIAGNOSIS — J449 Chronic obstructive pulmonary disease, unspecified: Secondary | ICD-10-CM | POA: Diagnosis not present

## 2016-12-16 DIAGNOSIS — I509 Heart failure, unspecified: Secondary | ICD-10-CM | POA: Diagnosis not present

## 2016-12-16 DIAGNOSIS — G9341 Metabolic encephalopathy: Secondary | ICD-10-CM | POA: Diagnosis not present

## 2016-12-19 DIAGNOSIS — I110 Hypertensive heart disease with heart failure: Secondary | ICD-10-CM | POA: Diagnosis not present

## 2016-12-19 DIAGNOSIS — I509 Heart failure, unspecified: Secondary | ICD-10-CM | POA: Diagnosis not present

## 2016-12-19 DIAGNOSIS — I4891 Unspecified atrial fibrillation: Secondary | ICD-10-CM | POA: Diagnosis not present

## 2016-12-19 DIAGNOSIS — G9341 Metabolic encephalopathy: Secondary | ICD-10-CM | POA: Diagnosis not present

## 2016-12-19 DIAGNOSIS — Z79891 Long term (current) use of opiate analgesic: Secondary | ICD-10-CM | POA: Diagnosis not present

## 2016-12-19 DIAGNOSIS — J449 Chronic obstructive pulmonary disease, unspecified: Secondary | ICD-10-CM | POA: Diagnosis not present

## 2016-12-19 DIAGNOSIS — L89312 Pressure ulcer of right buttock, stage 2: Secondary | ICD-10-CM | POA: Diagnosis not present

## 2016-12-19 DIAGNOSIS — Z7901 Long term (current) use of anticoagulants: Secondary | ICD-10-CM | POA: Diagnosis not present

## 2016-12-19 DIAGNOSIS — Z9181 History of falling: Secondary | ICD-10-CM | POA: Diagnosis not present

## 2016-12-19 DIAGNOSIS — N319 Neuromuscular dysfunction of bladder, unspecified: Secondary | ICD-10-CM | POA: Diagnosis not present

## 2016-12-19 DIAGNOSIS — J9611 Chronic respiratory failure with hypoxia: Secondary | ICD-10-CM | POA: Diagnosis not present

## 2016-12-19 DIAGNOSIS — Z466 Encounter for fitting and adjustment of urinary device: Secondary | ICD-10-CM | POA: Diagnosis not present

## 2016-12-19 DIAGNOSIS — Z87891 Personal history of nicotine dependence: Secondary | ICD-10-CM | POA: Diagnosis not present

## 2016-12-19 DIAGNOSIS — Z4800 Encounter for change or removal of nonsurgical wound dressing: Secondary | ICD-10-CM | POA: Diagnosis not present

## 2016-12-24 ENCOUNTER — Other Ambulatory Visit (HOSPITAL_COMMUNITY)
Admission: RE | Admit: 2016-12-24 | Discharge: 2016-12-24 | Disposition: A | Discharge status: Home / Self Care | Payer: Medicare Other | Source: Other Acute Inpatient Hospital | Attending: Pulmonary Disease | Admitting: Pulmonary Disease

## 2016-12-24 DIAGNOSIS — J449 Chronic obstructive pulmonary disease, unspecified: Secondary | ICD-10-CM | POA: Diagnosis not present

## 2016-12-24 DIAGNOSIS — I509 Heart failure, unspecified: Secondary | ICD-10-CM | POA: Diagnosis not present

## 2016-12-24 DIAGNOSIS — Z466 Encounter for fitting and adjustment of urinary device: Secondary | ICD-10-CM | POA: Diagnosis not present

## 2016-12-24 DIAGNOSIS — Z79891 Long term (current) use of opiate analgesic: Secondary | ICD-10-CM | POA: Diagnosis not present

## 2016-12-24 DIAGNOSIS — Z7901 Long term (current) use of anticoagulants: Secondary | ICD-10-CM | POA: Diagnosis not present

## 2016-12-24 DIAGNOSIS — Z9181 History of falling: Secondary | ICD-10-CM | POA: Diagnosis not present

## 2016-12-24 DIAGNOSIS — N319 Neuromuscular dysfunction of bladder, unspecified: Secondary | ICD-10-CM | POA: Diagnosis not present

## 2016-12-24 DIAGNOSIS — I4891 Unspecified atrial fibrillation: Secondary | ICD-10-CM | POA: Diagnosis not present

## 2016-12-24 DIAGNOSIS — Z4800 Encounter for change or removal of nonsurgical wound dressing: Secondary | ICD-10-CM | POA: Diagnosis not present

## 2016-12-24 DIAGNOSIS — J9611 Chronic respiratory failure with hypoxia: Secondary | ICD-10-CM | POA: Diagnosis not present

## 2016-12-24 DIAGNOSIS — N390 Urinary tract infection, site not specified: Secondary | ICD-10-CM | POA: Insufficient documentation

## 2016-12-24 DIAGNOSIS — L89312 Pressure ulcer of right buttock, stage 2: Secondary | ICD-10-CM | POA: Diagnosis not present

## 2016-12-24 DIAGNOSIS — G9341 Metabolic encephalopathy: Secondary | ICD-10-CM | POA: Diagnosis not present

## 2016-12-24 DIAGNOSIS — Z87891 Personal history of nicotine dependence: Secondary | ICD-10-CM | POA: Diagnosis not present

## 2016-12-24 DIAGNOSIS — I110 Hypertensive heart disease with heart failure: Secondary | ICD-10-CM | POA: Diagnosis not present

## 2016-12-24 LAB — URINALYSIS, MICROSCOPIC (REFLEX): Squamous Epithelial / LPF: NONE SEEN

## 2016-12-24 LAB — URINALYSIS, ROUTINE W REFLEX MICROSCOPIC

## 2016-12-27 DIAGNOSIS — J9611 Chronic respiratory failure with hypoxia: Secondary | ICD-10-CM | POA: Diagnosis not present

## 2016-12-27 DIAGNOSIS — N319 Neuromuscular dysfunction of bladder, unspecified: Secondary | ICD-10-CM | POA: Diagnosis not present

## 2016-12-27 DIAGNOSIS — Z79891 Long term (current) use of opiate analgesic: Secondary | ICD-10-CM | POA: Diagnosis not present

## 2016-12-27 DIAGNOSIS — I509 Heart failure, unspecified: Secondary | ICD-10-CM | POA: Diagnosis not present

## 2016-12-27 DIAGNOSIS — Z7901 Long term (current) use of anticoagulants: Secondary | ICD-10-CM | POA: Diagnosis not present

## 2016-12-27 DIAGNOSIS — J449 Chronic obstructive pulmonary disease, unspecified: Secondary | ICD-10-CM | POA: Diagnosis not present

## 2016-12-27 DIAGNOSIS — R339 Retention of urine, unspecified: Secondary | ICD-10-CM | POA: Diagnosis not present

## 2016-12-27 DIAGNOSIS — Z87891 Personal history of nicotine dependence: Secondary | ICD-10-CM | POA: Diagnosis not present

## 2016-12-27 DIAGNOSIS — Z4800 Encounter for change or removal of nonsurgical wound dressing: Secondary | ICD-10-CM | POA: Diagnosis not present

## 2016-12-27 DIAGNOSIS — Z466 Encounter for fitting and adjustment of urinary device: Secondary | ICD-10-CM | POA: Diagnosis not present

## 2016-12-27 DIAGNOSIS — I110 Hypertensive heart disease with heart failure: Secondary | ICD-10-CM | POA: Diagnosis not present

## 2016-12-27 DIAGNOSIS — I4891 Unspecified atrial fibrillation: Secondary | ICD-10-CM | POA: Diagnosis not present

## 2016-12-27 DIAGNOSIS — G9341 Metabolic encephalopathy: Secondary | ICD-10-CM | POA: Diagnosis not present

## 2016-12-27 DIAGNOSIS — Z9181 History of falling: Secondary | ICD-10-CM | POA: Diagnosis not present

## 2016-12-27 DIAGNOSIS — L89312 Pressure ulcer of right buttock, stage 2: Secondary | ICD-10-CM | POA: Diagnosis not present

## 2016-12-27 LAB — URINE CULTURE

## 2016-12-28 ENCOUNTER — Observation Stay (HOSPITAL_COMMUNITY)
Admission: EM | Admit: 2016-12-28 | Discharge: 2016-12-30 | Disposition: A | Discharge status: Home / Self Care | Payer: Medicare Other | Attending: Emergency Medicine | Admitting: Emergency Medicine

## 2016-12-28 ENCOUNTER — Emergency Department (HOSPITAL_COMMUNITY): Payer: Medicare Other

## 2016-12-28 ENCOUNTER — Encounter (HOSPITAL_COMMUNITY): Payer: Self-pay | Admitting: Cardiology

## 2016-12-28 DIAGNOSIS — N3000 Acute cystitis without hematuria: Secondary | ICD-10-CM | POA: Diagnosis not present

## 2016-12-28 DIAGNOSIS — I6789 Other cerebrovascular disease: Secondary | ICD-10-CM | POA: Diagnosis not present

## 2016-12-28 DIAGNOSIS — Z87891 Personal history of nicotine dependence: Secondary | ICD-10-CM | POA: Insufficient documentation

## 2016-12-28 DIAGNOSIS — I10 Essential (primary) hypertension: Secondary | ICD-10-CM | POA: Diagnosis not present

## 2016-12-28 DIAGNOSIS — J449 Chronic obstructive pulmonary disease, unspecified: Secondary | ICD-10-CM | POA: Insufficient documentation

## 2016-12-28 DIAGNOSIS — Z7901 Long term (current) use of anticoagulants: Secondary | ICD-10-CM | POA: Diagnosis not present

## 2016-12-28 DIAGNOSIS — R531 Weakness: Secondary | ICD-10-CM | POA: Diagnosis not present

## 2016-12-28 DIAGNOSIS — N390 Urinary tract infection, site not specified: Secondary | ICD-10-CM | POA: Diagnosis present

## 2016-12-28 DIAGNOSIS — I509 Heart failure, unspecified: Secondary | ICD-10-CM | POA: Diagnosis not present

## 2016-12-28 DIAGNOSIS — Z960 Presence of urogenital implants: Secondary | ICD-10-CM

## 2016-12-28 DIAGNOSIS — Z978 Presence of other specified devices: Secondary | ICD-10-CM

## 2016-12-28 DIAGNOSIS — Z452 Encounter for adjustment and management of vascular access device: Secondary | ICD-10-CM | POA: Diagnosis not present

## 2016-12-28 DIAGNOSIS — I482 Chronic atrial fibrillation, unspecified: Secondary | ICD-10-CM | POA: Diagnosis present

## 2016-12-28 DIAGNOSIS — B952 Enterococcus as the cause of diseases classified elsewhere: Secondary | ICD-10-CM | POA: Diagnosis present

## 2016-12-28 DIAGNOSIS — N319 Neuromuscular dysfunction of bladder, unspecified: Secondary | ICD-10-CM | POA: Diagnosis present

## 2016-12-28 DIAGNOSIS — N3090 Cystitis, unspecified without hematuria: Secondary | ICD-10-CM | POA: Diagnosis not present

## 2016-12-28 DIAGNOSIS — Z9289 Personal history of other medical treatment: Secondary | ICD-10-CM | POA: Diagnosis not present

## 2016-12-28 LAB — CBC WITH DIFFERENTIAL/PLATELET
BASOS ABS: 0 10*3/uL (ref 0.0–0.1)
Basophils Relative: 0 %
EOS PCT: 2 %
Eosinophils Absolute: 0.4 10*3/uL (ref 0.0–0.7)
HEMATOCRIT: 21.5 % — AB (ref 39.0–52.0)
Hemoglobin: 7.2 g/dL — ABNORMAL LOW (ref 13.0–17.0)
LYMPHS PCT: 7 %
Lymphs Abs: 1.2 10*3/uL (ref 0.7–4.0)
MCH: 33.2 pg (ref 26.0–34.0)
MCHC: 33.5 g/dL (ref 30.0–36.0)
MCV: 99.1 fL (ref 78.0–100.0)
Monocytes Absolute: 1 10*3/uL (ref 0.1–1.0)
Monocytes Relative: 6 %
NEUTROS ABS: 14 10*3/uL — AB (ref 1.7–7.7)
NEUTROS PCT: 85 %
PLATELETS: 256 10*3/uL (ref 150–400)
RBC: 2.17 MIL/uL — AB (ref 4.22–5.81)
RDW: 14.4 % (ref 11.5–15.5)
WBC: 16.5 10*3/uL — AB (ref 4.0–10.5)

## 2016-12-28 LAB — URINALYSIS, MICROSCOPIC (REFLEX)

## 2016-12-28 LAB — URINALYSIS, ROUTINE W REFLEX MICROSCOPIC
Specific Gravity, Urine: 1.015 (ref 1.005–1.030)
pH: 7 (ref 5.0–8.0)

## 2016-12-28 LAB — BASIC METABOLIC PANEL
ANION GAP: 6 (ref 5–15)
BUN: 47 mg/dL — ABNORMAL HIGH (ref 6–20)
CO2: 24 mmol/L (ref 22–32)
Calcium: 8.5 mg/dL — ABNORMAL LOW (ref 8.9–10.3)
Chloride: 106 mmol/L (ref 101–111)
Creatinine, Ser: 1.7 mg/dL — ABNORMAL HIGH (ref 0.61–1.24)
GFR, EST AFRICAN AMERICAN: 43 mL/min — AB (ref >60)
GFR, EST NON AFRICAN AMERICAN: 37 mL/min — AB (ref >60)
Glucose, Bld: 108 mg/dL — ABNORMAL HIGH (ref 65–99)
POTASSIUM: 4.9 mmol/L (ref 3.5–5.1)
SODIUM: 136 mmol/L (ref 135–145)

## 2016-12-28 LAB — PROTIME-INR
INR: 1.58
Prothrombin Time: 19 seconds — ABNORMAL HIGH (ref 11.4–15.2)

## 2016-12-28 MED ORDER — TORSEMIDE 20 MG PO TABS
50.0000 mg | ORAL_TABLET | Freq: Every day | ORAL | Status: DC
  Administered 2016-12-28 – 2016-12-30 (×3): 50 mg via ORAL
  Filled 2016-12-28 (×3): qty 3

## 2016-12-28 MED ORDER — URELLE 81 MG PO TABS
1.0000 | ORAL_TABLET | Freq: Four times a day (QID) | ORAL | Status: DC
  Administered 2016-12-29: 81 mg via ORAL
  Filled 2016-12-28 (×8): qty 1

## 2016-12-28 MED ORDER — APIXABAN 2.5 MG PO TABS
2.5000 mg | ORAL_TABLET | Freq: Two times a day (BID) | ORAL | Status: DC
  Administered 2016-12-28 – 2016-12-30 (×4): 2.5 mg via ORAL
  Filled 2016-12-28 (×4): qty 1

## 2016-12-28 MED ORDER — TRIPELENNAMINE HCL POWD: 25.0000 mg | Freq: Two times a day (BID) | Status: DC

## 2016-12-28 MED ORDER — ESCITALOPRAM OXALATE 10 MG PO TABS
10.0000 mg | ORAL_TABLET | Freq: Every day | ORAL | Status: DC
  Administered 2016-12-29 – 2016-12-30 (×2): 10 mg via ORAL
  Filled 2016-12-28 (×2): qty 1

## 2016-12-28 MED ORDER — ONDANSETRON HCL 4 MG/2ML IJ SOLN: 4.0000 mg | Freq: Four times a day (QID) | INTRAMUSCULAR | Status: DC | PRN

## 2016-12-28 MED ORDER — ACETAMINOPHEN 325 MG PO TABS: 650.0000 mg | ORAL_TABLET | Freq: Four times a day (QID) | ORAL | Status: DC | PRN

## 2016-12-28 MED ORDER — LIDOCAINE HCL 2 % EX GEL: 1.0000 "application " | Freq: Once | CUTANEOUS | Status: DC

## 2016-12-28 MED ORDER — DILTIAZEM HCL ER COATED BEADS 240 MG PO TB24
240.0000 mg | ORAL_TABLET | Freq: Every day | ORAL | Status: DC
  Administered 2016-12-29 – 2016-12-30 (×2): 240 mg via ORAL
  Filled 2016-12-28 (×6): qty 1

## 2016-12-28 MED ORDER — POLYETHYLENE GLYCOL 3350 17 G PO PACK: 17.0000 g | PACK | Freq: Every day | ORAL | Status: DC | PRN

## 2016-12-28 MED ORDER — ALPRAZOLAM 0.25 MG PO TABS
0.2500 mg | ORAL_TABLET | Freq: Every day | ORAL | Status: DC | PRN
  Administered 2016-12-30: 0.25 mg via ORAL
  Filled 2016-12-28: qty 1

## 2016-12-28 MED ORDER — HYDROCODONE-ACETAMINOPHEN 10-325 MG PO TABS
1.0000 | ORAL_TABLET | ORAL | Status: DC | PRN
  Administered 2016-12-28 – 2016-12-30 (×8): 2 via ORAL
  Filled 2016-12-28 (×8): qty 2

## 2016-12-28 MED ORDER — VANCOMYCIN HCL 500 MG IV SOLR
500.0000 mg | INTRAVENOUS | Status: DC
  Administered 2016-12-29 – 2016-12-30 (×2): 500 mg via INTRAVENOUS
  Filled 2016-12-28 (×3): qty 500

## 2016-12-28 MED ORDER — ACETAMINOPHEN 650 MG RE SUPP: 650.0000 mg | Freq: Four times a day (QID) | RECTAL | Status: DC | PRN

## 2016-12-28 MED ORDER — ONDANSETRON HCL 4 MG PO TABS: 4.0000 mg | ORAL_TABLET | Freq: Four times a day (QID) | ORAL | Status: DC | PRN

## 2016-12-28 MED ORDER — VANCOMYCIN HCL IN DEXTROSE 1-5 GM/200ML-% IV SOLN
1000.0000 mg | Freq: Once | INTRAVENOUS | Status: AC
  Administered 2016-12-28: 1000 mg via INTRAVENOUS
  Filled 2016-12-28: qty 200

## 2016-12-28 MED ORDER — TIOTROPIUM BROMIDE MONOHYDRATE 18 MCG IN CAPS
1.0000 | ORAL_CAPSULE | Freq: Every day | RESPIRATORY_TRACT | Status: DC
  Administered 2016-12-29: 18 ug via RESPIRATORY_TRACT
  Filled 2016-12-28: qty 5

## 2016-12-28 MED ORDER — DOCUSATE SODIUM 100 MG PO CAPS
100.0000 mg | ORAL_CAPSULE | Freq: Two times a day (BID) | ORAL | Status: DC
  Administered 2016-12-28 – 2016-12-30 (×4): 100 mg via ORAL
  Filled 2016-12-28 (×4): qty 1

## 2016-12-28 NOTE — ED Provider Notes (Signed)
Emergency Department Provider Note   I have reviewed the triage vital signs and the nursing notes.   HISTORY  Chief Complaint Abnormal Lab   HPI Erik Atkins is a 76 y.o. male with PMH of COPD, a-fib on Eliquis, HTN, and anxiety presents to the emergency department for evaluation of urinary tract infection treatment. The patient was recently found to have a UTI resistant to multiple antibiotics. This, in conjunction with the patient's multiple medication allergies, limits his outpatient treatment options. He is currently followed by home health and his primary care physician. His PCP obtain the results and requested that he presented to the emergency department for PICC line placement and Vancomycin infusions at home. Apparently the patient and family spoke with Dr. Anastasio Champion. Home health can assist with daily infusions. Patient reports actually feeling better today. No fever, chills, or significant discomfort.    Past Medical History:  Diagnosis Date  . Anxiety   . Charcot Marie Tooth muscular atrophy   . COPD (chronic obstructive pulmonary disease) ([place])   . Hemorrhoids   . History of pneumonia   . Hypertension   . Nerve disorder 1996   "Charot Marie"  . Neuropathy   . Tubular adenoma     Patient Active Problem List   Diagnosis Date Noted  . UTI (urinary tract infection) due to Enterococcus 12/28/2016  . Pressure injury of skin 07/02/2016  . Atrial fibrillation with rapid ventricular response ([place]) 06/29/2016  . Congestive heart failure ([place]) 06/29/2016  . Cellulitis of right leg 06/24/2016  . Sepsis ([place]) 06/24/2016  . HTN (hypertension) 06/24/2016  . Neurogenic bladder 06/24/2016  . Chronic indwelling Foley catheter 06/24/2016    Past Surgical History:  Procedure Laterality Date  . CATARACT EXTRACTION, BILATERAL    . COLONOSCOPY  04/16/2010   Dr.Rourk- multiple cecal polyps, remainder of the colonic mucosa and rectal mucosa appeared normal. internal hemorrhoids  bx= tubular adenoma  . MASTECTOMY FOR GYNECOMASTIA  1979 and 1981  . POSTERIOR CERVICAL FUSION/FORAMINOTOMY  02/06/2012   Procedure: POSTERIOR CERVICAL FUSION/FORAMINOTOMY LEVEL 2;  Surgeon: Ophelia Charter, MD;  Location: [facility];  Service: Neurosurgery;  Laterality: N/A;  Posterior Cervical Three-Four/Four-Five Laminectomy and Fusion with Instrumentation  . TONSILLECTOMY      Current Outpatient Rx  . Order #: [id number] Class: Historical Med  . Order #: [id number] Class: No Print  . Order #: [id number] Class: Historical Med  . Order #: [id number] Class: No Print  . Order #: [id number] Class: Historical Med  . Order #: [id number] Class: Historical Med  . Order #: [id number] Class: Historical Med  . Order #: [id number] Class: Historical Med  . Order #: [id number] Class: Historical Med  . Order #: [id number] Class: Historical Med  . Order #: [id number] Class: Historical Med  . Order #: [id number] Class: Historical Med  . Order #: [id number] Class: Historical Med    Allergies Penicillins; Darvon; Celexa [citalopram hydrobromide]; Cortisone; Cymbalta [duloxetine hcl]; Eggs or egg-derived products; Guaifenesin er; Keflex [cephalexin]; Motrin [ibuprofen]; Pumpkin seed; Sweet potato; Tea; and Zoloft [sertraline]  History reviewed. No pertinent family history.  Social History Social History  Substance Use Topics  . Smoking status: Former Smoker    Packs/day: 0.50    Years: 50.00    Types: Cigarettes  . Smokeless tobacco: Never Used     Comment: uses e cigs  . Alcohol use No    Review of Systems  Constitutional: No fever/chills Eyes: No visual changes. ENT: No sore throat. Cardiovascular: Denies chest pain. Respiratory: Denies shortness of breath. Gastrointestinal: No abdominal pain.  No nausea, no vomiting.  No diarrhea.  No constipation. Genitourinary: Positive for dysuria (resolving).  Musculoskeletal: Negative for back pain. Skin: Negative for rash. Neurological: Negative for headaches, focal  weakness or numbness.  10-point ROS otherwise negative.  ____________________________________________   PHYSICAL EXAM:  VITAL SIGNS: ED Triage Vitals  Enc Vitals Group     BP 12/28/16 1054 117/62     Pulse Rate 12/28/16 1054 71     Resp 12/28/16 1054 (!) 21     Temp 12/28/16 1054 97.9 F (36.6 C)     Temp Source 12/28/16 1054 Oral     SpO2 12/28/16 1054 98 %     Weight 12/28/16 1053 126 lb (57.2 kg)     Height 12/28/16 1053 5\' 5"  (1.651 m)   Constitutional: Alert and oriented. Well appearing and in no acute distress. Eyes: Conjunctivae are normal. Head: Atraumatic. Nose: No congestion/rhinnorhea. Mouth/Throat: Mucous membranes are moist.  Oropharynx non-erythematous. Neck: No stridor.  Cardiovascular: Normal rate, regular rhythm. Good peripheral circulation. Grossly normal heart sounds.   Respiratory: Normal respiratory effort.  No retractions. Lungs CTAB. Gastrointestinal: Soft and nontender. No distention.  Musculoskeletal: No lower extremity tenderness nor edema. No gross deformities of extremities. Neurologic:  Normal speech and language. No gross focal neurologic deficits are appreciated.  Skin:  Skin is warm, dry and intact. No rash noted.  ____________________________________________   LABS (all labs ordered are listed, but only abnormal results are displayed)  Labs Reviewed  BASIC METABOLIC PANEL - Abnormal; Notable for the following:       Result Value   Glucose, Bld 108 (*)    BUN 47 (*)    Creatinine, Ser 1.70 (*)    Calcium 8.5 (*)    GFR calc non Af Amer 37 (*)    GFR calc Af Amer 43 (*)    All other components within normal limits  CBC WITH DIFFERENTIAL/PLATELET - Abnormal; Notable for the following:    WBC 16.5 (*)    RBC 2.17 (*)    Hemoglobin 7.2 (*)    HCT 21.5 (*)    Neutro Abs 14.0 (*)    All other components within normal limits  PROTIME-INR - Abnormal; Notable for the following:    Prothrombin Time 19.0 (*)    All other components  within normal limits  URINALYSIS, ROUTINE W REFLEX MICROSCOPIC - Abnormal; Notable for the following:    Color, Urine BROWN (*)    APPearance TURBID (*)    Glucose, UA   (*)    Value: TEST NOT REPORTED DUE TO COLOR INTERFERENCE OF URINE PIGMENT   Hgb urine dipstick   (*)    Value: TEST NOT REPORTED DUE TO COLOR INTERFERENCE OF URINE PIGMENT   Bilirubin Urine   (*)    Value: TEST NOT REPORTED DUE TO COLOR INTERFERENCE OF URINE PIGMENT   Ketones, ur   (*)    Value: TEST NOT REPORTED DUE TO COLOR INTERFERENCE OF URINE PIGMENT   Protein, ur   (*)    Value: TEST NOT REPORTED DUE TO COLOR INTERFERENCE OF URINE PIGMENT   Nitrite   (*)    Value: TEST NOT REPORTED DUE TO COLOR INTERFERENCE OF URINE PIGMENT   Leukocytes, UA   (*)    Value: TEST NOT REPORTED DUE TO COLOR INTERFERENCE OF URINE PIGMENT   All other components within normal limits  URINALYSIS, MICROSCOPIC (REFLEX) - Abnormal; Notable for the following:    Bacteria, UA MANY (*)    Squamous  Epithelial / LPF 0-5 (*)    All other components within normal limits  URINE CULTURE   ____________________________________________  RADIOLOGY  Dg Chest Portable 1 View  Result Date: 12/28/2016 CLINICAL DATA:  PICC line placement EXAM: PORTABLE CHEST 1 VIEW COMPARISON:  Chest x-ray dated 06/27/2016. FINDINGS: Right-sided PICC line in place with tip adequately positioned at the level of the mid/lower SVC. Coarse lung markings again noted bilaterally suggesting chronic interstitial lung disease/ fibrosis. No pleural effusion or pneumothorax seen. No evidence of pneumonia. Heart size and mediastinal contours are stable. Atherosclerotic changes noted at the aortic arch. IMPRESSION: 1. Right-sided PICC line in place with tip adequately positioned at the level of the mid/lower SVC. No pneumothorax. 2. Chronic interstitial lung disease/fibrosis, stable. No evidence of pneumonia or pulmonary edema. 3. Aortic atherosclerosis. Electronically Signed   By: Franki Cabot M.D.   On: 12/28/2016 13:33    ____________________________________________   PROCEDURES  Procedure(s) performed:   Procedures  None ____________________________________________   INITIAL IMPRESSION / ASSESSMENT AND PLAN / ED COURSE  Pertinent labs & imaging results that were available during my care of the patient were reviewed by me and considered in my medical decision making (see chart for details).  Patient resents to the emergency department with request to have PICC line placed and discharge home for vancomycin infusions with PCP to follow after discussion with home health and PCP today. Patient's recent urine culture shows Entercoccus sensitive to Vancomycin and Ampicillin. Patient has a known Penicillin allergy. Plan to repeat labs, UA, urine culture, and assist with PICC placement as requested. Patient is refusing to be admitted.   02:27 PM PICC line placed. Labs reviewed. Attempting to contact provider on call who sent patient to the ED to confirm plan for Vancomycin at home with PCP to follow.   Spoke with Home Health to clarify orders needed for home health. Trying to reach on call provider to confirm plan for home abx.   02:58 PM Spoke with Dr. Anastasio Champion by phone. He would prefer that the patient be admitted as obs to ensure he is improving and then have Dr. Luan Pulling discharge on Vanc infusions at home as he sees fit. He denies giving the patient/family any impression that he could simply go to the ED and be discharged on Vanc infusions. Patient is disappointed but understands.   Discussed patient's case with Hospitalist, Dr. Nehemiah Settle. Patient and family (if present) updated with plan. Care transferred to Hospitalist service.  I reviewed all nursing notes, vitals, pertinent old records, EKGs, labs, imaging (as available).  ____________________________________________  FINAL CLINICAL IMPRESSION(S) / ED DIAGNOSES  Final diagnoses:  Acute cystitis without  hematuria     MEDICATIONS GIVEN DURING THIS VISIT:  Medications  vancomycin (VANCOCIN) 500 mg in sodium chloride 0.9 % 100 mL IVPB (not administered)  vancomycin (VANCOCIN) IVPB 1000 mg/200 mL premix (1,000 mg Intravenous New Bag/Given 12/28/16 1338)     NEW OUTPATIENT MEDICATIONS STARTED DURING THIS VISIT:  None   Note:  This document was prepared using Dragon voice recognition software and may include unintentional dictation errors.  Nanda Quinton, MD Emergency Medicine    Long, W[facility], MD 12/28/16 [phone]

## 2016-12-28 NOTE — Progress Notes (Signed)
Pharmacy Antibiotic Note  Erik Atkins is a 76 y.o. male admitted on 12/28/2016 with UTI.  Pharmacy has been consulted for vancomycin dosing. He is allergic to PCN.  Plan: Vancomycin 1gm IV X 1 then Vancomycin 500 IV every 24 hours.  Goal trough 10-15 mcg/mL.  F/u renal function, cultures and clinical course  Height: 5\' 5"  (165.1 cm) Weight: 126 lb (57.2 kg) IBW/kg (Calculated) : 61.5  Temp (24hrs), Avg:97.9 F (36.6 C), Min:97.9 F (36.6 C), Max:97.9 F (36.6 C)   Recent Labs Lab 12/28/16 1134  WBC 16.5*  CREATININE 1.70*    Estimated Creatinine Clearance: 29.4 mL/min (A) (by C-G formula based on SCr of 1.7 mg/dL (H)).    Allergies  Allergen Reactions  . Penicillins     Has patient had a PCN reaction causing immediate rash, facial/tongue/throat swelling, SOB or lightheadedness with hypotension: Yes Has patient had a PCN reaction causing severe rash involving mucus membranes or skin necrosis: No Has patient had a PCN reaction that required hospitalization: No Has patient had a PCN reaction occurring within the last 10 years: No If all of the above answers are "NO", then may proceed with Cephalosporin use.   . Darvon Swelling  . Celexa [Citalopram Hydrobromide] Nausea And Vomiting  . Cortisone Swelling  . Cymbalta [Duloxetine Hcl] Nausea Only  . Eggs Or Egg-Derived Products Nausea And Vomiting  . Guaifenesin Er   . Keflex [Cephalexin] Swelling  . Motrin [Ibuprofen]   . Pumpkin Seed Nausea And Vomiting    All pumpkin products  . Sweet Potato Nausea And Vomiting       . Tea Nausea And Vomiting  . Zoloft [Sertraline] Nausea Only    Microbiology results: 7/31 UCx: Enterococcus faecalis   Thank you for allowing pharmacy to be a part of this patient's care.  Excell Seltzer Poteet 12/28/2016 12:19 PM

## 2016-12-28 NOTE — ED Notes (Signed)
Attempted report, unable to take at his time.

## 2016-12-28 NOTE — ED Notes (Signed)
Diet tray ordered 

## 2016-12-28 NOTE — ED Triage Notes (Signed)
Here for PICC line placement for at home antibiodics.  Had positive  urine culture.

## 2016-12-28 NOTE — H&P (Signed)
History and Physical  CARR SHARTZER POE:[id number] DOB: 1940/10/23 DOA: 12/28/2016  Referring physician: Dr Laverta Baltimore, ED physician PCP: Sinda Du, MD  Outpatient Specialists:   Patient Coming From: home  Chief Complaint: bladder infection, was sent for PICC line placement  HPI: Erik Atkins is a 76 y.o. male with a history of Charcot-Marie-Tooth muscular atrophy, COPD, neurogenic bladder, hypertension. Patient was diagnosed with UTI with vancomycin sensitive enterococcus on urine culture. The patient was being set up for home health vancomycin treatment, but needed PICC line placement. Patient presented to the hospital for PICC line. On repeat blood work, the patient had a white count of 16,000. Dr. Anastasio Champion, who was covering for his PCP, requested that the patient be observed overnight to ensure that his white count is decreasing prior to being sent home. The patient does admit to some intermittent nausea earlier today and yesterday, but no vomiting. His wife denies any confusion, hallucinations.  Emergency Department Course: PICC line placed  Review of Systems:   Pt denies any fevers, chills, nausea, vomiting, diarrhea, constipation, abdominal pain, shortness of breath, dyspnea on exertion, orthopnea, cough, wheezing, palpitations, headache, vision changes, lightheadedness, dizziness, melena, rectal bleeding.  Review of systems are otherwise negative  Past Medical History:  Diagnosis Date  . Anxiety   . Charcot Marie Tooth muscular atrophy   . COPD (chronic obstructive pulmonary disease) ([place])   . Hemorrhoids   . History of pneumonia   . Hypertension   . Nerve disorder 1996   "Charot Marie"  . Neuropathy   . Tubular adenoma    Past Surgical History:  Procedure Laterality Date  . CATARACT EXTRACTION, BILATERAL    . COLONOSCOPY  04/16/2010   Dr.Rourk- multiple cecal polyps, remainder of the colonic mucosa and rectal mucosa appeared normal. internal hemorrhoids bx= tubular  adenoma  . MASTECTOMY FOR GYNECOMASTIA  1979 and 1981  . POSTERIOR CERVICAL FUSION/FORAMINOTOMY  02/06/2012   Procedure: POSTERIOR CERVICAL FUSION/FORAMINOTOMY LEVEL 2;  Surgeon: Ophelia Charter, MD;  Location: [facility];  Service: Neurosurgery;  Laterality: N/A;  Posterior Cervical Three-Four/Four-Five Laminectomy and Fusion with Instrumentation  . TONSILLECTOMY     Social History:  reports that he has quit smoking. His smoking use included Cigarettes. He has a 25.00 pack-year smoking history. He has never used smokeless tobacco. He reports that he does not drink alcohol or use drugs. Patient lives at [facility]  . Penicillins     Has patient had a PCN reaction causing immediate rash, facial/tongue/throat swelling, SOB or lightheadedness with hypotension: Yes Has patient had a PCN reaction causing severe rash involving mucus membranes or skin necrosis: No Has patient had a PCN reaction that required hospitalization: No Has patient had a PCN reaction occurring within the last 10 years: No If all of the above answers are "NO", then may proceed with Cephalosporin use.   . Darvon Swelling  . Celexa [Citalopram Hydrobromide] Nausea And Vomiting  . Cortisone Swelling  . Cymbalta [Duloxetine Hcl] Nausea Only  . Eggs Or Egg-Derived Products Nausea And Vomiting  . Guaifenesin Er   . Keflex [Cephalexin] Swelling  . Motrin [Ibuprofen]   . Pumpkin Seed Nausea And Vomiting    All pumpkin products  . Sweet Potato Nausea And Vomiting       . Tea Nausea And Vomiting  . Zoloft [Sertraline] Nausea Only    History reviewed. No pertinent family history.   Prior to Admission medications   Medication Sig Start Date  End Date Taking? Authorizing Provider  ALPRAZolam (XANAX) 0.25 MG tablet Take 0.25 mg by mouth 5 (five) times daily as needed for anxiety.    Yes [provider]  apixaban (ELIQUIS) 2.5 MG TABS tablet Take 1 tablet (2.5 mg total) by mouth 2 (two)  times daily. 07/06/16  Yes Sinda Du, MD  CRANBERRY PO Take 1 tablet by mouth daily.   Yes [provider]  diltiazem (CARDIZEM LA) 240 MG 24 hr tablet Take 1 tablet (240 mg total) by mouth daily. 07/06/16  Yes Sinda Du, MD  Docusate Calcium (STOOL SOFTENER PO) Take 1 tablet by mouth 2 (two) times daily.   Yes [provider]  escitalopram (LEXAPRO) 10 MG tablet Take 1 tablet by mouth daily. 12/02/13  Yes [provider]  HYDROcodone-acetaminophen (NORCO) 10-325 MG tablet Take 1-2 tablets by mouth every 4 (four) hours as needed for pain. 10/15/16  Yes [provider]  lidocaine (XYLOCAINE) 2 % jelly Apply 1 application topically once. 10/01/16  Yes [provider]  Meth-Hyo-M Bl-Na Phos-Ph Sal (URIBEL) 118 MG CAPS Take 1 capsule by mouth every 6 (six) hours.   Yes [provider]  polyethylene glycol (MIRALAX / GLYCOLAX) packet Take 17 g by mouth daily as needed.   Yes [provider]  SPIRIVA HANDIHALER 18 MCG inhalation capsule Place 1 capsule into inhaler and inhale daily. 12/11/11  Yes [provider]  torsemide (DEMADEX) 100 MG tablet Take 0.5 tablets by mouth daily. 11/22/13  Yes [provider]  Tripelennamine HCl POWD Take 25 mg by mouth 2 (two) times daily.    Yes [provider]    Physical Exam: BP (!) 100/40   Pulse 72   Temp 97.7 F (36.5 C) (Oral)   Resp 18   Ht 5\' 5"  (1.651 m)   Wt 57.2 kg (126 lb)   SpO2 98%   BMI 20.97 kg/m   General: Elderly Caucasian male who appears cachectic. Awake and alert and oriented x3. No acute cardiopulmonary distress.  HEENT: Normocephalic atraumatic.  Right and left ears normal in appearance.  Pupils equal, round, reactive to light. Extraocular muscles are intact. Sclerae anicteric and noninjected.  Moist mucosal membranes. No mucosal lesions.  Neck: Neck supple without lymphadenopathy. No carotid bruits. No masses palpated.  Cardiovascular: Regular  rate with normal S1-S2 sounds. No murmurs, rubs, gallops auscultated. No JVD.  Respiratory: Good respiratory effort with no wheezes, rales, rhonchi. Lungs clear to auscultation bilaterally.  No accessory muscle use. Abdomen: Soft, nontender, nondistended. Active bowel sounds. No masses or hepatosplenomegaly  Skin: No rashes. Multiple bruises throughout. Some mild skin abrasions on his right hand and right leg. Dry, warm to touch. 2+ dorsalis pedis and radial pulses. Musculoskeletal: No calf or leg pain. All major joints not erythematous nontender.  No upper or lower joint deformation.  Good ROM.  Muscular atrophy diffusely. Psychiatric: Intact judgment and insight. Pleasant and cooperative. Neurologic: No focal neurological deficits. Strength is 5/5 and symmetric in upper and lower extremities.  Cranial nerves II through XII are grossly intact.           Labs on Admission: I have personally reviewed following labs and imaging studies  CBC:  Recent Labs Lab 12/28/16 1134  WBC 16.5*  NEUTROABS 14.0*  HGB 7.2*  HCT 21.5*  MCV 99.1  PLT 657   Basic Metabolic Panel:  Recent Labs Lab 12/28/16 1134  NA 136  K 4.9  CL 106  CO2 24  GLUCOSE 108*  BUN 47*  CREATININE 1.70*  CALCIUM 8.5*   GFR: Estimated Creatinine Clearance: 29.4 mL/min (A) (by C-G formula based on SCr of 1.7 mg/dL (H)). Liver Function Tests: No results for input(s): AST, ALT, ALKPHOS, BILITOT, PROT, ALBUMIN in the last 168 hours. No results for input(s): LIPASE, AMYLASE in the last 168 hours. No results for input(s): AMMONIA in the last 168 hours. Coagulation Profile:  Recent Labs Lab 12/28/16 1134  INR 1.58   Cardiac Enzymes: No results for input(s): CKTOTAL, CKMB, CKMBINDEX, TROPONINI in the last 168 hours. BNP (last 3 results) No results for input(s): PROBNP in the last 8760 hours. HbA1C: No results for input(s): HGBA1C in the last 72 hours. CBG: No results for input(s): GLUCAP in the last 168  hours. Lipid Profile: No results for input(s): CHOL, HDL, LDLCALC, TRIG, CHOLHDL, LDLDIRECT in the last 72 hours. Thyroid Function Tests: No results for input(s): TSH, T4TOTAL, FREET4, T3FREE, THYROIDAB in the last 72 hours. Anemia Panel: No results for input(s): VITAMINB12, FOLATE, FERRITIN, TIBC, IRON, RETICCTPCT in the last 72 hours. Urine analysis:    Component Value Date/Time   COLORURINE BROWN (A) 12/28/2016 1124   APPEARANCEUR TURBID (A) 12/28/2016 1124   LABSPEC 1.015 12/28/2016 1124   PHURINE 7.0 12/28/2016 1124   GLUCOSEU (A) 12/28/2016 1124    TEST NOT REPORTED DUE TO COLOR INTERFERENCE OF URINE PIGMENT   HGBUR (A) 12/28/2016 1124    TEST NOT REPORTED DUE TO COLOR INTERFERENCE OF URINE PIGMENT   BILIRUBINUR (A) 12/28/2016 1124    TEST NOT REPORTED DUE TO COLOR INTERFERENCE OF URINE PIGMENT   KETONESUR (A) 12/28/2016 1124    TEST NOT REPORTED DUE TO COLOR INTERFERENCE OF URINE PIGMENT   PROTEINUR (A) 12/28/2016 1124    TEST NOT REPORTED DUE TO COLOR INTERFERENCE OF URINE PIGMENT   UROBILINOGEN 2.0 (H) 02/03/2014 1442   NITRITE (A) 12/28/2016 1124    TEST NOT REPORTED DUE TO COLOR INTERFERENCE OF URINE PIGMENT   LEUKOCYTESUR (A) 12/28/2016 1124    TEST NOT REPORTED DUE TO COLOR INTERFERENCE OF URINE PIGMENT   Sepsis Labs: @LABRCNTIP (IPJASNKNLZJQB:3,ALPFXTKWIOX:7) ) Recent Results (from the past 240 hour(s))  Culture, Urine     Status: Abnormal   Collection Time: 12/24/16  1:30 PM  Result Value Ref Range Status   Specimen Description URINE, CATHETERIZED  Final   Special Requests NONE  Final   Culture ENTEROCOCCUS FAECALIS (A)  Final   Report Status 12/27/2016 FINAL  Final   Organism ID, Bacteria ENTEROCOCCUS FAECALIS  Final      Susceptibility   Enterococcus faecalis - MIC*    AMPICILLIN <=2 SENSITIVE Sensitive     LEVOFLOXACIN >=8 RESISTANT Resistant     NITROFURANTOIN 128 RESISTANT Resistant     VANCOMYCIN 1 SENSITIVE Sensitive     * ENTEROCOCCUS FAECALIS       Radiological Exams on Admission: Dg Chest Portable 1 View  Result Date: 12/28/2016 CLINICAL DATA:  PICC line placement EXAM: PORTABLE CHEST 1 VIEW COMPARISON:  Chest x-ray dated 06/27/2016. FINDINGS: Right-sided PICC line in place with tip adequately positioned at the level of the mid/lower SVC. Coarse lung markings again noted bilaterally suggesting chronic interstitial lung disease/ fibrosis. No pleural effusion or pneumothorax seen. No evidence of pneumonia. Heart size and mediastinal contours are stable. Atherosclerotic changes noted at the aortic arch. IMPRESSION: 1. Right-sided PICC line in place with tip adequately positioned at the level of the mid/lower SVC. No pneumothorax. 2. Chronic interstitial lung disease/fibrosis, stable. No evidence of  pneumonia or pulmonary edema. 3. Aortic atherosclerosis. Electronically Signed   By: Franki Cabot M.D.   On: 12/28/2016 13:33    Assessment/Plan: Principal Problem:   UTI (urinary tract infection) due to Enterococcus Active Problems:   HTN (hypertension)   Neurogenic bladder   Chronic indwelling Foley catheter   Congestive heart failure (HCC)   Atrial fibrillation, chronic ([place])    This patient was discussed with the ED physician, including pertinent vitals, physical exam findings, labs, and imaging.  We also discussed care given by the ED provider.  #1 UTI  Urine cultured in emergency apartment  Observe  Vancomycin  Repeat CBC tomorrow  Repeat BMP #2 neurogenic bladder #3 chronic indwelling Foley catheter  May need to exchange Foley catheter following infection #4 CHF  Compensated #5 chronic atrial fibrillation  CHADS 2 Vasc of 3  Continue anticoagulation #6 hypertension  Continue antihypertensives  DVT prophylaxis: Continue eliquis Consultants: None Code Status: Full code Family Communication: Wife in the room  Disposition Plan: Patient should be able to return home following observation   Truett Mainland,  DO [facility] Pager [phone]  If 7PM-7AM, please contact night-coverage [email or web address] Password [email or web address]

## 2016-12-28 NOTE — ED Notes (Signed)
Attempted report, unable to take at this time.  

## 2016-12-29 DIAGNOSIS — I4519 Other right bundle-branch block: Secondary | ICD-10-CM | POA: Diagnosis not present

## 2016-12-29 DIAGNOSIS — R627 Adult failure to thrive: Secondary | ICD-10-CM | POA: Diagnosis present

## 2016-12-29 DIAGNOSIS — E876 Hypokalemia: Secondary | ICD-10-CM | POA: Diagnosis present

## 2016-12-29 DIAGNOSIS — Z888 Allergy status to other drugs, medicaments and biological substances status: Secondary | ICD-10-CM | POA: Diagnosis not present

## 2016-12-29 DIAGNOSIS — J449 Chronic obstructive pulmonary disease, unspecified: Secondary | ICD-10-CM | POA: Diagnosis present

## 2016-12-29 DIAGNOSIS — G9341 Metabolic encephalopathy: Secondary | ICD-10-CM | POA: Diagnosis present

## 2016-12-29 DIAGNOSIS — J9611 Chronic respiratory failure with hypoxia: Secondary | ICD-10-CM | POA: Diagnosis not present

## 2016-12-29 DIAGNOSIS — I130 Hypertensive heart and chronic kidney disease with heart failure and stage 1 through stage 4 chronic kidney disease, or unspecified chronic kidney disease: Secondary | ICD-10-CM | POA: Diagnosis not present

## 2016-12-29 DIAGNOSIS — Z681 Body mass index (BMI) 19 or less, adult: Secondary | ICD-10-CM

## 2016-12-29 DIAGNOSIS — I482 Chronic atrial fibrillation: Secondary | ICD-10-CM | POA: Diagnosis present

## 2016-12-29 DIAGNOSIS — L89312 Pressure ulcer of right buttock, stage 2: Secondary | ICD-10-CM | POA: Diagnosis not present

## 2016-12-29 DIAGNOSIS — Z79891 Long term (current) use of opiate analgesic: Secondary | ICD-10-CM | POA: Diagnosis not present

## 2016-12-29 DIAGNOSIS — D62 Acute posthemorrhagic anemia: Secondary | ICD-10-CM | POA: Diagnosis not present

## 2016-12-29 DIAGNOSIS — Z91018 Allergy to other foods: Secondary | ICD-10-CM

## 2016-12-29 DIAGNOSIS — N390 Urinary tract infection, site not specified: Secondary | ICD-10-CM | POA: Diagnosis present

## 2016-12-29 DIAGNOSIS — Z7901 Long term (current) use of anticoagulants: Secondary | ICD-10-CM

## 2016-12-29 DIAGNOSIS — Z79899 Other long term (current) drug therapy: Secondary | ICD-10-CM

## 2016-12-29 DIAGNOSIS — Z87891 Personal history of nicotine dependence: Secondary | ICD-10-CM | POA: Diagnosis not present

## 2016-12-29 DIAGNOSIS — E43 Unspecified severe protein-calorie malnutrition: Secondary | ICD-10-CM | POA: Diagnosis present

## 2016-12-29 DIAGNOSIS — I4891 Unspecified atrial fibrillation: Secondary | ICD-10-CM | POA: Diagnosis not present

## 2016-12-29 DIAGNOSIS — D638 Anemia in other chronic diseases classified elsewhere: Secondary | ICD-10-CM | POA: Diagnosis present

## 2016-12-29 DIAGNOSIS — Z466 Encounter for fitting and adjustment of urinary device: Secondary | ICD-10-CM | POA: Diagnosis not present

## 2016-12-29 DIAGNOSIS — Z9181 History of falling: Secondary | ICD-10-CM | POA: Diagnosis not present

## 2016-12-29 DIAGNOSIS — R443 Hallucinations, unspecified: Secondary | ICD-10-CM | POA: Diagnosis not present

## 2016-12-29 DIAGNOSIS — Z91012 Allergy to eggs: Secondary | ICD-10-CM

## 2016-12-29 DIAGNOSIS — I509 Heart failure, unspecified: Secondary | ICD-10-CM | POA: Diagnosis not present

## 2016-12-29 DIAGNOSIS — Z66 Do not resuscitate: Secondary | ICD-10-CM | POA: Diagnosis present

## 2016-12-29 DIAGNOSIS — R1310 Dysphagia, unspecified: Secondary | ICD-10-CM | POA: Diagnosis present

## 2016-12-29 DIAGNOSIS — G600 Hereditary motor and sensory neuropathy: Secondary | ICD-10-CM | POA: Diagnosis present

## 2016-12-29 DIAGNOSIS — Z4800 Encounter for change or removal of nonsurgical wound dressing: Secondary | ICD-10-CM | POA: Diagnosis not present

## 2016-12-29 DIAGNOSIS — N189 Chronic kidney disease, unspecified: Secondary | ICD-10-CM | POA: Diagnosis present

## 2016-12-29 DIAGNOSIS — G8929 Other chronic pain: Secondary | ICD-10-CM | POA: Diagnosis present

## 2016-12-29 DIAGNOSIS — Z7401 Bed confinement status: Secondary | ICD-10-CM | POA: Diagnosis not present

## 2016-12-29 DIAGNOSIS — R410 Disorientation, unspecified: Secondary | ICD-10-CM | POA: Diagnosis not present

## 2016-12-29 DIAGNOSIS — Z452 Encounter for adjustment and management of vascular access device: Secondary | ICD-10-CM | POA: Diagnosis not present

## 2016-12-29 DIAGNOSIS — Z515 Encounter for palliative care: Secondary | ICD-10-CM | POA: Diagnosis present

## 2016-12-29 DIAGNOSIS — A419 Sepsis, unspecified organism: Secondary | ICD-10-CM | POA: Diagnosis not present

## 2016-12-29 DIAGNOSIS — Z9841 Cataract extraction status, right eye: Secondary | ICD-10-CM

## 2016-12-29 DIAGNOSIS — I5043 Acute on chronic combined systolic (congestive) and diastolic (congestive) heart failure: Secondary | ICD-10-CM | POA: Diagnosis not present

## 2016-12-29 DIAGNOSIS — N319 Neuromuscular dysfunction of bladder, unspecified: Secondary | ICD-10-CM | POA: Diagnosis present

## 2016-12-29 DIAGNOSIS — W1830XA Fall on same level, unspecified, initial encounter: Secondary | ICD-10-CM | POA: Diagnosis present

## 2016-12-29 DIAGNOSIS — R279 Unspecified lack of coordination: Secondary | ICD-10-CM | POA: Diagnosis not present

## 2016-12-29 DIAGNOSIS — J9809 Other diseases of bronchus, not elsewhere classified: Secondary | ICD-10-CM | POA: Diagnosis not present

## 2016-12-29 DIAGNOSIS — N179 Acute kidney failure, unspecified: Secondary | ICD-10-CM | POA: Diagnosis not present

## 2016-12-29 DIAGNOSIS — G710 Muscular dystrophy: Secondary | ICD-10-CM | POA: Diagnosis not present

## 2016-12-29 DIAGNOSIS — Z880 Allergy status to penicillin: Secondary | ICD-10-CM | POA: Diagnosis not present

## 2016-12-29 DIAGNOSIS — I10 Essential (primary) hypertension: Secondary | ICD-10-CM | POA: Diagnosis not present

## 2016-12-29 DIAGNOSIS — Z9842 Cataract extraction status, left eye: Secondary | ICD-10-CM

## 2016-12-29 DIAGNOSIS — Z981 Arthrodesis status: Secondary | ICD-10-CM

## 2016-12-29 DIAGNOSIS — I110 Hypertensive heart disease with heart failure: Secondary | ICD-10-CM | POA: Diagnosis not present

## 2016-12-29 LAB — BASIC METABOLIC PANEL
ANION GAP: 5 (ref 5–15)
BUN: 39 mg/dL — ABNORMAL HIGH (ref 6–20)
CO2: 25 mmol/L (ref 22–32)
Calcium: 8.2 mg/dL — ABNORMAL LOW (ref 8.9–10.3)
Chloride: 105 mmol/L (ref 101–111)
Creatinine, Ser: 1.41 mg/dL — ABNORMAL HIGH (ref 0.61–1.24)
GFR calc Af Amer: 54 mL/min — ABNORMAL LOW (ref >60)
GFR, EST NON AFRICAN AMERICAN: 47 mL/min — AB (ref >60)
Glucose, Bld: 105 mg/dL — ABNORMAL HIGH (ref 65–99)
POTASSIUM: 4.4 mmol/L (ref 3.5–5.1)
SODIUM: 135 mmol/L (ref 135–145)

## 2016-12-29 LAB — CBC
HCT: 22.9 % — ABNORMAL LOW (ref 39.0–52.0)
Hemoglobin: 7.6 g/dL — ABNORMAL LOW (ref 13.0–17.0)
MCH: 33 pg (ref 26.0–34.0)
MCHC: 33.2 g/dL (ref 30.0–36.0)
MCV: 99.6 fL (ref 78.0–100.0)
PLATELETS: 274 10*3/uL (ref 150–400)
RBC: 2.3 MIL/uL — AB (ref 4.22–5.81)
RDW: 14.4 % (ref 11.5–15.5)
WBC: 15.4 10*3/uL — AB (ref 4.0–10.5)

## 2016-12-29 NOTE — Progress Notes (Signed)
Subjective: Patient was admitted yesterday due to UTI. Patient is receiving IV antibiotics. He feels better today.  Objective: Vital signs in last 24 hours: Temp:  [97.4 F (36.3 C)-98.7 F (37.1 C)] 97.4 F (36.3 C) (08/05 0659) Pulse Rate:  [38-72] 58 (08/05 0659) Resp:  [16-21] 16 (08/05 0659) BP: (96-117)/(30-62) 107/53 (08/05 0659) SpO2:  [85 %-100 %] 96 % (08/05 0856) Weight:  [57.2 kg (126 lb)] 57.2 kg (126 lb) (08/04 1053) Weight change:     Intake/Output from previous day: 08/04 0701 - 08/05 0700 In: 240 [P.O.:240] Out: 1350 [Urine:1350]  PHYSICAL EXAM General appearance: alert and no distress Resp: clear to auscultation bilaterally Cardio: S1, S2 normal GI: soft, non-tender; bowel sounds normal; no masses,  no organomegaly Extremities: extremities normal, atraumatic, no cyanosis or edema  Lab Results:  Results for orders placed or performed during the hospital encounter of 12/28/16 (from the past 48 hour(s))  Urinalysis, Routine w reflex microscopic     Status: Abnormal   Collection Time: 12/28/16 11:24 AM  Result Value Ref Range   Color, Urine BROWN (A) YELLOW    Comment: BIOCHEMICALS MAY BE AFFECTED BY COLOR   APPearance TURBID (A) CLEAR   Specific Gravity, Urine 1.015 1.005 - 1.030   pH 7.0 5.0 - 8.0   Glucose, UA (A) NEGATIVE mg/dL    TEST NOT REPORTED DUE TO COLOR INTERFERENCE OF URINE PIGMENT   Hgb urine dipstick (A) NEGATIVE    TEST NOT REPORTED DUE TO COLOR INTERFERENCE OF URINE PIGMENT   Bilirubin Urine (A) NEGATIVE    TEST NOT REPORTED DUE TO COLOR INTERFERENCE OF URINE PIGMENT   Ketones, ur (A) NEGATIVE mg/dL    TEST NOT REPORTED DUE TO COLOR INTERFERENCE OF URINE PIGMENT   Protein, ur (A) NEGATIVE mg/dL    TEST NOT REPORTED DUE TO COLOR INTERFERENCE OF URINE PIGMENT   Nitrite (A) NEGATIVE    TEST NOT REPORTED DUE TO COLOR INTERFERENCE OF URINE PIGMENT   Leukocytes, UA (A) NEGATIVE    TEST NOT REPORTED DUE TO COLOR INTERFERENCE OF URINE  PIGMENT  Urinalysis, Microscopic (reflex)     Status: Abnormal   Collection Time: 12/28/16 11:24 AM  Result Value Ref Range   RBC / HPF TOO NUMEROUS TO COUNT 0 - 5 RBC/hpf   WBC, UA TOO NUMEROUS TO COUNT 0 - 5 WBC/hpf   Bacteria, UA MANY (A) NONE SEEN   Squamous Epithelial / LPF 0-5 (A) NONE SEEN  Basic metabolic panel     Status: Abnormal   Collection Time: 12/28/16 11:34 AM  Result Value Ref Range   Sodium 136 135 - 145 mmol/L   Potassium 4.9 3.5 - 5.1 mmol/L   Chloride 106 101 - 111 mmol/L   CO2 24 22 - 32 mmol/L   Glucose, Bld 108 (H) 65 - 99 mg/dL   BUN 47 (H) 6 - 20 mg/dL   Creatinine, Ser 7.55 (H) 0.61 - 1.24 mg/dL   Calcium 8.5 (L) 8.9 - 10.3 mg/dL   GFR calc non Af Amer 37 (L) >60 mL/min   GFR calc Af Amer 43 (L) >60 mL/min    Comment: (NOTE) The eGFR has been calculated using the CKD EPI equation. This calculation has not been validated in all clinical situations. eGFR's persistently <60 mL/min signify possible Chronic Kidney Disease.    Anion gap 6 5 - 15  CBC with Differential     Status: Abnormal   Collection Time: 12/28/16 11:34 AM  Result Value Ref  Range   WBC 16.5 (H) 4.0 - 10.5 K/uL   RBC 2.17 (L) 4.22 - 5.81 MIL/uL   Hemoglobin 7.2 (L) 13.0 - 17.0 g/dL   HCT 21.5 (L) 39.0 - 52.0 %   MCV 99.1 78.0 - 100.0 fL   MCH 33.2 26.0 - 34.0 pg   MCHC 33.5 30.0 - 36.0 g/dL   RDW 14.4 11.5 - 15.5 %   Platelets 256 150 - 400 K/uL   Neutrophils Relative % 85 %   Neutro Abs 14.0 (H) 1.7 - 7.7 K/uL   Lymphocytes Relative 7 %   Lymphs Abs 1.2 0.7 - 4.0 K/uL   Monocytes Relative 6 %   Monocytes Absolute 1.0 0.1 - 1.0 K/uL   Eosinophils Relative 2 %   Eosinophils Absolute 0.4 0.0 - 0.7 K/uL   Basophils Relative 0 %   Basophils Absolute 0.0 0.0 - 0.1 K/uL  Protime-INR     Status: Abnormal   Collection Time: 12/28/16 11:34 AM  Result Value Ref Range   Prothrombin Time 19.0 (H) 11.4 - 15.2 seconds   INR 1.58   CBC     Status: Abnormal   Collection Time: 12/29/16   6:06 AM  Result Value Ref Range   WBC 15.4 (H) 4.0 - 10.5 K/uL   RBC 2.30 (L) 4.22 - 5.81 MIL/uL   Hemoglobin 7.6 (L) 13.0 - 17.0 g/dL   HCT 22.9 (L) 39.0 - 52.0 %   MCV 99.6 78.0 - 100.0 fL   MCH 33.0 26.0 - 34.0 pg   MCHC 33.2 30.0 - 36.0 g/dL   RDW 14.4 11.5 - 15.5 %   Platelets 274 150 - 400 K/uL  Basic metabolic panel     Status: Abnormal   Collection Time: 12/29/16  6:06 AM  Result Value Ref Range   Sodium 135 135 - 145 mmol/L   Potassium 4.4 3.5 - 5.1 mmol/L   Chloride 105 101 - 111 mmol/L   CO2 25 22 - 32 mmol/L   Glucose, Bld 105 (H) 65 - 99 mg/dL   BUN 39 (H) 6 - 20 mg/dL   Creatinine, Ser 1.41 (H) 0.61 - 1.24 mg/dL   Calcium 8.2 (L) 8.9 - 10.3 mg/dL   GFR calc non Af Amer 47 (L) >60 mL/min   GFR calc Af Amer 54 (L) >60 mL/min    Comment: (NOTE) The eGFR has been calculated using the CKD EPI equation. This calculation has not been validated in all clinical situations. eGFR's persistently <60 mL/min signify possible Chronic Kidney Disease.    Anion gap 5 5 - 15    ABGS No results for input(s): PHART, PO2ART, TCO2, HCO3 in the last 72 hours.  Invalid input(s): PCO2 CULTURES Recent Results (from the past 240 hour(s))  Culture, Urine     Status: Abnormal   Collection Time: 12/24/16  1:30 PM  Result Value Ref Range Status   Specimen Description URINE, CATHETERIZED  Final   Special Requests NONE  Final   Culture ENTEROCOCCUS FAECALIS (A)  Final   Report Status 12/27/2016 FINAL  Final   Organism ID, Bacteria ENTEROCOCCUS FAECALIS  Final      Susceptibility   Enterococcus faecalis - MIC*    AMPICILLIN <=2 SENSITIVE Sensitive     LEVOFLOXACIN >=8 RESISTANT Resistant     NITROFURANTOIN 128 RESISTANT Resistant     VANCOMYCIN 1 SENSITIVE Sensitive     * ENTEROCOCCUS FAECALIS   Studies/Results: Dg Chest Portable 1 View  Result Date: 12/28/2016 CLINICAL DATA:  PICC line placement EXAM: PORTABLE CHEST 1 VIEW COMPARISON:  Chest x-ray dated 06/27/2016. FINDINGS:  Right-sided PICC line in place with tip adequately positioned at the level of the mid/lower SVC. Coarse lung markings again noted bilaterally suggesting chronic interstitial lung disease/ fibrosis. No pleural effusion or pneumothorax seen. No evidence of pneumonia. Heart size and mediastinal contours are stable. Atherosclerotic changes noted at the aortic arch. IMPRESSION: 1. Right-sided PICC line in place with tip adequately positioned at the level of the mid/lower SVC. No pneumothorax. 2. Chronic interstitial lung disease/fibrosis, stable. No evidence of pneumonia or pulmonary edema. 3. Aortic atherosclerosis. Electronically Signed   By: Franki Cabot M.D.   On: 12/28/2016 13:33    Medications: I have reviewed the patient's current medications.  Assesment:  Principal Problem:   UTI (urinary tract infection) due to Enterococcus Active Problems:   HTN (hypertension)   Neurogenic bladder   Chronic indwelling Foley catheter   Congestive heart failure (HCC)   Atrial fibrillation, chronic ([place])    Plan:  Medications reviewed Continue IV antibiotics Will follow culture result Continue regulaar treatment.    LOS: 0 days   Hiliary Osorto 12/29/2016, 10:46 AM

## 2016-12-30 DIAGNOSIS — N390 Urinary tract infection, site not specified: Secondary | ICD-10-CM | POA: Diagnosis not present

## 2016-12-30 LAB — URINE CULTURE: Special Requests: NORMAL

## 2016-12-30 MED ORDER — HEPARIN SOD (PORK) LOCK FLUSH 100 UNIT/ML IV SOLN
250.0000 [IU] | INTRAVENOUS | Status: AC | PRN
  Administered 2016-12-30: 250 [IU]
  Filled 2016-12-30: qty 5

## 2016-12-30 MED ORDER — VANCOMYCIN HCL 500 MG IV SOLR: 500.0000 mg | INTRAVENOUS | 0 refills | Status: DC

## 2016-12-30 NOTE — Discharge Summary (Signed)
Physician Discharge Summary  Patient ID: Erik Atkins MRN: [id number] DOB/AGE: January 10, 1941 76 y.o. Primary Care Physician:Hendryx Ricke, Percell Miller, MD Admit date: 12/28/2016 Discharge date: 12/30/2016    Discharge Diagnoses:   Principal Problem:   UTI (urinary tract infection) due to Enterococcus Active Problems:   HTN (hypertension)   Neurogenic bladder   Chronic indwelling Foley catheter   Congestive heart failure (HCC)   Atrial fibrillation, chronic (HCC) muscular dystrophy Anemia of chronic disease Allergies as of 12/30/2016      Reactions   Penicillins    Has patient had a PCN reaction causing immediate rash, facial/tongue/throat swelling, SOB or lightheadedness with hypotension: Yes Has patient had a PCN reaction causing severe rash involving mucus membranes or skin necrosis: No Has patient had a PCN reaction that required hospitalization: No Has patient had a PCN reaction occurring within the last 10 years: No If all of the above answers are "NO", then may proceed with Cephalosporin use.   Darvon Swelling   Celexa [citalopram Hydrobromide] Nausea And Vomiting   Cortisone Swelling   Cymbalta [duloxetine Hcl] Nausea Only   Eggs Or Egg-derived Products Nausea And Vomiting   Guaifenesin Er    Keflex [cephalexin] Swelling   Motrin [ibuprofen]    Pumpkin Seed Nausea And Vomiting   All pumpkin products   Sweet Potato Nausea And Vomiting      Tea Nausea And Vomiting   Zoloft [sertraline] Nausea Only      Medication List    TAKE these medications   ALPRAZolam 0.25 MG tablet Commonly known as:  XANAX Take 0.25 mg by mouth 5 (five) times daily as needed for anxiety.   apixaban 2.5 MG Tabs tablet Commonly known as:  ELIQUIS Take 1 tablet (2.5 mg total) by mouth 2 (two) times daily.   CRANBERRY PO Take 1 tablet by mouth daily.   diltiazem 240 MG 24 hr tablet Commonly known as:  CARDIZEM LA Take 1 tablet (240 mg total) by mouth daily.   escitalopram 10 MG tablet Commonly  known as:  LEXAPRO Take 1 tablet by mouth daily.   HYDROcodone-acetaminophen 10-325 MG tablet Commonly known as:  NORCO Take 1-2 tablets by mouth every 4 (four) hours as needed for pain.   lidocaine 2 % jelly Commonly known as:  XYLOCAINE Apply 1 application topically once.   polyethylene glycol packet Commonly known as:  MIRALAX / GLYCOLAX Take 17 g by mouth daily as needed.   SPIRIVA HANDIHALER 18 MCG inhalation capsule Generic drug:  tiotropium Place 1 capsule into inhaler and inhale daily.   STOOL SOFTENER PO Take 1 tablet by mouth 2 (two) times daily.   torsemide 100 MG tablet Commonly known as:  DEMADEX Take 0.5 tablets by mouth daily.   Tripelennamine HCl Powd Take 25 mg by mouth 2 (two) times daily.   URIBEL 118 MG Caps Take 1 capsule by mouth every 6 (six) hours.   vancomycin 500 mg in sodium chloride 0.9 % 100 mL Inject 500 mg into the vein daily.       Discharged Condition:Improved    Consults: None  Significant Diagnostic Studies: Dg Chest Portable 1 View  Result Date: 12/28/2016 CLINICAL DATA:  PICC line placement EXAM: PORTABLE CHEST 1 VIEW COMPARISON:  Chest x-ray dated 06/27/2016. FINDINGS: Right-sided PICC line in place with tip adequately positioned at the level of the mid/lower SVC. Coarse lung markings again noted bilaterally suggesting chronic interstitial lung disease/ fibrosis. No pleural effusion or pneumothorax seen. No evidence of pneumonia. Heart size  and mediastinal contours are stable. Atherosclerotic changes noted at the aortic arch. IMPRESSION: 1. Right-sided PICC line in place with tip adequately positioned at the level of the mid/lower SVC. No pneumothorax. 2. Chronic interstitial lung disease/fibrosis, stable. No evidence of pneumonia or pulmonary edema. 3. Aortic atherosclerosis. Electronically Signed   By: Franki Cabot M.D.   On: 12/28/2016 13:33    Lab Results: Basic Metabolic Panel:  Recent Labs  12/28/16 1134 12/29/16 0606   NA 136 135  K 4.9 4.4  CL 106 105  CO2 24 25  GLUCOSE 108* 105*  BUN 47* 39*  CREATININE 1.70* 1.41*  CALCIUM 8.5* 8.2*   Liver Function Tests: No results for input(s): AST, ALT, ALKPHOS, BILITOT, PROT, ALBUMIN in the last 72 hours.   CBC:  Recent Labs  12/28/16 1134 12/29/16 0606  WBC 16.5* 15.4*  NEUTROABS 14.0*  --   HGB 7.2* 7.6*  HCT 21.5* 22.9*  MCV 99.1 99.6  PLT 256 274    Recent Results (from the past 240 hour(s))  Culture, Urine     Status: Abnormal   Collection Time: 12/24/16  1:30 PM  Result Value Ref Range Status   Specimen Description URINE, CATHETERIZED  Final   Special Requests NONE  Final   Culture ENTEROCOCCUS FAECALIS (A)  Final   Report Status 12/27/2016 FINAL  Final   Organism ID, Bacteria ENTEROCOCCUS FAECALIS  Final      Susceptibility   Enterococcus faecalis - MIC*    AMPICILLIN <=2 SENSITIVE Sensitive     LEVOFLOXACIN >=8 RESISTANT Resistant     NITROFURANTOIN 128 RESISTANT Resistant     VANCOMYCIN 1 SENSITIVE Sensitive     * ENTEROCOCCUS FAECALIS  Urine culture     Status: Abnormal   Collection Time: 12/28/16 11:24 AM  Result Value Ref Range Status   Specimen Description URINE, CLEAN CATCH  Final   Special Requests Normal  Final   Culture >=100,000 COLONIES/mL ENTEROCOCCUS FAECALIS (A)  Final   Report Status 12/30/2016 FINAL  Final   Organism ID, Bacteria ENTEROCOCCUS FAECALIS (A)  Final      Susceptibility   Enterococcus faecalis - MIC*    AMPICILLIN <=2 SENSITIVE Sensitive     LEVOFLOXACIN >=8 RESISTANT Resistant     NITROFURANTOIN 128 RESISTANT Resistant     VANCOMYCIN 1 SENSITIVE Sensitive     * >=100,000 COLONIES/mL ENTEROCOCCUS FAECALIS     Hospital Course: This is a 76 year old who came to the emergency department to have a PICC line placed. He has enterococcus in his urine which is multiply resistant and is going to require IV vancomycin. When he came to the emergency department his white blood cell count was elevated and  he was brought in for observation. PICC line was placed he was started on IV vancomycin and he is back at baseline now. His white blood count is still elevated.  Discharge Exam: Blood pressure (!) 100/35, pulse 77, temperature 97.7 F (36.5 C), temperature source Oral, resp. rate 16, height 5\' 5"  (1.651 m), weight 57.2 kg (126 lb), SpO2 100 %. He's awake and alert. Chest is clear. Heart is irregular  Disposition: Home with home health services  Discharge Instructions    Face-to-face encounter (required for Medicare/Medicaid patients)    Complete by:  As directed    I Rayya Yagi L certify that this patient is under my care and that I, or a nurse practitioner or physician's assistant working with me, had a face-to-face encounter that meets the physician face-to-face  encounter requirements with this patient on 12/30/2016. The encounter with the patient was in whole, or in part for the following medical condition(s) which is the primary reason for home health care (List medical condition): Urinary tract infection/muscular dystrophy   The encounter with the patient was in whole, or in part, for the following medical condition, which is the primary reason for home health care:  Urinary tract infection/muscular dystrophy   I certify that, based on my findings, the following services are medically necessary home health services:   Nursing Physical therapy     Reason for Medically Necessary Home Health Services:  Skilled Nursing- Change/Decline in Patient Status   My clinical findings support the need for the above services:  Unable to leave home safely without assistance and/or assistive device   Further, I certify that my clinical findings support that this patient is homebound due to:  Unable to leave home safely without assistance   Home Health    Complete by:  As directed    To provide the following care/treatments:   PT RN          Signed: Mckinzy Fuller L   12/30/2016, 8:49 AM

## 2016-12-30 NOTE — Care Management (Signed)
Home health arranged/confirmed with [facility]. EMS transport arranged for 2 pm.

## 2016-12-30 NOTE — Progress Notes (Signed)
He was brought in for observation after going to the emergency department for a PICC line. He was found to have an elevated white blood cell count and was brought in for observation to see how he did. He has no complaints. He's on IV vancomycin. He does have a PICC line. I don't think he needs to stay in the hospital as plans were initially for him to receive outpatient IV vancomycin. We'll plan to discharge home today

## 2016-12-30 NOTE — Care Management Note (Signed)
Case Management Note  Patient Details  Name: Erik Atkins MRN: [id number] Date of Birth: [birth year]     Expected Discharge Date:  12/30/16               Expected Discharge Plan:  [place]  In-House Referral:     Discharge planning Services  CM Consult  Post Acute Care Choice:  Home Health, Resumption of Svcs/PTA Provider Choice offered to:  Patient  DME Arranged:    DME Agency:     HH Arranged:  RN, PT [place] Agency:  [facility]  Status of Service:  Completed, signed off  If discussed at [place] of Stay Meetings, dates discussed:    Additional Comments: Patient discharging home today with IV antibiotics. Per wife patient is active with [facility]. [name] of [facility] to verify patient is active. IV vanc ordered per Dr. Luan Pulling. Home Health orders placed by Dr. Luan Pulling. Per wife patient will need EMS transport home. Today's does of Vanc will be given in hospital prior to Discharge. Daily doses to then be given by [facility] agency. [facility] plans to obtain IV antibiotics.   Erik Atkins, [name], RN 12/30/2016, 9:42 AM

## 2016-12-30 NOTE — Progress Notes (Signed)
Heparin locked PICC line.   Reviewed discharge instructions with pt and wife at bedside, answered questions.  Pt belongings sent with wife.  EMS transported pt home.

## 2016-12-31 DIAGNOSIS — Z87891 Personal history of nicotine dependence: Secondary | ICD-10-CM | POA: Diagnosis not present

## 2016-12-31 DIAGNOSIS — J449 Chronic obstructive pulmonary disease, unspecified: Secondary | ICD-10-CM | POA: Diagnosis not present

## 2016-12-31 DIAGNOSIS — I4891 Unspecified atrial fibrillation: Secondary | ICD-10-CM | POA: Diagnosis not present

## 2016-12-31 DIAGNOSIS — L89312 Pressure ulcer of right buttock, stage 2: Secondary | ICD-10-CM | POA: Diagnosis not present

## 2016-12-31 DIAGNOSIS — Z79891 Long term (current) use of opiate analgesic: Secondary | ICD-10-CM | POA: Diagnosis not present

## 2016-12-31 DIAGNOSIS — I110 Hypertensive heart disease with heart failure: Secondary | ICD-10-CM | POA: Diagnosis not present

## 2016-12-31 DIAGNOSIS — Z7901 Long term (current) use of anticoagulants: Secondary | ICD-10-CM | POA: Diagnosis not present

## 2016-12-31 DIAGNOSIS — Z9181 History of falling: Secondary | ICD-10-CM | POA: Diagnosis not present

## 2016-12-31 DIAGNOSIS — I509 Heart failure, unspecified: Secondary | ICD-10-CM | POA: Diagnosis not present

## 2016-12-31 DIAGNOSIS — Z4800 Encounter for change or removal of nonsurgical wound dressing: Secondary | ICD-10-CM | POA: Diagnosis not present

## 2016-12-31 DIAGNOSIS — N390 Urinary tract infection, site not specified: Secondary | ICD-10-CM | POA: Diagnosis not present

## 2016-12-31 DIAGNOSIS — N319 Neuromuscular dysfunction of bladder, unspecified: Secondary | ICD-10-CM | POA: Diagnosis not present

## 2016-12-31 DIAGNOSIS — J9611 Chronic respiratory failure with hypoxia: Secondary | ICD-10-CM | POA: Diagnosis not present

## 2016-12-31 DIAGNOSIS — G9341 Metabolic encephalopathy: Secondary | ICD-10-CM | POA: Diagnosis not present

## 2016-12-31 DIAGNOSIS — Z466 Encounter for fitting and adjustment of urinary device: Secondary | ICD-10-CM | POA: Diagnosis not present

## 2017-01-01 ENCOUNTER — Emergency Department (HOSPITAL_COMMUNITY): Payer: Medicare Other

## 2017-01-01 ENCOUNTER — Inpatient Hospital Stay (HOSPITAL_COMMUNITY)
Admission: EM | Admit: 2017-01-01 | Discharge: 2017-01-07 | DRG: 871 | Disposition: A | Discharge status: Hospice | Payer: Medicare Other | Attending: Pulmonary Disease | Admitting: Pulmonary Disease

## 2017-01-01 ENCOUNTER — Encounter (HOSPITAL_COMMUNITY): Payer: Self-pay | Admitting: *Deleted

## 2017-01-01 DIAGNOSIS — Z91018 Allergy to other foods: Secondary | ICD-10-CM | POA: Diagnosis not present

## 2017-01-01 DIAGNOSIS — I130 Hypertensive heart and chronic kidney disease with heart failure and stage 1 through stage 4 chronic kidney disease, or unspecified chronic kidney disease: Secondary | ICD-10-CM | POA: Diagnosis present

## 2017-01-01 DIAGNOSIS — R410 Disorientation, unspecified: Secondary | ICD-10-CM | POA: Diagnosis not present

## 2017-01-01 DIAGNOSIS — D649 Anemia, unspecified: Secondary | ICD-10-CM | POA: Diagnosis not present

## 2017-01-01 DIAGNOSIS — W1830XA Fall on same level, unspecified, initial encounter: Secondary | ICD-10-CM | POA: Diagnosis present

## 2017-01-01 DIAGNOSIS — Z452 Encounter for adjustment and management of vascular access device: Secondary | ICD-10-CM | POA: Diagnosis not present

## 2017-01-01 DIAGNOSIS — D62 Acute posthemorrhagic anemia: Secondary | ICD-10-CM | POA: Diagnosis present

## 2017-01-01 DIAGNOSIS — N390 Urinary tract infection, site not specified: Secondary | ICD-10-CM | POA: Diagnosis not present

## 2017-01-01 DIAGNOSIS — A419 Sepsis, unspecified organism: Principal | ICD-10-CM | POA: Diagnosis present

## 2017-01-01 DIAGNOSIS — G710 Muscular dystrophy: Secondary | ICD-10-CM | POA: Diagnosis present

## 2017-01-01 DIAGNOSIS — N319 Neuromuscular dysfunction of bladder, unspecified: Secondary | ICD-10-CM | POA: Diagnosis present

## 2017-01-01 DIAGNOSIS — T82898A Other specified complication of vascular prosthetic devices, implants and grafts, initial encounter: Secondary | ICD-10-CM | POA: Diagnosis not present

## 2017-01-01 DIAGNOSIS — N179 Acute kidney failure, unspecified: Secondary | ICD-10-CM

## 2017-01-01 DIAGNOSIS — Z515 Encounter for palliative care: Secondary | ICD-10-CM

## 2017-01-01 DIAGNOSIS — J449 Chronic obstructive pulmonary disease, unspecified: Secondary | ICD-10-CM | POA: Diagnosis present

## 2017-01-01 DIAGNOSIS — I4891 Unspecified atrial fibrillation: Secondary | ICD-10-CM

## 2017-01-01 DIAGNOSIS — Z880 Allergy status to penicillin: Secondary | ICD-10-CM | POA: Diagnosis not present

## 2017-01-01 DIAGNOSIS — R279 Unspecified lack of coordination: Secondary | ICD-10-CM | POA: Diagnosis not present

## 2017-01-01 DIAGNOSIS — G9341 Metabolic encephalopathy: Secondary | ICD-10-CM

## 2017-01-01 DIAGNOSIS — Z7189 Other specified counseling: Secondary | ICD-10-CM

## 2017-01-01 DIAGNOSIS — E43 Unspecified severe protein-calorie malnutrition: Secondary | ICD-10-CM | POA: Diagnosis present

## 2017-01-01 DIAGNOSIS — N189 Chronic kidney disease, unspecified: Secondary | ICD-10-CM | POA: Diagnosis present

## 2017-01-01 DIAGNOSIS — J9690 Respiratory failure, unspecified, unspecified whether with hypoxia or hypercapnia: Secondary | ICD-10-CM | POA: Diagnosis not present

## 2017-01-01 DIAGNOSIS — Z681 Body mass index (BMI) 19 or less, adult: Secondary | ICD-10-CM | POA: Diagnosis not present

## 2017-01-01 DIAGNOSIS — I5043 Acute on chronic combined systolic (congestive) and diastolic (congestive) heart failure: Secondary | ICD-10-CM | POA: Diagnosis present

## 2017-01-01 DIAGNOSIS — G8929 Other chronic pain: Secondary | ICD-10-CM | POA: Diagnosis present

## 2017-01-01 DIAGNOSIS — Z888 Allergy status to other drugs, medicaments and biological substances status: Secondary | ICD-10-CM | POA: Diagnosis not present

## 2017-01-01 DIAGNOSIS — Z79899 Other long term (current) drug therapy: Secondary | ICD-10-CM | POA: Diagnosis not present

## 2017-01-01 DIAGNOSIS — R443 Hallucinations, unspecified: Secondary | ICD-10-CM | POA: Diagnosis not present

## 2017-01-01 DIAGNOSIS — D638 Anemia in other chronic diseases classified elsewhere: Secondary | ICD-10-CM | POA: Diagnosis present

## 2017-01-01 DIAGNOSIS — Z87891 Personal history of nicotine dependence: Secondary | ICD-10-CM | POA: Diagnosis not present

## 2017-01-01 DIAGNOSIS — J9809 Other diseases of bronchus, not elsewhere classified: Secondary | ICD-10-CM | POA: Diagnosis not present

## 2017-01-01 DIAGNOSIS — G600 Hereditary motor and sensory neuropathy: Secondary | ICD-10-CM | POA: Diagnosis present

## 2017-01-01 DIAGNOSIS — I4519 Other right bundle-branch block: Secondary | ICD-10-CM | POA: Diagnosis present

## 2017-01-01 DIAGNOSIS — Z91012 Allergy to eggs: Secondary | ICD-10-CM | POA: Diagnosis not present

## 2017-01-01 DIAGNOSIS — I4821 Permanent atrial fibrillation: Secondary | ICD-10-CM | POA: Diagnosis present

## 2017-01-01 DIAGNOSIS — L8990 Pressure ulcer of unspecified site, unspecified stage: Secondary | ICD-10-CM | POA: Diagnosis present

## 2017-01-01 DIAGNOSIS — Z743 Need for continuous supervision: Secondary | ICD-10-CM | POA: Diagnosis not present

## 2017-01-01 DIAGNOSIS — Z7901 Long term (current) use of anticoagulants: Secondary | ICD-10-CM | POA: Diagnosis not present

## 2017-01-01 LAB — COMPREHENSIVE METABOLIC PANEL
ALBUMIN: 3.4 g/dL — AB (ref 3.5–5.0)
ALT: 14 U/L — AB (ref 17–63)
AST: 32 U/L (ref 15–41)
Alkaline Phosphatase: 56 U/L (ref 38–126)
Anion gap: 14 (ref 5–15)
BUN: 55 mg/dL — AB (ref 6–20)
CHLORIDE: 101 mmol/L (ref 101–111)
CO2: 20 mmol/L — ABNORMAL LOW (ref 22–32)
Calcium: 8.6 mg/dL — ABNORMAL LOW (ref 8.9–10.3)
Creatinine, Ser: 2.37 mg/dL — ABNORMAL HIGH (ref 0.61–1.24)
GFR calc Af Amer: 29 mL/min — ABNORMAL LOW (ref >60)
GFR calc non Af Amer: 25 mL/min — ABNORMAL LOW (ref >60)
GLUCOSE: 136 mg/dL — AB (ref 65–99)
POTASSIUM: 4 mmol/L (ref 3.5–5.1)
Sodium: 135 mmol/L (ref 135–145)
Total Bilirubin: 0.9 mg/dL (ref 0.3–1.2)
Total Protein: 6.9 g/dL (ref 6.5–8.1)

## 2017-01-01 LAB — URINALYSIS, ROUTINE W REFLEX MICROSCOPIC
GLUCOSE, UA: 100 mg/dL — AB
KETONES UR: 15 mg/dL — AB
Nitrite: POSITIVE — AB
PH: 7 (ref 5.0–8.0)
Protein, ur: >300 mg/dL — AB
Specific Gravity, Urine: 1.015 (ref 1.005–1.030)

## 2017-01-01 LAB — CBC WITH DIFFERENTIAL/PLATELET
BASOS ABS: 0 10*3/uL (ref 0.0–0.1)
Basophils Relative: 0 %
Eosinophils Absolute: 0 10*3/uL (ref 0.0–0.7)
Eosinophils Relative: 0 %
HEMATOCRIT: 26.4 % — AB (ref 39.0–52.0)
Hemoglobin: 8 g/dL — ABNORMAL LOW (ref 13.0–17.0)
LYMPHS ABS: 0.6 10*3/uL — AB (ref 0.7–4.0)
Lymphocytes Relative: 2 %
MCH: 32.9 pg (ref 26.0–34.0)
MCHC: 30.3 g/dL (ref 30.0–36.0)
MCV: 108.6 fL — ABNORMAL HIGH (ref 78.0–100.0)
MONO ABS: 1.9 10*3/uL — AB (ref 0.1–1.0)
MONOS PCT: 6 %
NEUTROS PCT: 92 %
Neutro Abs: 29.7 10*3/uL — ABNORMAL HIGH (ref 1.7–7.7)
PLATELETS: 330 10*3/uL (ref 150–400)
RBC: 2.43 MIL/uL — AB (ref 4.22–5.81)
RDW: 15.1 % (ref 11.5–15.5)
WBC: 32.2 10*3/uL — AB (ref 4.0–10.5)

## 2017-01-01 LAB — PROTIME-INR
INR: 1.92
PROTHROMBIN TIME: 22.2 s — AB (ref 11.4–15.2)

## 2017-01-01 LAB — APTT: aPTT: 45 seconds — ABNORMAL HIGH (ref 24–36)

## 2017-01-01 LAB — MRSA PCR SCREENING: MRSA by PCR: NEGATIVE

## 2017-01-01 LAB — URINALYSIS, MICROSCOPIC (REFLEX)

## 2017-01-01 LAB — I-STAT CG4 LACTIC ACID, ED
Lactic Acid, Venous: 1.66 mmol/L (ref 0.5–1.9)
Lactic Acid, Venous: 1.97 mmol/L — ABNORMAL HIGH (ref 0.5–1.9)

## 2017-01-01 LAB — VANCOMYCIN, RANDOM: VANCOMYCIN RM: 16

## 2017-01-01 LAB — PROCALCITONIN: PROCALCITONIN: 0.2 ng/mL

## 2017-01-01 MED ORDER — DOCUSATE SODIUM 100 MG PO CAPS
100.0000 mg | ORAL_CAPSULE | Freq: Two times a day (BID) | ORAL | Status: DC
  Administered 2017-01-01 – 2017-01-04 (×6): 100 mg via ORAL
  Filled 2017-01-01 (×6): qty 1

## 2017-01-01 MED ORDER — ONDANSETRON HCL 4 MG/2ML IJ SOLN: 4.0000 mg | Freq: Four times a day (QID) | INTRAMUSCULAR | Status: DC | PRN

## 2017-01-01 MED ORDER — LINEZOLID 600 MG PO TABS
600.0000 mg | ORAL_TABLET | Freq: Two times a day (BID) | ORAL | Status: DC
  Administered 2017-01-01 – 2017-01-04 (×7): 600 mg via ORAL
  Filled 2017-01-01 (×10): qty 1

## 2017-01-01 MED ORDER — TRIPELENNAMINE HCL POWD
25.0000 mg | Freq: Two times a day (BID) | Status: DC
  Filled 2017-01-01: qty 1

## 2017-01-01 MED ORDER — HYDROCODONE-ACETAMINOPHEN 10-325 MG PO TABS
1.0000 | ORAL_TABLET | ORAL | Status: DC | PRN
  Administered 2017-01-01 – 2017-01-03 (×4): 2 via ORAL
  Administered 2017-01-03: 1 via ORAL
  Administered 2017-01-03 – 2017-01-04 (×2): 2 via ORAL
  Filled 2017-01-01 (×3): qty 2
  Filled 2017-01-01: qty 1
  Filled 2017-01-01 (×3): qty 2

## 2017-01-01 MED ORDER — ONDANSETRON HCL 4 MG PO TABS: 4.0000 mg | ORAL_TABLET | Freq: Four times a day (QID) | ORAL | Status: DC | PRN

## 2017-01-01 MED ORDER — ACETAMINOPHEN 325 MG PO TABS: 650.0000 mg | ORAL_TABLET | Freq: Four times a day (QID) | ORAL | Status: DC | PRN

## 2017-01-01 MED ORDER — TIOTROPIUM BROMIDE MONOHYDRATE 18 MCG IN CAPS
1.0000 | ORAL_CAPSULE | Freq: Every day | RESPIRATORY_TRACT | Status: DC
  Administered 2017-01-02 – 2017-01-06 (×5): 18 ug via RESPIRATORY_TRACT
  Filled 2017-01-01 (×2): qty 5

## 2017-01-01 MED ORDER — DILTIAZEM HCL 100 MG IV SOLR
5.0000 mg/h | INTRAVENOUS | Status: DC
  Administered 2017-01-01: 5 mg/h via INTRAVENOUS
  Administered 2017-01-02 (×2): 10 mg/h via INTRAVENOUS
  Administered 2017-01-03: 5 mg/h via INTRAVENOUS
  Administered 2017-01-03: 7.5 mg/h via INTRAVENOUS
  Administered 2017-01-04 – 2017-01-05 (×2): 10 mg/h via INTRAVENOUS
  Administered 2017-01-05 – 2017-01-06 (×2): 12.5 mg/h via INTRAVENOUS
  Administered 2017-01-06: 15 mg/h via INTRAVENOUS
  Filled 2017-01-01 (×13): qty 100

## 2017-01-01 MED ORDER — DEXTROSE 5 % IV SOLN
1.0000 g | Freq: Three times a day (TID) | INTRAVENOUS | Status: DC
  Administered 2017-01-01 – 2017-01-05 (×11): 1 g via INTRAVENOUS
  Filled 2017-01-01 (×15): qty 1

## 2017-01-01 MED ORDER — LINEZOLID 600 MG/300ML IV SOLN
600.0000 mg | Freq: Two times a day (BID) | INTRAVENOUS | Status: DC
Start: 2017-01-01 — End: 2017-01-01

## 2017-01-01 MED ORDER — ACETAMINOPHEN 650 MG RE SUPP: 650.0000 mg | Freq: Four times a day (QID) | RECTAL | Status: DC | PRN

## 2017-01-01 MED ORDER — URIBEL 118 MG PO CAPS
1.0000 | ORAL_CAPSULE | Freq: Four times a day (QID) | ORAL | Status: DC
  Administered 2017-01-04 (×2): 118 mg via ORAL
  Filled 2017-01-01: qty 1

## 2017-01-01 MED ORDER — ALPRAZOLAM 0.25 MG PO TABS
0.2500 mg | ORAL_TABLET | Freq: Every day | ORAL | Status: DC | PRN
Start: 2017-01-01 — End: 2017-01-07
  Administered 2017-01-02 – 2017-01-05 (×6): 0.25 mg via ORAL
  Filled 2017-01-01 (×6): qty 1

## 2017-01-01 MED ORDER — LINEZOLID 600 MG/300ML IV SOLN
600.0000 mg | Freq: Once | INTRAVENOUS | Status: AC
  Administered 2017-01-01: 600 mg via INTRAVENOUS
  Filled 2017-01-01: qty 300

## 2017-01-01 MED ORDER — SODIUM CHLORIDE 0.9 % IV SOLN
INTRAVENOUS | Status: DC
Start: 2017-01-01 — End: 2017-01-07
  Administered 2017-01-01: 14:00:00 via INTRAVENOUS
  Administered 2017-01-03: 1000 mL via INTRAVENOUS
  Administered 2017-01-04: 75 mL via INTRAVENOUS
  Administered 2017-01-04 – 2017-01-05 (×2): 1 mL via INTRAVENOUS
  Administered 2017-01-06: 10:00:00 via INTRAVENOUS

## 2017-01-01 MED ORDER — DEXTROSE 5 % IV SOLN
1.0000 g | Freq: Once | INTRAVENOUS | Status: AC
  Administered 2017-01-01: 1 g via INTRAVENOUS
  Filled 2017-01-01: qty 1

## 2017-01-01 MED ORDER — SODIUM CHLORIDE 0.9 % IV BOLUS (SEPSIS)
500.0000 mL | Freq: Once | INTRAVENOUS | Status: AC
  Administered 2017-01-01: 500 mL via INTRAVENOUS

## 2017-01-01 MED ORDER — VANCOMYCIN HCL IN DEXTROSE 1-5 GM/200ML-% IV SOLN
1000.0000 mg | Freq: Once | INTRAVENOUS | Status: DC
  Filled 2017-01-01: qty 200

## 2017-01-01 NOTE — ED Triage Notes (Signed)
Pt comes in from home for a PICC line problem. Family is worried his PICC line has come out. There is some blood under tegaderm. Also, pt has catheter in place and states he is having pain in his penis. NAD noted. Pt is alert.

## 2017-01-01 NOTE — ED Notes (Signed)
Pt has feces on the hubs of the PICC line.

## 2017-01-01 NOTE — Progress Notes (Signed)
Pharmacy Antibiotic Note  Erik Atkins is a 76 y.o. male admitted on 01/01/2017 with sepsis.  Pharmacy has been consulted for AZTREONAM dosing.  Plan: Aztreonam 1gm IV q8hrs Monitor labs, progress, c/s  Height: 5\' 5"  (165.1 cm) Weight: 108 lb 0.4 oz (49 kg) IBW/kg (Calculated) : 61.5  Temp (24hrs), Avg:97.9 F (36.6 C), Min:97.8 F (36.6 C), Max:97.9 F (36.6 C)   Recent Labs Lab 12/28/16 1134 12/29/16 0606 01/01/17 0849 01/01/17 1110 01/01/17 1151  WBC 16.5* 15.4* 32.2*  --   --   CREATININE 1.70* 1.41* 2.37*  --   --   LATICACIDVEN  --   --   --  1.66 1.97*    Estimated Creatinine Clearance: 18.1 mL/min (A) (by C-G formula based on SCr of 2.37 mg/dL (H)).    Allergies  Allergen Reactions  . Penicillins     Has patient had a PCN reaction causing immediate rash, facial/tongue/throat swelling, SOB or lightheadedness with hypotension: Yes Has patient had a PCN reaction causing severe rash involving mucus membranes or skin necrosis: No Has patient had a PCN reaction that required hospitalization: No Has patient had a PCN reaction occurring within the last 10 years: No If all of the above answers are "NO", then may proceed with Cephalosporin use.   . Darvon Swelling  . Celexa [Citalopram Hydrobromide] Nausea And Vomiting  . Cortisone Swelling  . Cymbalta [Duloxetine Hcl] Nausea Only  . Eggs Or Egg-Derived Products Nausea And Vomiting  . Guaifenesin Er   . Keflex [Cephalexin] Swelling  . Motrin [Ibuprofen]   . Pumpkin Seed Nausea And Vomiting    All pumpkin products  . Sweet Potato Nausea And Vomiting       . Tea Nausea And Vomiting  . Zoloft [Sertraline] Nausea Only   Antimicrobials this admission: Aztreonam 8/8 >>  Linezolid  8/8 >> Vancomycin 8/8 x 1  Dose adjustments this admission:  Microbiology results:  BCx: pending  UCx: pending   Sputum:    MRSA PCR:   Thank you for allowing pharmacy to be a part of this patient's care.  [name]  A 01/01/2017 1:33 PM

## 2017-01-01 NOTE — ED Notes (Signed)
Bladder scan showed 251ml of urine. Urine leaking out around foley catheter. Family member stated that it was a size 18Fr.

## 2017-01-01 NOTE — ED Notes (Signed)
Report given to Lawrence RN

## 2017-01-01 NOTE — H&P (Signed)
History and Physical  Erik Atkins WJX:[id number] DOB: January 26, 1941 DOA: 01/01/2017   PCP: Sinda Du, MD   Patient coming from: Home  Chief Complaint: confusion  HPI:  Erik Atkins is a 76 y.o. male with medical history of COPD, Charcot-Marie-Tooth muscular atrophy, hypertension, and atrial fibrillation presenting with confusion for the past 1-2 days. The patient was recently discharged from the hospital after a stay from 12/28/2016 through 12/30/2016. Throughout hospitalization, the patient was treated for Enterococcus faecalis UTI. Because of his allergy to penicillin, the patient was sent home with intravenous vancomycin. Soon after discharge home, the patient's wife noted increasing lethargy and confusion. The patient also had decreased oral intake. There were subjective fevers and chills. There was no reports of nausea, vomiting, diarrhea, headache, neck pain. There was some intermittent suprapubic pain. In addition, his wife also noted some blood in the patient's diaper which she presumes to be from his urine. The patient was seen by his [facility] whom recommended the patient be brought to the emergency department for further evaluation. In addition, the patient had a mechanical fall on the evening of 12/31/2016. He has had unsteadiness on his feet.  In the emergency department, the patient was noted to have atrial fibrillation with RVR with heart rate in 120s.  His initial blood pressure was soft with systolic blood pressure in the 90s. Oxygen saturation was 98% on room air. Serum creatinine was 2.37 with WBC 32.2.  The patient was started on Zyvox and asked [place] after blood cultures and urine cultures were obtained.  Assessment/Plan: Sepsis -Presumably secondary to UTI although concerned about bacteremia in the setting of PICC line -in the setting of his PCN allergy, start patient on Zyvox and aztreonam pending culture data -Lactic acid 1.66 -Check procalcitonin -Chest x-ray  with peribronchial thickening -Continue IV fluids  Acute on chronic renal failure -Secondary to sepsis although I am concerned about vancomycin nephrotoxicity -Check random vancomycin level -Continue IV fluids -Repeat BMP in the morning -Holding torsemide  Presumptive hematuria -Wife noted blood in the patient's diaper -Holding apixiban temporarily  -Check Hemoccult -Further workup if no improvement  Atrial fibrillation with RVR -Secondary to sepsis -Start diltiazem drip -Suspect that he will be able to transition to oral diltiazem fairly quickly  Acute metabolic encephalopathy -Secondary to patient's infectious process  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 Anxiety   . Charcot Marie Tooth muscular atrophy   . COPD (chronic obstructive pulmonary disease) ([place])   . Hemorrhoids   . History of pneumonia   . Hypertension   . Nerve disorder 1996   "Charot Marie"  . Neuropathy   . Tubular adenoma    Past Surgical History:  Procedure Laterality Date  . CATARACT EXTRACTION, BILATERAL    . COLONOSCOPY  04/16/2010   Dr.Rourk- multiple cecal polyps, remainder of the colonic mucosa and rectal mucosa appeared normal. internal hemorrhoids bx= tubular adenoma  . MASTECTOMY FOR GYNECOMASTIA  1979 and 1981  . POSTERIOR CERVICAL FUSION/FORAMINOTOMY  02/06/2012   Procedure: POSTERIOR CERVICAL FUSION/FORAMINOTOMY LEVEL 2;  Surgeon: Ophelia Charter, MD;  Location: [place] [facility];  Service: Neurosurgery;  Laterality: N/A;  Posterior Cervical Three-Four/Four-Five Laminectomy and Fusion with Instrumentation  . TONSILLECTOMY     Social History:  reports that he has quit smoking. His smoking use included Cigarettes. He has a 25.00 pack-year smoking  history. He has never used smokeless tobacco. He reports that he does not drink alcohol or use drugs.   No family history on file.   Allergies  Allergen Reactions  . Penicillins     Has patient had a PCN reaction causing immediate rash, facial/tongue/throat swelling, SOB or lightheadedness with hypotension: Yes Has patient had a PCN reaction causing severe rash involving mucus membranes or skin necrosis: No Has patient had a PCN reaction that required hospitalization: No Has patient had a PCN reaction occurring within the last 10 years: No If all of the above answers are "NO", then may proceed with Cephalosporin use.   . Darvon Swelling  . Celexa [Citalopram Hydrobromide] Nausea And Vomiting  . Cortisone Swelling  . Cymbalta [Duloxetine Hcl] Nausea Only  . Eggs Or Egg-Derived Products Nausea And Vomiting  . Guaifenesin Er   . Keflex [Cephalexin] Swelling  . Motrin [Ibuprofen]   . Pumpkin Seed Nausea And Vomiting    All pumpkin products  . Sweet Potato Nausea And Vomiting       . Tea Nausea And Vomiting  . Zoloft [Sertraline] Nausea Only     Prior to Admission medications   Medication Sig Start Date End Date Taking? Authorizing Provider  ALPRAZolam (XANAX) 0.25 MG tablet Take 0.25 mg by mouth 5 (five) times daily as needed for anxiety.    Yes [provider]  apixaban (ELIQUIS) 2.5 MG TABS tablet Take 1 tablet (2.5 mg total) by mouth 2 (two) times daily. 07/06/16  Yes Sinda Du, MD  CRANBERRY PO Take 1 tablet by mouth daily.   Yes [provider]  diltiazem (CARDIZEM LA) 240 MG 24 hr tablet Take 1 tablet (240 mg total) by mouth daily. 07/06/16  Yes Sinda Du, MD  Docusate Calcium (STOOL SOFTENER PO) Take 1 tablet by mouth 2 (two) times daily.   Yes [provider]  escitalopram (LEXAPRO) 10 MG tablet Take 1 tablet by mouth daily. 12/02/13  Yes [provider]  HYDROcodone-acetaminophen (NORCO) 10-325 MG tablet Take 1-2 tablets by  mouth every 4 (four) hours as needed for pain. 10/15/16  Yes [provider]  lidocaine (XYLOCAINE) 2 % jelly Apply 1 application topically once. 10/01/16  Yes [provider]  Meth-Hyo-M Bl-Na Phos-Ph Sal (URIBEL) 118 MG CAPS Take 1 capsule by mouth every 6 (six) hours.   Yes [provider]  polyethylene glycol (MIRALAX / GLYCOLAX) packet Take 17 g by mouth daily as needed.   Yes [provider]  SPIRIVA HANDIHALER 18 MCG inhalation capsule Place 1 capsule into inhaler and inhale daily. 12/11/11  Yes [provider]  torsemide (DEMADEX) 100 MG tablet Take 0.5 tablets by mouth daily. 11/22/13  Yes [provider]  Tripelennamine HCl POWD Take 25 mg by mouth 2 (two) times daily.    Yes [provider]  vancomycin 500 mg in sodium chloride 0.9 % 100 mL Inject 500 mg into the vein daily. 12/30/16  Yes Sinda Du, MD    Review of Systems:  Limited secondary to the patient's mental status   Physical Exam: Vitals:   01/01/17 0728 01/01/17 0730 01/01/17 0800 01/01/17 1014  BP:  (!) 116/52  (!) 123/48  Pulse:  (!) 120 (!) 109 (!) 119  Resp:  16  (!) 23  Temp:  97.8 F (36.6 C)    TempSrc:  Oral    SpO2:  94% 98% 97%  Weight: 57.2 kg (126 lb)     Height: 5\' 5"  (1.651 m)      General:  A&O x 2, NAD, nontoxic, pleasant/cooperative Head/Eye: No conjunctival hemorrhage, no icterus, [place]/AT, No nystagmus ENT:  No icterus,  No thrush, good dentition, no pharyngeal exudate Neck:  No masses, no lymphadenpathy, no bruits CV:  IRRR, no rub, no gallop, no S3 Lung:  Bibasilar rales. No wheezing.  Abdomen: soft/NT, +BS, nondistended, no peritoneal signs Ext: No cyanosis, No rashes, No petechiae, No lymphangitis, No edema  Labs on Admission:  Basic Metabolic Panel:  Recent Labs Lab 12/28/16 1134 12/29/16 0606 01/01/17 0849  NA 136 135 135  K 4.9 4.4 4.0  CL 106 105 101  CO2 24 25 20*  GLUCOSE 108* 105* 136*  BUN 47* 39* 55*    CREATININE 1.70* 1.41* 2.37*  CALCIUM 8.5* 8.2* 8.6*   Liver Function Tests:  Recent Labs Lab 01/01/17 0849  AST 32  ALT 14*  ALKPHOS 56  BILITOT 0.9  PROT 6.9  ALBUMIN 3.4*   No results for input(s): LIPASE, AMYLASE in the last 168 hours. No results for input(s): AMMONIA in the last 168 hours. CBC:  Recent Labs Lab 12/28/16 1134 12/29/16 0606 01/01/17 0849  WBC 16.5* 15.4* 32.2*  NEUTROABS 14.0*  --  29.7*  HGB 7.2* 7.6* 8.0*  HCT 21.5* 22.9* 26.4*  MCV 99.1 99.6 108.6*  PLT 256 274 330   Coagulation Profile:  Recent Labs Lab 12/28/16 1134  INR 1.58   Cardiac Enzymes: No results for input(s): CKTOTAL, CKMB, CKMBINDEX, TROPONINI in the last 168 hours. BNP: Invalid input(s): POCBNP CBG: No results for input(s): GLUCAP in the last 168 hours. Urine analysis:    Component Value Date/Time   COLORURINE BROWN (A) 12/28/2016 1124   APPEARANCEUR TURBID (A) 12/28/2016 1124   LABSPEC 1.015 12/28/2016 1124   PHURINE 7.0 12/28/2016 1124   GLUCOSEU (A) 12/28/2016 1124    TEST NOT REPORTED DUE TO COLOR INTERFERENCE OF URINE PIGMENT   HGBUR (A) 12/28/2016 1124    TEST NOT REPORTED DUE TO COLOR INTERFERENCE OF URINE PIGMENT   BILIRUBINUR (A) 12/28/2016 1124    TEST NOT REPORTED DUE TO COLOR INTERFERENCE OF URINE PIGMENT   KETONESUR (A) 12/28/2016 1124    TEST NOT REPORTED DUE TO COLOR INTERFERENCE OF URINE PIGMENT   PROTEINUR (A) 12/28/2016 1124    TEST NOT REPORTED DUE TO COLOR INTERFERENCE OF URINE PIGMENT   UROBILINOGEN 2.0 (H) 02/03/2014 1442   NITRITE (A) 12/28/2016 1124    TEST NOT REPORTED DUE TO COLOR INTERFERENCE OF URINE PIGMENT   LEUKOCYTESUR (A) 12/28/2016 1124    TEST NOT REPORTED DUE TO COLOR INTERFERENCE OF URINE PIGMENT   Sepsis Labs: @LABRCNTIP (procalcitonin:4,lacticidven:4) ) Recent Results (from the past 240 hour(s))  Culture, Urine     Status: Abnormal   Collection Time: 12/24/16  1:30 PM  Result Value Ref Range Status   Specimen  Description URINE, CATHETERIZED  Final   Special Requests NONE  Final   Culture ENTEROCOCCUS FAECALIS (A)  Final   Report Status 12/27/2016 FINAL  Final   Organism ID, Bacteria ENTEROCOCCUS FAECALIS  Final      Susceptibility   Enterococcus faecalis - MIC*    AMPICILLIN <=2 SENSITIVE Sensitive     LEVOFLOXACIN >=8 RESISTANT Resistant     NITROFURANTOIN 128 RESISTANT Resistant     VANCOMYCIN 1 SENSITIVE Sensitive     * ENTEROCOCCUS FAECALIS  Urine culture  Status: Abnormal   Collection Time: 12/28/16 11:24 AM  Result Value Ref Range Status   Specimen Description URINE, CLEAN CATCH  Final   Special Requests Normal  Final   Culture >=100,000 COLONIES/mL ENTEROCOCCUS FAECALIS (A)  Final   Report Status 12/30/2016 FINAL  Final   Organism ID, Bacteria ENTEROCOCCUS FAECALIS (A)  Final      Susceptibility   Enterococcus faecalis - MIC*    AMPICILLIN <=2 SENSITIVE Sensitive     LEVOFLOXACIN >=8 RESISTANT Resistant     NITROFURANTOIN 128 RESISTANT Resistant     VANCOMYCIN 1 SENSITIVE Sensitive     * >=100,000 COLONIES/mL ENTEROCOCCUS FAECALIS  Culture, blood (Routine X 2) w Reflex to ID Panel     Status: None (Preliminary result)   Collection Time: 01/01/17  8:49 AM  Result Value Ref Range Status   Specimen Description BLOOD LEFT ARM  Final   Special Requests   Final    BOTTLES DRAWN AEROBIC AND ANAEROBIC Blood Culture results may not be optimal due to an excessive volume of blood received in culture bottles   Culture PENDING  Incomplete   Report Status PENDING  Incomplete  Culture, blood (Routine X 2) w Reflex to ID Panel     Status: None (Preliminary result)   Collection Time: 01/01/17  8:57 AM  Result Value Ref Range Status   Specimen Description BLOOD LEFT ARM  Final   Special Requests   Final    BOTTLES DRAWN AEROBIC AND ANAEROBIC Blood Culture adequate volume   Culture PENDING  Incomplete   Report Status PENDING  Incomplete     Radiological Exams on Admission: Ct Head Wo  Contrast  Result Date: 01/01/2017 CLINICAL DATA:  Altered mental status. EXAM: CT HEAD WITHOUT CONTRAST TECHNIQUE: Contiguous axial images were obtained from the base of the skull through the vertex without intravenous contrast. COMPARISON:  06/27/2016 FINDINGS: Brain: There is no evidence for acute hemorrhage, hydrocephalus, mass lesion, or abnormal extra-axial fluid collection. No definite CT evidence for acute infarction. Diffuse loss of parenchymal volume is consistent with atrophy. Patchy low attenuation in the deep hemispheric and periventricular white matter is nonspecific, but likely reflects chronic microvascular ischemic demyelination. Vascular: No hyperdense vessel or unexpected calcification. Skull: No evidence for fracture. No worrisome lytic or sclerotic lesion. Sinuses/Orbits: The visualized paranasal sinuses and mastoid air cells are clear. Visualized portions of the globes and intraorbital fat are unremarkable. Other: None. IMPRESSION: 1. Stable.  No acute intracranial abnormality. 2. Atrophy with chronic small vessel white matter ischemic disease. Electronically Signed   By: Misty Stanley M.D.   On: 01/01/2017 09:50   Dg Chest Port 1 View  Result Date: 01/01/2017 CLINICAL DATA:  PICC line placed on Sat/copd/hx pneumonia/htn/ex smoker EXAM: PORTABLE CHEST 1 VIEW COMPARISON:  12/28/2016. FINDINGS: PICC line terminates at the mid to low SVC. Midline trachea. Normal heart size. Atherosclerosis in the transverse aorta. No pleural effusion or pneumothorax. The Chin overlies the apices minimally. Diffuse peribronchial thickening. Mild hyperinflation. Prominent gas-filled bowel loops in the upper abdomen are incompletely imaged. IMPRESSION: No acute cardiopulmonary disease. Peribronchial thickening which may relate to chronic bronchitis or smoking. Electronically Signed   By: Abigail Miyamoto M.D.   On: 01/01/2017 09:06    EKG: Independently reviewed. Sinus tachycardia, IRBBB    Time spent:65  minutes Code Status:   FULL Family Communication:  Spouse updated at bedside 8/8 Disposition Plan: expect 2-3 day hospitalization Consults called: none DVT Prophylaxis: SCDs  Ajamu Maxon, DO  [facility] Pager [phone]  If 7PM-7AM, please contact night-coverage [email or web address] Password [email or web address] 01/01/2017, 12:10 PM

## 2017-01-01 NOTE — ED Provider Notes (Signed)
[facility] DEPT Provider Note   CSN: [id number] Arrival date & time: 01/01/17  1829     History   Chief Complaint Chief Complaint  Patient presents with  . Vascular Access Problem    HPI EULA MAZZOLA is a 76 y.o. male.  HPI Patient was recently hospitalized for multidrug resistant enterococcus UTI. Had PICC line placed at that time. Start on IV vancomycin. Discharged 2 days ago. Family member at bedside has had multiple falls and increased confusion of the last 2 days. No known head injury. Patient denies any headache or neck pain. He does complain of suprapubic and groin pain. Full catheter is in place and has had decreased urine production. No known fevers or chills. Past Medical History:  Diagnosis Date  . Anxiety   . Charcot Marie Tooth muscular atrophy   . COPD (chronic obstructive pulmonary disease) ([place])   . Hemorrhoids   . History of pneumonia   . Hypertension   . Nerve disorder 1996   "Charot Marie"  . Neuropathy   . Tubular adenoma     Patient Active Problem List   Diagnosis Date Noted  . Sepsis due to undetermined organism ([place]) 01/01/2017  . Acute metabolic encephalopathy [id number]/6967  . Atrial fibrillation with RVR ([place]) 01/01/2017  . AKI (acute kidney injury) ([place])   . UTI (urinary tract infection) due to Enterococcus 12/28/2016  . Atrial fibrillation, chronic ([place]) 12/28/2016  . Pressure injury of skin 07/02/2016  . Atrial fibrillation with rapid ventricular response ([place]) 06/29/2016  . Congestive heart failure ([place]) 06/29/2016  . Cellulitis of right leg 06/24/2016  . Sepsis ([place]) 06/24/2016  . HTN (hypertension) 06/24/2016  . Neurogenic bladder 06/24/2016  . Chronic indwelling Foley catheter 06/24/2016    Past Surgical History:  Procedure Laterality Date  . CATARACT EXTRACTION, BILATERAL    . COLONOSCOPY  04/16/2010   Dr.Rourk- multiple cecal polyps, remainder of the colonic mucosa and rectal mucosa appeared normal. internal hemorrhoids bx=  tubular adenoma  . MASTECTOMY FOR GYNECOMASTIA  1979 and 1981  . POSTERIOR CERVICAL FUSION/FORAMINOTOMY  02/06/2012   Procedure: POSTERIOR CERVICAL FUSION/FORAMINOTOMY LEVEL 2;  Surgeon: Ophelia Charter, MD;  Location: [facility];  Service: Neurosurgery;  Laterality: N/A;  Posterior Cervical Three-Four/Four-Five Laminectomy and Fusion with Instrumentation  . TONSILLECTOMY         Home Medications    Prior to Admission medications   Medication Sig Start Date End Date Taking? Authorizing Provider  ALPRAZolam (XANAX) 0.25 MG tablet Take 0.25 mg by mouth 5 (five) times daily as needed for anxiety.    Yes [provider]  apixaban (ELIQUIS) 2.5 MG TABS tablet Take 1 tablet (2.5 mg total) by mouth 2 (two) times daily. 07/06/16  Yes Sinda Du, MD  CRANBERRY PO Take 1 tablet by mouth daily.   Yes [provider]  diltiazem (CARDIZEM LA) 240 MG 24 hr tablet Take 1 tablet (240 mg total) by mouth daily. 07/06/16  Yes Sinda Du, MD  Docusate Calcium (STOOL SOFTENER PO) Take 1 tablet by mouth 2 (two) times daily.   Yes [provider]  escitalopram (LEXAPRO) 10 MG tablet Take 1 tablet by mouth daily. 12/02/13  Yes [provider]  HYDROcodone-acetaminophen (NORCO) 10-325 MG tablet Take 1-2 tablets by mouth every 4 (four) hours as needed for pain. 10/15/16  Yes [provider]  lidocaine (XYLOCAINE) 2 % jelly Apply 1 application topically once. 10/01/16  Yes [provider]  Meth-Hyo-M Bl-Na Phos-Ph Sal (URIBEL) 118 MG  CAPS Take 1 capsule by mouth every 6 (six) hours.   Yes [provider]  polyethylene glycol (MIRALAX / GLYCOLAX) packet Take 17 g by mouth daily as needed.   Yes [provider]  SPIRIVA HANDIHALER 18 MCG inhalation capsule Place 1 capsule into inhaler and inhale daily. 12/11/11  Yes [provider]  torsemide (DEMADEX) 100 MG tablet Take 0.5 tablets by mouth daily. 11/22/13  Yes [provider]   Tripelennamine HCl POWD Take 25 mg by mouth 2 (two) times daily.    Yes [provider]  vancomycin 500 mg in sodium chloride 0.9 % 100 mL Inject 500 mg into the vein daily. 12/30/16  Yes Sinda Du, MD    Family History No family history on file.  Social History Social History  Substance Use Topics  . Smoking status: Former Smoker    Packs/day: 0.50    Years: 50.00    Types: Cigarettes  . Smokeless tobacco: Never Used     Comment: uses e cigs  . Alcohol use No     Allergies   Penicillins; Darvon; Celexa [citalopram hydrobromide]; Cortisone; Cymbalta [duloxetine hcl]; Eggs or egg-derived products; Guaifenesin er; Keflex [cephalexin]; Motrin [ibuprofen]; Pumpkin seed; Sweet potato; Tea; and Zoloft [sertraline]   Review of Systems Review of Systems  Constitutional: Positive for fatigue. Negative for chills and fever.  HENT: Negative for congestion, sinus pressure and sore throat.   Eyes: Negative for visual disturbance.  Respiratory: Negative for cough, chest tightness and shortness of breath.   Gastrointestinal: Positive for abdominal distention and abdominal pain. Negative for diarrhea, nausea and vomiting.  Genitourinary: Positive for dysuria, hematuria and penile pain. Negative for flank pain and frequency.  Musculoskeletal: Negative for back pain and neck pain.  Skin: Negative for rash and wound.  Neurological: Positive for weakness. Negative for dizziness, light-headedness, numbness and headaches.  Psychiatric/Behavioral: Positive for confusion.  All other systems reviewed and are negative.    Physical Exam Updated Vital Signs BP (!) 100/55   Pulse 68   Temp 98.3 F (36.8 C) (Oral)   Resp 11   Ht 5\' 5"  (1.651 m)   Wt 53 kg (116 lb 13.5 oz)   SpO2 99%   BMI 19.44 kg/m   Physical Exam  Constitutional: He is oriented to person, place, and time. He appears well-developed and well-nourished. No distress.  Frail-appearing  HENT:  Head:  Normocephalic and atraumatic.  Mouth/Throat: Oropharynx is clear and moist. No oropharyngeal exudate.  No evidence of head injury.  Eyes: Pupils are equal, round, and reactive to light. EOM are normal.  Neck: Normal range of motion. Neck supple.  No posterior midline cervical tenderness to palpation.  Cardiovascular: Normal rate and regular rhythm.   Pulmonary/Chest: Effort normal and breath sounds normal. No respiratory distress. He has no wheezes. He has no rales. He exhibits no tenderness.  Abdominal: Soft. Bowel sounds are normal. He exhibits distension. There is tenderness. There is no rebound and no guarding.  Patient has suprapubic fullness and tenderness to palpation. No rebound or guarding.  Genitourinary: Penis normal.  Genitourinary Comments: Foley catheter in place. There is urine leaking around the catheter. No hematuria or obvious trauma.  Musculoskeletal: Normal range of motion. He exhibits no edema or tenderness.  Extremity atrophy. Distal pulses intact.  Neurological: He is alert and oriented to person, place, and time.  Skin: Skin is warm and dry. No rash noted. No erythema.  Psychiatric: He has a normal mood and affect. His behavior  is normal.  Nursing note and vitals reviewed.    ED Treatments / Results  Labs (all labs ordered are listed, but only abnormal results are displayed) Labs Reviewed  CBC WITH DIFFERENTIAL/PLATELET - Abnormal; Notable for the following:       Result Value   WBC 32.2 (*)    RBC 2.43 (*)    Hemoglobin 8.0 (*)    HCT 26.4 (*)    MCV 108.6 (*)    Neutro Abs 29.7 (*)    Lymphs Abs 0.6 (*)    Monocytes Absolute 1.9 (*)    All other components within normal limits  COMPREHENSIVE METABOLIC PANEL - Abnormal; Notable for the following:    CO2 20 (*)    Glucose, Bld 136 (*)    BUN 55 (*)    Creatinine, Ser 2.37 (*)    Calcium 8.6 (*)    Albumin 3.4 (*)    ALT 14 (*)    GFR calc non Af Amer 25 (*)    GFR calc Af Amer 29 (*)    All other  components within normal limits  URINALYSIS, ROUTINE W REFLEX MICROSCOPIC - Abnormal; Notable for the following:    Color, Urine RED (*)    APPearance HAZY (*)    Glucose, UA 100 (*)    Hgb urine dipstick LARGE (*)    Bilirubin Urine LARGE (*)    Ketones, ur 15 (*)    Protein, ur >300 (*)    Nitrite POSITIVE (*)    Leukocytes, UA MODERATE (*)    All other components within normal limits  PROTIME-INR - Abnormal; Notable for the following:    Prothrombin Time 22.2 (*)    All other components within normal limits  APTT - Abnormal; Notable for the following:    aPTT 45 (*)    All other components within normal limits  URINALYSIS, MICROSCOPIC (REFLEX) - Abnormal; Notable for the following:    Bacteria, UA MANY (*)    Squamous Epithelial / LPF 0-5 (*)    All other components within normal limits  BASIC METABOLIC PANEL - Abnormal; Notable for the following:    Chloride 112 (*)    CO2 20 (*)    Glucose, Bld 141 (*)    BUN 41 (*)    Creatinine, Ser 1.44 (*)    Calcium 7.6 (*)    GFR calc non Af Amer 45 (*)    GFR calc Af Amer 53 (*)    All other components within normal limits  CBC - Abnormal; Notable for the following:    WBC 24.3 (*)    RBC 1.84 (*)    Hemoglobin 6.0 (*)    HCT 18.2 (*)    All other components within normal limits  CBC WITH DIFFERENTIAL/PLATELET - Abnormal; Notable for the following:    WBC 18.2 (*)    RBC 3.29 (*)    Hemoglobin 10.2 (*)    HCT 30.6 (*)    RDW 18.5 (*)    Neutro Abs 15.0 (*)    Monocytes Absolute 1.2 (*)    All other components within normal limits  BASIC METABOLIC PANEL - Abnormal; Notable for the following:    Potassium 3.1 (*)    Chloride 113 (*)    CO2 20 (*)    Glucose, Bld 107 (*)    BUN 30 (*)    Calcium 7.7 (*)    All other components within normal limits  HEMOGLOBIN AND HEMATOCRIT, BLOOD - Abnormal; Notable for  the following:    Hemoglobin 9.6 (*)    HCT 27.9 (*)    All other components within normal limits  I-STAT CG4  LACTIC ACID, ED - Abnormal; Notable for the following:    Lactic Acid, Venous 1.97 (*)    All other components within normal limits  CULTURE, BLOOD (ROUTINE X 2)  CULTURE, BLOOD (ROUTINE X 2)  URINE CULTURE  MRSA PCR SCREENING  VANCOMYCIN, RANDOM  PROCALCITONIN  PROCALCITONIN  PROCALCITONIN  OCCULT BLOOD X 1 CARD TO LAB, STOOL  I-STAT CG4 LACTIC ACID, ED  TYPE AND SCREEN  PREPARE RBC (CROSSMATCH)  ABO/RH    EKG  EKG Interpretation  Date/Time:  Wednesday January 01 2017 09:54:15 EDT Ventricular Rate:  112 PR Interval:    QRS Duration: 116 QT Interval:  336 QTC Calculation: 459 R Axis:   -73 Text Interpretation:  Sinus tachycardia Multiform ventricular premature complexes Left anterior fascicular block Anterior infarct, old Nonspecific T abnormalities, lateral leads agree. no STEMI. Inferior and lateral QRS appear similar to previous except rate related changes. Confirmed by Charlesetta Shanks [phone]) on 01/02/2017 1:46:58 PM       Radiology No results found.  Procedures Procedures (including critical care time)  Medications Ordered in ED Medications  HYDROcodone-acetaminophen (NORCO) 10-325 MG per tablet 1-2 tablet (1 tablet Oral Given 01/03/17 0915)  URIBEL 118 MG CAPS 118 mg (118 mg Oral Not Given 01/03/17 1530)  docusate sodium (COLACE) capsule 100 mg (100 mg Oral Not Given 01/03/17 0914)  tiotropium (SPIRIVA) inhalation capsule 18 mcg (18 mcg Inhalation Given 01/03/17 0857)  ALPRAZolam (XANAX) tablet 0.25 mg (0.25 mg Oral Given 01/03/17 0339)  0.9 %  sodium chloride infusion ( Intravenous New Bag/Given 01/01/17 1336)  acetaminophen (TYLENOL) tablet 650 mg (not administered)    Or  acetaminophen (TYLENOL) suppository 650 mg (not administered)  ondansetron (ZOFRAN) tablet 4 mg (not administered)    Or  ondansetron (ZOFRAN) injection 4 mg (not administered)  diltiazem (CARDIZEM) 100 mg in dextrose 5 % 100 mL (1 mg/mL) infusion (5 mg/hr Intravenous Rate/Dose Change 01/03/17  0605)  linezolid (ZYVOX) tablet 600 mg (600 mg Oral Given 01/03/17 0921)  aztreonam (AZACTAM) 1 g in dextrose 5 % 50 mL IVPB (0 g Intravenous Stopped 01/03/17 1529)  pantoprazole (PROTONIX) injection 40 mg (40 mg Intravenous Given 01/03/17 0915)  potassium chloride 20 MEQ/15ML (10%) solution 20 mEq (10 mEq Oral Given 01/03/17 0914)  sodium chloride 0.9 % bolus 500 mL (0 mLs Intravenous Stopped 01/01/17 1242)  sodium chloride 0.9 % bolus 500 mL (0 mLs Intravenous Stopped 01/01/17 1300)  aztreonam (AZACTAM) 1 g in dextrose 5 % 50 mL IVPB (0 g Intravenous Stopped 01/01/17 1406)  linezolid (ZYVOX) IVPB 600 mg (0 mg Intravenous Stopped 01/01/17 1512)  0.9 %  sodium chloride infusion ( Intravenous New Bag/Given 01/02/17 1020)  acetaminophen (TYLENOL) tablet 650 mg (650 mg Oral Given 01/02/17 1038)  diphenhydrAMINE (BENADRYL) capsule 25 mg (25 mg Oral Given 01/02/17 1038)  furosemide (LASIX) injection 20 mg (20 mg Intravenous Given 01/02/17 0844)     Initial Impression / Assessment and Plan / ED Course  I have reviewed the triage vital signs and the nursing notes.  Pertinent labs & imaging results that were available during my care of the patient were reviewed by me and considered in my medical decision making (see chart for details).     CT head without acute findings. Patient has significantly elevated white blood cell count is over his baseline. He also has  increase in BUN and creatinine. Have given multiple boluses of IV fluids and started on broad-spectrum IV antibiotics. Discussed with hospitalist who will see patient in emergency department and admit.  Final Clinical Impressions(s) / ED Diagnoses   Final diagnoses:  AKI (acute kidney injury) [facility])  Confusion    New Prescriptions Current Discharge Medication List       Julianne Rice, MD 01/03/17 1549

## 2017-01-02 LAB — CBC
HEMATOCRIT: 18.2 % — AB (ref 39.0–52.0)
HEMOGLOBIN: 6 g/dL — AB (ref 13.0–17.0)
MCH: 32.6 pg (ref 26.0–34.0)
MCHC: 33 g/dL (ref 30.0–36.0)
MCV: 98.9 fL (ref 78.0–100.0)
Platelets: 265 10*3/uL (ref 150–400)
RBC: 1.84 MIL/uL — ABNORMAL LOW (ref 4.22–5.81)
RDW: 14.9 % (ref 11.5–15.5)
WBC: 24.3 10*3/uL — AB (ref 4.0–10.5)

## 2017-01-02 LAB — BASIC METABOLIC PANEL
ANION GAP: 7 (ref 5–15)
BUN: 41 mg/dL — ABNORMAL HIGH (ref 6–20)
CALCIUM: 7.6 mg/dL — AB (ref 8.9–10.3)
CO2: 20 mmol/L — ABNORMAL LOW (ref 22–32)
Chloride: 112 mmol/L — ABNORMAL HIGH (ref 101–111)
Creatinine, Ser: 1.44 mg/dL — ABNORMAL HIGH (ref 0.61–1.24)
GFR, EST AFRICAN AMERICAN: 53 mL/min — AB (ref >60)
GFR, EST NON AFRICAN AMERICAN: 45 mL/min — AB (ref >60)
Glucose, Bld: 141 mg/dL — ABNORMAL HIGH (ref 65–99)
POTASSIUM: 3.8 mmol/L (ref 3.5–5.1)
SODIUM: 139 mmol/L (ref 135–145)

## 2017-01-02 LAB — PROCALCITONIN: PROCALCITONIN: 0.15 ng/mL

## 2017-01-02 LAB — HEMOGLOBIN AND HEMATOCRIT, BLOOD
HCT: 27.9 % — ABNORMAL LOW (ref 39.0–52.0)
HEMOGLOBIN: 9.6 g/dL — AB (ref 13.0–17.0)

## 2017-01-02 LAB — PREPARE RBC (CROSSMATCH)

## 2017-01-02 LAB — ABO/RH: ABO/RH(D): B POS

## 2017-01-02 MED ORDER — FUROSEMIDE 10 MG/ML IJ SOLN
20.0000 mg | Freq: Once | INTRAMUSCULAR | Status: AC
  Administered 2017-01-02: 20 mg via INTRAVENOUS
  Filled 2017-01-02: qty 2

## 2017-01-02 MED ORDER — SODIUM CHLORIDE 0.9 % IV SOLN
Freq: Once | INTRAVENOUS | Status: AC
  Administered 2017-01-02: 10:00:00 via INTRAVENOUS

## 2017-01-02 MED ORDER — PANTOPRAZOLE SODIUM 40 MG IV SOLR
40.0000 mg | INTRAVENOUS | Status: DC
  Administered 2017-01-02 – 2017-01-04 (×3): 40 mg via INTRAVENOUS
  Filled 2017-01-02 (×3): qty 40

## 2017-01-02 MED ORDER — ACETAMINOPHEN 325 MG PO TABS
650.0000 mg | ORAL_TABLET | Freq: Once | ORAL | Status: AC
Start: 2017-01-02 — End: 2017-01-02
  Administered 2017-01-02: 650 mg via ORAL
  Filled 2017-01-02: qty 2

## 2017-01-02 MED ORDER — DIPHENHYDRAMINE HCL 25 MG PO CAPS
25.0000 mg | ORAL_CAPSULE | Freq: Once | ORAL | Status: AC
  Administered 2017-01-02: 25 mg via ORAL
  Filled 2017-01-02: qty 1

## 2017-01-02 NOTE — Progress Notes (Signed)
CRITICAL VALUE ALERT  Critical Value:  Hbg 6.0  Date & Time Notied:  01/02/17 0714  Provider Notified: Dr.Hawkins  Orders Received/Actions taken: New order for blood transfusion received

## 2017-01-02 NOTE — Care Management Note (Addendum)
Case Management Note  Patient Details  Name: Erik Atkins MRN: [id number] Date of Birth: 1941/05/12  Subjective/Objective:  Chart reviewed. No CM consult. Adm with sepsis/UTI. From home with wife. Active with [facility] home [facility] and was recently discharged home with IV antibiotics.  PRBC transfusion today, check stools for blood.           Action/Plan: CM following for needs. Will place home health resumption order.   ADDENDUM: Family feels patient will short term rehab. PT eval placed. CSW aware.   Expected Discharge Date:  01/06/17               Expected Discharge Plan:     In-House Referral:     Discharge planning Services  CM Consult  Post Acute Care Choice:    Choice offered to:     DME Arranged:    DME Agency:     HH Arranged:    HH Agency:     Status of Service:  In process, will continue to follow  If discussed at Long Length of Stay Meetings, dates discussed:    Additional Comments:  Erik Atkins, [name], RN 01/02/2017, 1:59 PM

## 2017-01-02 NOTE — Progress Notes (Signed)
Subjective: He was admitted yesterday with sepsis. He has a urinary tract infection and has become septic from that despite receiving IV antibiotics. He's having hematuria. His hemoglobin level has dropped he looks much worse than usual. At baseline he has atrial fib congestive heart failure COPD muscular dystrophy chronic neurogenic bladder. He has been on anticoagulation but that has been held  Objective: Vital signs in last 24 hours: Temp:  [97.3 F (36.3 C)-98.5 F (36.9 C)] 98 F (36.7 C) (08/09 0804) Pulse Rate:  [88-128] 102 (08/09 0804) Resp:  [15-26] 18 (08/09 0804) BP: (83-144)/(45-111) 96/52 (08/09 0700) SpO2:  [85 %-100 %] 94 % (08/09 0804) Weight:  [49 kg (108 lb 0.4 oz)-51.8 kg (114 lb 3.2 oz)] 51.8 kg (114 lb 3.2 oz) (08/09 0457) Weight change:  Last BM Date: 12/31/16  Intake/Output from previous day: 08/08 0701 - 08/09 0700 In: 349.6 [I.V.:349.6] Out: 1100 [Urine:1100]  PHYSICAL EXAM General appearance: alert, cooperative, mild distress and Very pale Resp: rhonchi bilaterally Cardio: irregularly irregular rhythm GI: soft, non-tender; bowel sounds normal; no masses,  no organomegaly Extremities: He has chronic skin lesions on his legs Mucous membranes are somewhat dry  Lab Results:  Results for orders placed or performed during the hospital encounter of 01/01/17 (from the past 48 hour(s))  CBC with Differential/Platelet     Status: Abnormal   Collection Time: 01/01/17  8:49 AM  Result Value Ref Range   WBC 32.2 (H) 4.0 - 10.5 K/uL   RBC 2.43 (L) 4.22 - 5.81 MIL/uL   Hemoglobin 8.0 (L) 13.0 - 17.0 g/dL   HCT 26.4 (L) 39.0 - 52.0 %   MCV 108.6 (H) 78.0 - 100.0 fL   MCH 32.9 26.0 - 34.0 pg   MCHC 30.3 30.0 - 36.0 g/dL   RDW 15.1 11.5 - 15.5 %   Platelets 330 150 - 400 K/uL   Neutrophils Relative % 92 %   Lymphocytes Relative 2 %   Monocytes Relative 6 %   Eosinophils Relative 0 %   Basophils Relative 0 %   Neutro Abs 29.7 (H) 1.7 - 7.7 K/uL   Lymphs Abs  0.6 (L) 0.7 - 4.0 K/uL   Monocytes Absolute 1.9 (H) 0.1 - 1.0 K/uL   Eosinophils Absolute 0.0 0.0 - 0.7 K/uL   Basophils Absolute 0.0 0.0 - 0.1 K/uL   WBC Morphology WHITE COUNT CONFIRMED ON SMEAR   Comprehensive metabolic panel     Status: Abnormal   Collection Time: 01/01/17  8:49 AM  Result Value Ref Range   Sodium 135 135 - 145 mmol/L   Potassium 4.0 3.5 - 5.1 mmol/L   Chloride 101 101 - 111 mmol/L   CO2 20 (L) 22 - 32 mmol/L   Glucose, Bld 136 (H) 65 - 99 mg/dL   BUN 55 (H) 6 - 20 mg/dL   Creatinine, Ser 2.37 (H) 0.61 - 1.24 mg/dL   Calcium 8.6 (L) 8.9 - 10.3 mg/dL   Total Protein 6.9 6.5 - 8.1 g/dL   Albumin 3.4 (L) 3.5 - 5.0 g/dL   AST 32 15 - 41 U/L   ALT 14 (L) 17 - 63 U/L   Alkaline Phosphatase 56 38 - 126 U/L   Total Bilirubin 0.9 0.3 - 1.2 mg/dL   GFR calc non Af Amer 25 (L) >60 mL/min   GFR calc Af Amer 29 (L) >60 mL/min    Comment: (NOTE) The eGFR has been calculated using the CKD EPI equation. This calculation has not been validated  in all clinical situations. eGFR's persistently <60 mL/min signify possible Chronic Kidney Disease.    Anion gap 14 5 - 15  Culture, blood (Routine X 2) w Reflex to ID Panel     Status: None (Preliminary result)   Collection Time: 01/01/17  8:49 AM  Result Value Ref Range   Specimen Description BLOOD LEFT ARM    Special Requests      BOTTLES DRAWN AEROBIC AND ANAEROBIC Blood Culture results may not be optimal due to an excessive volume of blood received in culture bottles   Culture NO GROWTH < 24 HOURS    Report Status PENDING   Culture, blood (Routine X 2) w Reflex to ID Panel     Status: None (Preliminary result)   Collection Time: 01/01/17  8:57 AM  Result Value Ref Range   Specimen Description BLOOD LEFT ARM    Special Requests      BOTTLES DRAWN AEROBIC AND ANAEROBIC Blood Culture adequate volume   Culture NO GROWTH < 24 HOURS    Report Status PENDING   I-Stat CG4 Lactic Acid, ED     Status: None   Collection Time:  01/01/17 11:10 AM  Result Value Ref Range   Lactic Acid, Venous 1.66 0.5 - 1.9 mmol/L  I-Stat CG4 Lactic Acid, ED     Status: Abnormal   Collection Time: 01/01/17 11:51 AM  Result Value Ref Range   Lactic Acid, Venous 1.97 (H) 0.5 - 1.9 mmol/L  Protime-INR     Status: Abnormal   Collection Time: 01/01/17  1:32 PM  Result Value Ref Range   Prothrombin Time 22.2 (H) 11.4 - 15.2 seconds   INR 1.92   APTT     Status: Abnormal   Collection Time: 01/01/17  1:32 PM  Result Value Ref Range   aPTT 45 (H) 24 - 36 seconds    Comment:        IF BASELINE aPTT IS ELEVATED, SUGGEST PATIENT RISK ASSESSMENT BE USED TO DETERMINE APPROPRIATE ANTICOAGULANT THERAPY.   Procalcitonin - Baseline     Status: None   Collection Time: 01/01/17  1:32 PM  Result Value Ref Range   Procalcitonin 0.20 ng/mL    Comment:        Interpretation: PCT (Procalcitonin) <= 0.5 ng/mL: Systemic infection (sepsis) is not likely. Local bacterial infection is possible. (NOTE)         ICU PCT Algorithm               Non ICU PCT Algorithm    ----------------------------     ------------------------------         PCT < 0.25 ng/mL                 PCT < 0.1 ng/mL     Stopping of antibiotics            Stopping of antibiotics       strongly encouraged.               strongly encouraged.    ----------------------------     ------------------------------       PCT level decrease by               PCT < 0.25 ng/mL       >= 80% from peak PCT       OR PCT 0.25 - 0.5 ng/mL          Stopping of antibiotics  encouraged.     Stopping of antibiotics           encouraged.    ----------------------------     ------------------------------       PCT level decrease by              PCT >= 0.25 ng/mL       < 80% from peak PCT        AND PCT >= 0.5 ng/mL            Continuin g antibiotics                                              encouraged.       Continuing antibiotics             encouraged.    ----------------------------     ------------------------------     PCT level increase compared          PCT > 0.5 ng/mL         with peak PCT AND          PCT >= 0.5 ng/mL             Escalation of antibiotics                                          strongly encouraged.      Escalation of antibiotics        strongly encouraged.   Vancomycin, random     Status: None   Collection Time: 01/01/17  2:03 PM  Result Value Ref Range   Vancomycin Rm 16     Comment:        Random Vancomycin therapeutic range is dependent on dosage and time of specimen collection. A peak range is 20.0-40.0 ug/mL A trough range is 5.0-15.0 ug/mL          MRSA PCR Screening     Status: None   Collection Time: 01/01/17  2:35 PM  Result Value Ref Range   MRSA by PCR NEGATIVE NEGATIVE    Comment:        The GeneXpert MRSA Assay (FDA approved for NASAL specimens only), is one component of a comprehensive MRSA colonization surveillance program. It is not intended to diagnose MRSA infection nor to guide or monitor treatment for MRSA infections.   Urinalysis, Routine w reflex microscopic     Status: Abnormal   Collection Time: 01/01/17  2:50 PM  Result Value Ref Range   Color, Urine RED (A) YELLOW    Comment: BIOCHEMICALS MAY BE AFFECTED BY COLOR   APPearance HAZY (A) CLEAR   Specific Gravity, Urine 1.015 1.005 - 1.030   pH 7.0 5.0 - 8.0   Glucose, UA 100 (A) NEGATIVE mg/dL   Hgb urine dipstick LARGE (A) NEGATIVE   Bilirubin Urine LARGE (A) NEGATIVE   Ketones, ur 15 (A) NEGATIVE mg/dL   Protein, ur >300 (A) NEGATIVE mg/dL   Nitrite POSITIVE (A) NEGATIVE   Leukocytes, UA MODERATE (A) NEGATIVE  Urinalysis, Microscopic (reflex)     Status: Abnormal   Collection Time: 01/01/17  2:50 PM  Result Value Ref Range   RBC / HPF TOO NUMEROUS TO COUNT 0 - 5 RBC/hpf   WBC, UA 6-30 0 -  5 WBC/hpf   Bacteria, UA MANY (A) NONE SEEN   Squamous Epithelial / LPF 0-5 (A) NONE SEEN  Basic metabolic  panel     Status: Abnormal   Collection Time: 01/02/17  6:12 AM  Result Value Ref Range   Sodium 139 135 - 145 mmol/L   Potassium 3.8 3.5 - 5.1 mmol/L   Chloride 112 (H) 101 - 111 mmol/L   CO2 20 (L) 22 - 32 mmol/L   Glucose, Bld 141 (H) 65 - 99 mg/dL   BUN 41 (H) 6 - 20 mg/dL   Creatinine, Ser 1.44 (H) 0.61 - 1.24 mg/dL   Calcium 7.6 (L) 8.9 - 10.3 mg/dL   GFR calc non Af Amer 45 (L) >60 mL/min   GFR calc Af Amer 53 (L) >60 mL/min    Comment: (NOTE) The eGFR has been calculated using the CKD EPI equation. This calculation has not been validated in all clinical situations. eGFR's persistently <60 mL/min signify possible Chronic Kidney Disease.    Anion gap 7 5 - 15  CBC     Status: Abnormal   Collection Time: 01/02/17  6:12 AM  Result Value Ref Range   WBC 24.3 (H) 4.0 - 10.5 K/uL   RBC 1.84 (L) 4.22 - 5.81 MIL/uL   Hemoglobin 6.0 (LL) 13.0 - 17.0 g/dL    Comment: RESULT REPEATED AND VERIFIED CRITICAL RESULT CALLED TO, READ BACK BY AND VERIFIED WITH: ROBERTS,T AT 0710 BY HUFFINES,S ON 01/02/17.    HCT 18.2 (L) 39.0 - 52.0 %   MCV 98.9 78.0 - 100.0 fL    Comment: DELTA CHECK NOTED   MCH 32.6 26.0 - 34.0 pg   MCHC 33.0 30.0 - 36.0 g/dL   RDW 14.9 11.5 - 15.5 %   Platelets 265 150 - 400 K/uL  Procalcitonin     Status: None   Collection Time: 01/02/17  6:12 AM  Result Value Ref Range   Procalcitonin 0.15 ng/mL    Comment:        Interpretation: PCT (Procalcitonin) <= 0.5 ng/mL: Systemic infection (sepsis) is not likely. Local bacterial infection is possible. (NOTE)         ICU PCT Algorithm               Non ICU PCT Algorithm    ----------------------------     ------------------------------         PCT < 0.25 ng/mL                 PCT < 0.1 ng/mL     Stopping of antibiotics            Stopping of antibiotics       strongly encouraged.               strongly encouraged.    ----------------------------     ------------------------------       PCT level decrease by                PCT < 0.25 ng/mL       >= 80% from peak PCT       OR PCT 0.25 - 0.5 ng/mL          Stopping of antibiotics                                             encouraged.  Stopping of antibiotics           encouraged.    ----------------------------     ------------------------------       PCT level decrease by              PCT >= 0.25 ng/mL       < 80% from peak PCT        AND PCT >= 0.5 ng/mL            Continuin g antibiotics                                              encouraged.       Continuing antibiotics            encouraged.    ----------------------------     ------------------------------     PCT level increase compared          PCT > 0.5 ng/mL         with peak PCT AND          PCT >= 0.5 ng/mL             Escalation of antibiotics                                          strongly encouraged.      Escalation of antibiotics        strongly encouraged.     ABGS No results for input(s): PHART, PO2ART, TCO2, HCO3 in the last 72 hours.  Invalid input(s): PCO2 CULTURES Recent Results (from the past 240 hour(s))  Culture, Urine     Status: Abnormal   Collection Time: 12/24/16  1:30 PM  Result Value Ref Range Status   Specimen Description URINE, CATHETERIZED  Final   Special Requests NONE  Final   Culture ENTEROCOCCUS FAECALIS (A)  Final   Report Status 12/27/2016 FINAL  Final   Organism ID, Bacteria ENTEROCOCCUS FAECALIS  Final      Susceptibility   Enterococcus faecalis - MIC*    AMPICILLIN <=2 SENSITIVE Sensitive     LEVOFLOXACIN >=8 RESISTANT Resistant     NITROFURANTOIN 128 RESISTANT Resistant     VANCOMYCIN 1 SENSITIVE Sensitive     * ENTEROCOCCUS FAECALIS  Urine culture     Status: Abnormal   Collection Time: 12/28/16 11:24 AM  Result Value Ref Range Status   Specimen Description URINE, CLEAN CATCH  Final   Special Requests Normal  Final   Culture >=100,000 COLONIES/mL ENTEROCOCCUS FAECALIS (A)  Final   Report Status 12/30/2016 FINAL  Final    Organism ID, Bacteria ENTEROCOCCUS FAECALIS (A)  Final      Susceptibility   Enterococcus faecalis - MIC*    AMPICILLIN <=2 SENSITIVE Sensitive     LEVOFLOXACIN >=8 RESISTANT Resistant     NITROFURANTOIN 128 RESISTANT Resistant     VANCOMYCIN 1 SENSITIVE Sensitive     * >=100,000 COLONIES/mL ENTEROCOCCUS FAECALIS  Culture, blood (Routine X 2) w Reflex to ID Panel     Status: None (Preliminary result)   Collection Time: 01/01/17  8:49 AM  Result Value Ref Range Status   Specimen Description BLOOD LEFT ARM  Final   Special Requests   Final    BOTTLES DRAWN AEROBIC  AND ANAEROBIC Blood Culture results may not be optimal due to an excessive volume of blood received in culture bottles   Culture NO GROWTH < 24 HOURS  Final   Report Status PENDING  Incomplete  Culture, blood (Routine X 2) w Reflex to ID Panel     Status: None (Preliminary result)   Collection Time: 01/01/17  8:57 AM  Result Value Ref Range Status   Specimen Description BLOOD LEFT ARM  Final   Special Requests   Final    BOTTLES DRAWN AEROBIC AND ANAEROBIC Blood Culture adequate volume   Culture NO GROWTH < 24 HOURS  Final   Report Status PENDING  Incomplete  MRSA PCR Screening     Status: None   Collection Time: 01/01/17  2:35 PM  Result Value Ref Range Status   MRSA by PCR NEGATIVE NEGATIVE Final    Comment:        The GeneXpert MRSA Assay (FDA approved for NASAL specimens only), is one component of a comprehensive MRSA colonization surveillance program. It is not intended to diagnose MRSA infection nor to guide or monitor treatment for MRSA infections.    Studies/Results: Ct Head Wo Contrast  Result Date: 01/01/2017 CLINICAL DATA:  Altered mental status. EXAM: CT HEAD WITHOUT CONTRAST TECHNIQUE: Contiguous axial images were obtained from the base of the skull through the vertex without intravenous contrast. COMPARISON:  06/27/2016 FINDINGS: Brain: There is no evidence for acute hemorrhage, hydrocephalus, mass  lesion, or abnormal extra-axial fluid collection. No definite CT evidence for acute infarction. Diffuse loss of parenchymal volume is consistent with atrophy. Patchy low attenuation in the deep hemispheric and periventricular white matter is nonspecific, but likely reflects chronic microvascular ischemic demyelination. Vascular: No hyperdense vessel or unexpected calcification. Skull: No evidence for fracture. No worrisome lytic or sclerotic lesion. Sinuses/Orbits: The visualized paranasal sinuses and mastoid air cells are clear. Visualized portions of the globes and intraorbital fat are unremarkable. Other: None. IMPRESSION: 1. Stable.  No acute intracranial abnormality. 2. Atrophy with chronic small vessel white matter ischemic disease. Electronically Signed   By: Misty Stanley M.D.   On: 01/01/2017 09:50   Dg Chest Port 1 View  Result Date: 01/01/2017 CLINICAL DATA:  PICC line placed on Sat/copd/hx pneumonia/htn/ex smoker EXAM: PORTABLE CHEST 1 VIEW COMPARISON:  12/28/2016. FINDINGS: PICC line terminates at the mid to low SVC. Midline trachea. Normal heart size. Atherosclerosis in the transverse aorta. No pleural effusion or pneumothorax. The Chin overlies the apices minimally. Diffuse peribronchial thickening. Mild hyperinflation. Prominent gas-filled bowel loops in the upper abdomen are incompletely imaged. IMPRESSION: No acute cardiopulmonary disease. Peribronchial thickening which may relate to chronic bronchitis or smoking. Electronically Signed   By: Abigail Miyamoto M.D.   On: 01/01/2017 09:06    Medications:  Prior to Admission:  Prescriptions Prior to Admission  Medication Sig Dispense Refill Last Dose  . ALPRAZolam (XANAX) 0.25 MG tablet Take 0.25 mg by mouth 5 (five) times daily as needed for anxiety.    12/31/2016 at Unknown time  . apixaban (ELIQUIS) 2.5 MG TABS tablet Take 1 tablet (2.5 mg total) by mouth 2 (two) times daily. 60 tablet  12/31/2016 at 2000  . CRANBERRY PO Take 1 tablet by mouth  daily.   12/31/2016 at Unknown time  . diltiazem (CARDIZEM LA) 240 MG 24 hr tablet Take 1 tablet (240 mg total) by mouth daily.   12/31/2016 at Unknown time  . Docusate Calcium (STOOL SOFTENER PO) Take 1 tablet by mouth 2 (two)  times daily.   01/01/2017 at Unknown time  . escitalopram (LEXAPRO) 10 MG tablet Take 1 tablet by mouth daily.   12/31/2016 at Unknown time  . HYDROcodone-acetaminophen (NORCO) 10-325 MG tablet Take 1-2 tablets by mouth every 4 (four) hours as needed for pain.   01/01/2017 at Unknown time  . lidocaine (XYLOCAINE) 2 % jelly Apply 1 application topically once.   Past Month at Unknown time  . Meth-Hyo-M Bl-Na Phos-Ph Sal (URIBEL) 118 MG CAPS Take 1 capsule by mouth every 6 (six) hours.   12/31/2016 at Unknown time  . polyethylene glycol (MIRALAX / GLYCOLAX) packet Take 17 g by mouth daily as needed.   Past Week at Unknown time  . SPIRIVA HANDIHALER 18 MCG inhalation capsule Place 1 capsule into inhaler and inhale daily.   12/31/2016 at Unknown time  . torsemide (DEMADEX) 100 MG tablet Take 0.5 tablets by mouth daily.   12/31/2016 at Unknown time  . Tripelennamine HCl POWD Take 25 mg by mouth 2 (two) times daily.    12/31/2016 at Unknown time  . vancomycin 500 mg in sodium chloride 0.9 % 100 mL Inject 500 mg into the vein daily. 10 Dose 0 12/31/2016 at Unknown time   Scheduled: . acetaminophen  650 mg Oral Once  . diphenhydrAMINE  25 mg Oral Once  . docusate sodium  100 mg Oral BID  . furosemide  20 mg Intravenous Once  . linezolid  600 mg Oral Q12H  . pantoprazole (PROTONIX) IV  40 mg Intravenous Q24H  . tiotropium  1 capsule Inhalation Daily  . Tripelennamine HCl  25 mg Oral BID  . URIBEL  1 capsule Oral Q6H   Continuous: . sodium chloride 75 mL/hr at 01/01/17 1336  . sodium chloride    . aztreonam Stopped (01/02/17 0615)  . diltiazem (CARDIZEM) infusion 10 mg/hr (01/02/17 0825)   LNL:[id number] **OR** acetaminophen, ALPRAZolam, HYDROcodone-acetaminophen, ondansetron **OR**  ondansetron (ZOFRAN) IV  Assesment: He was admitted with sepsis presumably due to urinary tract infection. He has acute metabolic encephalopathy and that's better this morning. He had atrial fibrillation rapid ventricular response and he is on diltiazem IV for that. He is on antibiotics for presumed UTI. He has heart failure so we have to be careful about fluid resuscitation but he still needs fluids now. He has been anticoagulated but that's been held. He is anemic. He has been having blood in his urine but this may be from GI bleeding. He has a history of polyps in his colon but did not have follow-up colonoscopy because of his overall situation. He is high risk for significant deterioration. Discussed at length with his wife this morning. Active Problems:   Sepsis ([place])   Sepsis due to undetermined organism ([place])   Acute metabolic encephalopathy   Atrial fibrillation with RVR ([place])    Plan: Blood transfusion today. Continue to hold anticoagulants. Check stools for blood. Continue other treatments. If his stools are positive for blood he may need GI consultation. Add pantoprazole continue IV fluids at current level    LOS: 1 day   Jaylin Benzel L 01/02/2017, 8:32 AM

## 2017-01-03 LAB — CBC WITH DIFFERENTIAL/PLATELET
BASOS ABS: 0 10*3/uL (ref 0.0–0.1)
BASOS PCT: 0 %
EOS PCT: 2 %
Eosinophils Absolute: 0.3 10*3/uL (ref 0.0–0.7)
HCT: 30.6 % — ABNORMAL LOW (ref 39.0–52.0)
Hemoglobin: 10.2 g/dL — ABNORMAL LOW (ref 13.0–17.0)
Lymphocytes Relative: 9 %
Lymphs Abs: 1.7 10*3/uL (ref 0.7–4.0)
MCH: 31 pg (ref 26.0–34.0)
MCHC: 33.3 g/dL (ref 30.0–36.0)
MCV: 93 fL (ref 78.0–100.0)
MONO ABS: 1.2 10*3/uL — AB (ref 0.1–1.0)
Monocytes Relative: 7 %
Neutro Abs: 15 10*3/uL — ABNORMAL HIGH (ref 1.7–7.7)
Neutrophils Relative %: 82 %
PLATELETS: 246 10*3/uL (ref 150–400)
RBC: 3.29 MIL/uL — ABNORMAL LOW (ref 4.22–5.81)
RDW: 18.5 % — AB (ref 11.5–15.5)
WBC: 18.2 10*3/uL — ABNORMAL HIGH (ref 4.0–10.5)

## 2017-01-03 LAB — BASIC METABOLIC PANEL
ANION GAP: 7 (ref 5–15)
BUN: 30 mg/dL — ABNORMAL HIGH (ref 6–20)
CALCIUM: 7.7 mg/dL — AB (ref 8.9–10.3)
CO2: 20 mmol/L — ABNORMAL LOW (ref 22–32)
CREATININE: 1.11 mg/dL (ref 0.61–1.24)
Chloride: 113 mmol/L — ABNORMAL HIGH (ref 101–111)
GFR calc Af Amer: >60 mL/min (ref >60)
GLUCOSE: 107 mg/dL — AB (ref 65–99)
Potassium: 3.1 mmol/L — ABNORMAL LOW (ref 3.5–5.1)
Sodium: 140 mmol/L (ref 135–145)

## 2017-01-03 LAB — TYPE AND SCREEN
ABO/RH(D): B POS
ANTIBODY SCREEN: NEGATIVE
UNIT DIVISION: 0
UNIT DIVISION: 0

## 2017-01-03 LAB — BPAM RBC
BLOOD PRODUCT EXPIRATION DATE: 201809012359
Blood Product Expiration Date: 201809122359
ISSUE DATE / TIME: 201808091346
ISSUE DATE / TIME: 201808091005
UNIT TYPE AND RH: 5100
Unit Type and Rh: 5100

## 2017-01-03 LAB — PROCALCITONIN: PROCALCITONIN: 0.1 ng/mL

## 2017-01-03 MED ORDER — POTASSIUM CHLORIDE 20 MEQ/15ML (10%) PO SOLN
20.0000 meq | Freq: Two times a day (BID) | ORAL | Status: AC
  Administered 2017-01-03: 10 meq via ORAL
  Administered 2017-01-03 – 2017-01-04 (×2): 20 meq via ORAL
  Filled 2017-01-03 (×3): qty 30

## 2017-01-03 NOTE — Progress Notes (Signed)
Subjective: He's awake and alert. No new complaints. He still feels very weak. He received blood transfusion yesterday.  Objective: Vital signs in last 24 hours: Temp:  [97.5 F (36.4 C)-98.4 F (36.9 C)] 97.5 F (36.4 C) (08/10 0600) Pulse Rate:  [69-108] 78 (08/09 2200) Resp:  [9-25] 15 (08/09 2200) BP: (73-113)/(41-75) 107/55 (08/09 2200) SpO2:  [88 %-100 %] 95 % (08/09 2200) Weight:  [53 kg (116 lb 13.5 oz)] 53 kg (116 lb 13.5 oz) (08/10 0600) Weight change: -4.153 kg (-9 lb 2.5 oz) Last BM Date: 12/30/16  Intake/Output from previous day: 08/09 0701 - 08/10 0700 In: 1752 [I.V.:750; Blood:1002] Out: 1200 [Urine:1200]  PHYSICAL EXAM General appearance: alert, cooperative, mild distress and Very thin Resp: rhonchi bilaterally Cardio: irregularly irregular rhythm GI: soft, non-tender; bowel sounds normal; no masses,  no organomegaly Extremities: 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 Skin turgor better  Lab Results:  Results for orders placed or performed during the hospital encounter of 01/01/17 (from the past 48 hour(s))  CBC with Differential/Platelet     Status: Abnormal   Collection Time: 01/01/17  8:49 AM  Result Value Ref Range   WBC 32.2 (H) 4.0 - 10.5 K/uL   RBC 2.43 (L) 4.22 - 5.81 MIL/uL   Hemoglobin 8.0 (L) 13.0 - 17.0 g/dL   HCT 26.4 (L) 39.0 - 52.0 %   MCV 108.6 (H) 78.0 - 100.0 fL   MCH 32.9 26.0 - 34.0 pg   MCHC 30.3 30.0 - 36.0 g/dL   RDW 15.1 11.5 - 15.5 %   Platelets 330 150 - 400 K/uL   Neutrophils Relative % 92 %   Lymphocytes Relative 2 %   Monocytes Relative 6 %   Eosinophils Relative 0 %   Basophils Relative 0 %   Neutro Abs 29.7 (H) 1.7 - 7.7 K/uL   Lymphs Abs 0.6 (L) 0.7 - 4.0 K/uL   Monocytes Absolute 1.9 (H) 0.1 - 1.0 K/uL   Eosinophils Absolute 0.0 0.0 - 0.7 K/uL   Basophils Absolute 0.0 0.0 - 0.1 K/uL   WBC Morphology WHITE COUNT CONFIRMED ON SMEAR   Comprehensive metabolic panel     Status:  Abnormal   Collection Time: 01/01/17  8:49 AM  Result Value Ref Range   Sodium 135 135 - 145 mmol/L   Potassium 4.0 3.5 - 5.1 mmol/L   Chloride 101 101 - 111 mmol/L   CO2 20 (L) 22 - 32 mmol/L   Glucose, Bld 136 (H) 65 - 99 mg/dL   BUN 55 (H) 6 - 20 mg/dL   Creatinine, Ser 2.37 (H) 0.61 - 1.24 mg/dL   Calcium 8.6 (L) 8.9 - 10.3 mg/dL   Total Protein 6.9 6.5 - 8.1 g/dL   Albumin 3.4 (L) 3.5 - 5.0 g/dL   AST 32 15 - 41 U/L   ALT 14 (L) 17 - 63 U/L   Alkaline Phosphatase 56 38 - 126 U/L   Total Bilirubin 0.9 0.3 - 1.2 mg/dL   GFR calc non Af Amer 25 (L) >60 mL/min   GFR calc Af Amer 29 (L) >60 mL/min    Comment: (NOTE) The eGFR has been calculated using the CKD EPI equation. This calculation has not been validated in all clinical situations. eGFR's persistently <60 mL/min signify possible Chronic Kidney Disease.    Anion gap 14 5 - 15  Culture, blood (Routine X 2) w Reflex to ID Panel     Status: None (Preliminary result)  Collection Time: 01/01/17  8:49 AM  Result Value Ref Range   Specimen Description BLOOD LEFT ARM    Special Requests      BOTTLES DRAWN AEROBIC AND ANAEROBIC Blood Culture results may not be optimal due to an excessive volume of blood received in culture bottles   Culture NO GROWTH 2 DAYS    Report Status PENDING   Culture, blood (Routine X 2) w Reflex to ID Panel     Status: None (Preliminary result)   Collection Time: 01/01/17  8:57 AM  Result Value Ref Range   Specimen Description BLOOD LEFT ARM    Special Requests      BOTTLES DRAWN AEROBIC AND ANAEROBIC Blood Culture adequate volume   Culture NO GROWTH 2 DAYS    Report Status PENDING   I-Stat CG4 Lactic Acid, ED     Status: None   Collection Time: 01/01/17 11:10 AM  Result Value Ref Range   Lactic Acid, Venous 1.66 0.5 - 1.9 mmol/L  I-Stat CG4 Lactic Acid, ED     Status: Abnormal   Collection Time: 01/01/17 11:51 AM  Result Value Ref Range   Lactic Acid, Venous 1.97 (H) 0.5 - 1.9 mmol/L   Protime-INR     Status: Abnormal   Collection Time: 01/01/17  1:32 PM  Result Value Ref Range   Prothrombin Time 22.2 (H) 11.4 - 15.2 seconds   INR 1.92   APTT     Status: Abnormal   Collection Time: 01/01/17  1:32 PM  Result Value Ref Range   aPTT 45 (H) 24 - 36 seconds    Comment:        IF BASELINE aPTT IS ELEVATED, SUGGEST PATIENT RISK ASSESSMENT BE USED TO DETERMINE APPROPRIATE ANTICOAGULANT THERAPY.   Procalcitonin - Baseline     Status: None   Collection Time: 01/01/17  1:32 PM  Result Value Ref Range   Procalcitonin 0.20 ng/mL    Comment:        Interpretation: PCT (Procalcitonin) <= 0.5 ng/mL: Systemic infection (sepsis) is not likely. Local bacterial infection is possible. (NOTE)         ICU PCT Algorithm               Non ICU PCT Algorithm    ----------------------------     ------------------------------         PCT < 0.25 ng/mL                 PCT < 0.1 ng/mL     Stopping of antibiotics            Stopping of antibiotics       strongly encouraged.               strongly encouraged.    ----------------------------     ------------------------------       PCT level decrease by               PCT < 0.25 ng/mL       >= 80% from peak PCT       OR PCT 0.25 - 0.5 ng/mL          Stopping of antibiotics                                             encouraged.     Stopping of antibiotics  encouraged.    ----------------------------     ------------------------------       PCT level decrease by              PCT >= 0.25 ng/mL       < 80% from peak PCT        AND PCT >= 0.5 ng/mL            Continuin g antibiotics                                              encouraged.       Continuing antibiotics            encouraged.    ----------------------------     ------------------------------     PCT level increase compared          PCT > 0.5 ng/mL         with peak PCT AND          PCT >= 0.5 ng/mL             Escalation of antibiotics                                           strongly encouraged.      Escalation of antibiotics        strongly encouraged.   Vancomycin, random     Status: None   Collection Time: 01/01/17  2:03 PM  Result Value Ref Range   Vancomycin Rm 16     Comment:        Random Vancomycin therapeutic range is dependent on dosage and time of specimen collection. A peak range is 20.0-40.0 ug/mL A trough range is 5.0-15.0 ug/mL          MRSA PCR Screening     Status: None   Collection Time: 01/01/17  2:35 PM  Result Value Ref Range   MRSA by PCR NEGATIVE NEGATIVE    Comment:        The GeneXpert MRSA Assay (FDA approved for NASAL specimens only), is one component of a comprehensive MRSA colonization surveillance program. It is not intended to diagnose MRSA infection nor to guide or monitor treatment for MRSA infections.   Urinalysis, Routine w reflex microscopic     Status: Abnormal   Collection Time: 01/01/17  2:50 PM  Result Value Ref Range   Color, Urine RED (A) YELLOW    Comment: BIOCHEMICALS MAY BE AFFECTED BY COLOR   APPearance HAZY (A) CLEAR   Specific Gravity, Urine 1.015 1.005 - 1.030   pH 7.0 5.0 - 8.0   Glucose, UA 100 (A) NEGATIVE mg/dL   Hgb urine dipstick LARGE (A) NEGATIVE   Bilirubin Urine LARGE (A) NEGATIVE   Ketones, ur 15 (A) NEGATIVE mg/dL   Protein, ur >300 (A) NEGATIVE mg/dL   Nitrite POSITIVE (A) NEGATIVE   Leukocytes, UA MODERATE (A) NEGATIVE  Urinalysis, Microscopic (reflex)     Status: Abnormal   Collection Time: 01/01/17  2:50 PM  Result Value Ref Range   RBC / HPF TOO NUMEROUS TO COUNT 0 - 5 RBC/hpf   WBC, UA 6-30 0 - 5 WBC/hpf   Bacteria, UA MANY (A) NONE SEEN   Squamous Epithelial / LPF 0-5 (A)  NONE SEEN  Basic metabolic panel     Status: Abnormal   Collection Time: 01/02/17  6:12 AM  Result Value Ref Range   Sodium 139 135 - 145 mmol/L   Potassium 3.8 3.5 - 5.1 mmol/L   Chloride 112 (H) 101 - 111 mmol/L   CO2 20 (L) 22 - 32 mmol/L   Glucose, Bld 141 (H) 65 - 99 mg/dL    BUN 41 (H) 6 - 20 mg/dL   Creatinine, Ser 1.44 (H) 0.61 - 1.24 mg/dL   Calcium 7.6 (L) 8.9 - 10.3 mg/dL   GFR calc non Af Amer 45 (L) >60 mL/min   GFR calc Af Amer 53 (L) >60 mL/min    Comment: (NOTE) The eGFR has been calculated using the CKD EPI equation. This calculation has not been validated in all clinical situations. eGFR's persistently <60 mL/min signify possible Chronic Kidney Disease.    Anion gap 7 5 - 15  CBC     Status: Abnormal   Collection Time: 01/02/17  6:12 AM  Result Value Ref Range   WBC 24.3 (H) 4.0 - 10.5 K/uL   RBC 1.84 (L) 4.22 - 5.81 MIL/uL   Hemoglobin 6.0 (LL) 13.0 - 17.0 g/dL    Comment: RESULT REPEATED AND VERIFIED CRITICAL RESULT CALLED TO, READ BACK BY AND VERIFIED WITH: ROBERTS,T AT 0710 BY HUFFINES,S ON 01/02/17.    HCT 18.2 (L) 39.0 - 52.0 %   MCV 98.9 78.0 - 100.0 fL    Comment: DELTA CHECK NOTED   MCH 32.6 26.0 - 34.0 pg   MCHC 33.0 30.0 - 36.0 g/dL   RDW 14.9 11.5 - 15.5 %   Platelets 265 150 - 400 K/uL  Procalcitonin     Status: None   Collection Time: 01/02/17  6:12 AM  Result Value Ref Range   Procalcitonin 0.15 ng/mL    Comment:        Interpretation: PCT (Procalcitonin) <= 0.5 ng/mL: Systemic infection (sepsis) is not likely. Local bacterial infection is possible. (NOTE)         ICU PCT Algorithm               Non ICU PCT Algorithm    ----------------------------     ------------------------------         PCT < 0.25 ng/mL                 PCT < 0.1 ng/mL     Stopping of antibiotics            Stopping of antibiotics       strongly encouraged.               strongly encouraged.    ----------------------------     ------------------------------       PCT level decrease by               PCT < 0.25 ng/mL       >= 80% from peak PCT       OR PCT 0.25 - 0.5 ng/mL          Stopping of antibiotics                                             encouraged.     Stopping of antibiotics           encouraged.    ----------------------------      ------------------------------  PCT level decrease by              PCT >= 0.25 ng/mL       < 80% from peak PCT        AND PCT >= 0.5 ng/mL            Continuin g antibiotics                                              encouraged.       Continuing antibiotics            encouraged.    ----------------------------     ------------------------------     PCT level increase compared          PCT > 0.5 ng/mL         with peak PCT AND          PCT >= 0.5 ng/mL             Escalation of antibiotics                                          strongly encouraged.      Escalation of antibiotics        strongly encouraged.   ABO/Rh     Status: None   Collection Time: 01/02/17  8:15 AM  Result Value Ref Range   ABO/RH(D) B POS   Type and screen [facility]     Status: None   Collection Time: 01/02/17  8:21 AM  Result Value Ref Range   ABO/RH(D) B POS    Antibody Screen NEG    Sample Expiration 01/05/2017    Unit Number [id number]    Blood Component Type RED CELLS,LR    Unit division 00    Status of Unit ISSUED,FINAL    Transfusion Status OK TO TRANSFUSE    Crossmatch Result Compatible    Unit Number [id number]    Blood Component Type RED CELLS,LR    Unit division 00    Status of Unit ISSUED,FINAL    Transfusion Status OK TO TRANSFUSE    Crossmatch Result Compatible   Prepare RBC     Status: None   Collection Time: 01/02/17  8:23 AM  Result Value Ref Range   Order Confirmation ORDER PROCESSED BY BLOOD BANK   Hemoglobin and hematocrit, blood     Status: Abnormal   Collection Time: 01/02/17  7:16 PM  Result Value Ref Range   Hemoglobin 9.6 (L) 13.0 - 17.0 g/dL    Comment: DELTA CHECK NOTED   HCT 27.9 (L) 39.0 - 52.0 %  Procalcitonin     Status: None   Collection Time: 01/03/17  4:15 AM  Result Value Ref Range   Procalcitonin 0.10 ng/mL  CBC with Differential/Platelet     Status: Abnormal   Collection Time: 01/03/17  4:15 AM  Result Value Ref Range   WBC 18.2  (H) 4.0 - 10.5 K/uL   RBC 3.29 (L) 4.22 - 5.81 MIL/uL   Hemoglobin 10.2 (L) 13.0 - 17.0 g/dL   HCT 30.6 (L) 39.0 - 52.0 %   MCV 93.0 78.0 - 100.0 fL   MCH 31.0 26.0 - 34.0 pg   MCHC 33.3 30.0 - 36.0 g/dL  RDW 18.5 (H) 11.5 - 15.5 %   Platelets 246 150 - 400 K/uL   Neutrophils Relative % 82 %   Neutro Abs 15.0 (H) 1.7 - 7.7 K/uL   Lymphocytes Relative 9 %   Lymphs Abs 1.7 0.7 - 4.0 K/uL   Monocytes Relative 7 %   Monocytes Absolute 1.2 (H) 0.1 - 1.0 K/uL   Eosinophils Relative 2 %   Eosinophils Absolute 0.3 0.0 - 0.7 K/uL   Basophils Relative 0 %   Basophils Absolute 0.0 0.0 - 0.1 K/uL  Basic metabolic panel     Status: Abnormal   Collection Time: 01/03/17  4:15 AM  Result Value Ref Range   Sodium 140 135 - 145 mmol/L   Potassium 3.1 (L) 3.5 - 5.1 mmol/L    Comment: DELTA CHECK NOTED   Chloride 113 (H) 101 - 111 mmol/L   CO2 20 (L) 22 - 32 mmol/L   Glucose, Bld 107 (H) 65 - 99 mg/dL   BUN 30 (H) 6 - 20 mg/dL   Creatinine, Ser 1.11 0.61 - 1.24 mg/dL   Calcium 7.7 (L) 8.9 - 10.3 mg/dL   GFR calc non Af Amer >60 >60 mL/min   GFR calc Af Amer >60 >60 mL/min    Comment: (NOTE) The eGFR has been calculated using the CKD EPI equation. This calculation has not been validated in all clinical situations. eGFR's persistently <60 mL/min signify possible Chronic Kidney Disease.    Anion gap 7 5 - 15    ABGS No results for input(s): PHART, PO2ART, TCO2, HCO3 in the last 72 hours.  Invalid input(s): PCO2 CULTURES Recent Results (from the past 240 hour(s))  Culture, Urine     Status: Abnormal   Collection Time: 12/24/16  1:30 PM  Result Value Ref Range Status   Specimen Description URINE, CATHETERIZED  Final   Special Requests NONE  Final   Culture ENTEROCOCCUS FAECALIS (A)  Final   Report Status 12/27/2016 FINAL  Final   Organism ID, Bacteria ENTEROCOCCUS FAECALIS  Final      Susceptibility   Enterococcus faecalis - MIC*    AMPICILLIN <=2 SENSITIVE Sensitive      LEVOFLOXACIN >=8 RESISTANT Resistant     NITROFURANTOIN 128 RESISTANT Resistant     VANCOMYCIN 1 SENSITIVE Sensitive     * ENTEROCOCCUS FAECALIS  Urine culture     Status: Abnormal   Collection Time: 12/28/16 11:24 AM  Result Value Ref Range Status   Specimen Description URINE, CLEAN CATCH  Final   Special Requests Normal  Final   Culture >=100,000 COLONIES/mL ENTEROCOCCUS FAECALIS (A)  Final   Report Status 12/30/2016 FINAL  Final   Organism ID, Bacteria ENTEROCOCCUS FAECALIS (A)  Final      Susceptibility   Enterococcus faecalis - MIC*    AMPICILLIN <=2 SENSITIVE Sensitive     LEVOFLOXACIN >=8 RESISTANT Resistant     NITROFURANTOIN 128 RESISTANT Resistant     VANCOMYCIN 1 SENSITIVE Sensitive     * >=100,000 COLONIES/mL ENTEROCOCCUS FAECALIS  Culture, blood (Routine X 2) w Reflex to ID Panel     Status: None (Preliminary result)   Collection Time: 01/01/17  8:49 AM  Result Value Ref Range Status   Specimen Description BLOOD LEFT ARM  Final   Special Requests   Final    BOTTLES DRAWN AEROBIC AND ANAEROBIC Blood Culture results may not be optimal due to an excessive volume of blood received in culture bottles   Culture NO GROWTH 2 DAYS  Final   Report Status PENDING  Incomplete  Culture, blood (Routine X 2) w Reflex to ID Panel     Status: None (Preliminary result)   Collection Time: 01/01/17  8:57 AM  Result Value Ref Range Status   Specimen Description BLOOD LEFT ARM  Final   Special Requests   Final    BOTTLES DRAWN AEROBIC AND ANAEROBIC Blood Culture adequate volume   Culture NO GROWTH 2 DAYS  Final   Report Status PENDING  Incomplete  MRSA PCR Screening     Status: None   Collection Time: 01/01/17  2:35 PM  Result Value Ref Range Status   MRSA by PCR NEGATIVE NEGATIVE Final    Comment:        The GeneXpert MRSA Assay (FDA approved for NASAL specimens only), is one component of a comprehensive MRSA colonization surveillance program. It is not intended to diagnose  MRSA infection nor to guide or monitor treatment for MRSA infections.    Studies/Results: Ct Head Wo Contrast  Result Date: 01/01/2017 CLINICAL DATA:  Altered mental status. EXAM: CT HEAD WITHOUT CONTRAST TECHNIQUE: Contiguous axial images were obtained from the base of the skull through the vertex without intravenous contrast. COMPARISON:  06/27/2016 FINDINGS: Brain: There is no evidence for acute hemorrhage, hydrocephalus, mass lesion, or abnormal extra-axial fluid collection. No definite CT evidence for acute infarction. Diffuse loss of parenchymal volume is consistent with atrophy. Patchy low attenuation in the deep hemispheric and periventricular white matter is nonspecific, but likely reflects chronic microvascular ischemic demyelination. Vascular: No hyperdense vessel or unexpected calcification. Skull: No evidence for fracture. No worrisome lytic or sclerotic lesion. Sinuses/Orbits: The visualized paranasal sinuses and mastoid air cells are clear. Visualized portions of the globes and intraorbital fat are unremarkable. Other: None. IMPRESSION: 1. Stable.  No acute intracranial abnormality. 2. Atrophy with chronic small vessel white matter ischemic disease. Electronically Signed   By: Misty Stanley M.D.   On: 01/01/2017 09:50   Dg Chest Port 1 View  Result Date: 01/01/2017 CLINICAL DATA:  PICC line placed on Sat/copd/hx pneumonia/htn/ex smoker EXAM: PORTABLE CHEST 1 VIEW COMPARISON:  12/28/2016. FINDINGS: PICC line terminates at the mid to low SVC. Midline trachea. Normal heart size. Atherosclerosis in the transverse aorta. No pleural effusion or pneumothorax. The Chin overlies the apices minimally. Diffuse peribronchial thickening. Mild hyperinflation. Prominent gas-filled bowel loops in the upper abdomen are incompletely imaged. IMPRESSION: No acute cardiopulmonary disease. Peribronchial thickening which may relate to chronic bronchitis or smoking. Electronically Signed   By: Abigail Miyamoto M.D.    On: 01/01/2017 09:06    Medications:  Prior to Admission:  Prescriptions Prior to Admission  Medication Sig Dispense Refill Last Dose  . ALPRAZolam (XANAX) 0.25 MG tablet Take 0.25 mg by mouth 5 (five) times daily as needed for anxiety.    12/31/2016 at Unknown time  . apixaban (ELIQUIS) 2.5 MG TABS tablet Take 1 tablet (2.5 mg total) by mouth 2 (two) times daily. 60 tablet  12/31/2016 at 2000  . CRANBERRY PO Take 1 tablet by mouth daily.   12/31/2016 at Unknown time  . diltiazem (CARDIZEM LA) 240 MG 24 hr tablet Take 1 tablet (240 mg total) by mouth daily.   12/31/2016 at Unknown time  . Docusate Calcium (STOOL SOFTENER PO) Take 1 tablet by mouth 2 (two) times daily.   01/01/2017 at Unknown time  . escitalopram (LEXAPRO) 10 MG tablet Take 1 tablet by mouth daily.   12/31/2016 at Unknown time  . HYDROcodone-acetaminophen (  NORCO) 10-325 MG tablet Take 1-2 tablets by mouth every 4 (four) hours as needed for pain.   01/01/2017 at Unknown time  . lidocaine (XYLOCAINE) 2 % jelly Apply 1 application topically once.   Past Month at Unknown time  . Meth-Hyo-M Bl-Na Phos-Ph Sal (URIBEL) 118 MG CAPS Take 1 capsule by mouth every 6 (six) hours.   12/31/2016 at Unknown time  . polyethylene glycol (MIRALAX / GLYCOLAX) packet Take 17 g by mouth daily as needed.   Past Week at Unknown time  . SPIRIVA HANDIHALER 18 MCG inhalation capsule Place 1 capsule into inhaler and inhale daily.   12/31/2016 at Unknown time  . torsemide (DEMADEX) 100 MG tablet Take 0.5 tablets by mouth daily.   12/31/2016 at Unknown time  . Tripelennamine HCl POWD Take 25 mg by mouth 2 (two) times daily.    12/31/2016 at Unknown time  . vancomycin 500 mg in sodium chloride 0.9 % 100 mL Inject 500 mg into the vein daily. 10 Dose 0 12/31/2016 at Unknown time   Scheduled: . docusate sodium  100 mg Oral BID  . linezolid  600 mg Oral Q12H  . pantoprazole (PROTONIX) IV  40 mg Intravenous Q24H  . potassium chloride  20 mEq Oral BID  . tiotropium  1 capsule  Inhalation Daily  . Tripelennamine HCl  25 mg Oral BID  . URIBEL  1 capsule Oral Q6H   Continuous: . sodium chloride 75 mL/hr at 01/01/17 1336  . aztreonam Stopped (01/03/17 7867)  . diltiazem (CARDIZEM) infusion 5 mg/hr (01/03/17 6720)   NOB:[id number] **OR** acetaminophen, ALPRAZolam, HYDROcodone-acetaminophen, ondansetron **OR** ondansetron (ZOFRAN) IV  Assesment: He was admitted with sepsis presumably from urinary tract infection. He has chronic indwelling Foley catheter because of neurogenic bladder and his muscular dystrophy. He is improving. Blood pressure still soft in the 80s. He has not required pressors. His white blood count has come down  He had acute metabolic encephalopathy and he still sleepy but he's back to his normal mental status.  He had atrial fibrillation rapid ventricular response and his heart rate is controlled now.  He has significant nutritional issues and has lost a substantial amount of weight in the last several months.  He has history of heart failure and has received fluid resuscitation so we have to be careful about getting him volume overloaded. For now I think we should continue current IV fluid rate  He has COPD at baseline and that seems relatively stable  He has severe weakness at baseline partially from his muscular dystrophy and partially from his recent multiple medical illnesses.  He had full CODE STATUS but that has been changed to DO NOT RESUSCITATE considering his overall situation. Discussed with patient but I'm not sure he totally understands and discussed with his wife who does understand and agrees  He has hypokalemia and that will be replaced  He was anemic with his hemoglobin down to 6 some of this may be from chronic disease but he also had bleeding in his urine and he has had his anticoagulant held. He has not had a stool to check for blood   Active Problems:   Sepsis ([place])   Sepsis due to undetermined organism ([place])   Acute  metabolic encephalopathy   Atrial fibrillation with RVR ([place])    Plan: Continue current treatments. Continue antibiotics. Replace potassium.    LOS: 2 days   Kayti Poss L 01/03/2017, 7:34 AM

## 2017-01-03 NOTE — Progress Notes (Signed)
Patient would only drink half of potassium in orange juice. Also gave medications that could be crushed in applesauce but he refused to take more than two pills one pain and one zyvox. Did not eat breakfast either. Flushed foley with 60 mls of sterile water and returned sediment. He is still complaining of burning with urination.

## 2017-01-03 NOTE — Evaluation (Signed)
Physical Therapy Evaluation Patient Details Name: Erik Atkins MRN: [id number] DOB: 1941-04-11 Today's Date: 01/03/2017   History of Present Illness  Erik Atkins is a 76yo white male who comes to [facility] on 8/8 after 2d AMS, weakness, and a fall at home. He was being treated for UTI. PMH: COPD, Charcot Marie Tooth, HTN, AF, CHF.   Clinical Impression  Pt admitted with above diagnosis. Pt currently with functional limitations due to the deficits listed below (see "PT Problem List"). Upon entry, the patient is received semirecumbent in bed, no family/caregiver present to assist with history. He has trouble speaking clearly, which improves after some sips of water, but remains limited due to baseline slurred speech and breathlessness. The pt is awake and agreeable to participate. He c/o feeling profoundly weak. The pt is lethargic, oriented to person, pleasant, conversational, and following simple commands consistently. Pt received on 2LPM O2 c SpO2 >92% at rest, but quickly dropping into 80's with simple rolling in bed and taking: pt is not on O2 at home. Manual muscle testing screening reveals heavy strength deficits as detailed below. Functional mobility assessment demonstrates heavy strength impairment in trunk and BLE, the pt now requiring minA-assist physical assistance for bed mobility, whereas the patient performed these at a higher level of independence PTA. No additional exertion is tried d/t hypoxia and tachycardia. Pt will benefit from skilled PT intervention to increase independence and safety with basic mobility in preparation for discharge to the venue listed below.       Follow Up Recommendations SNF;Supervision/Assistance - 24 hour    Equipment Recommendations  None recommended by PT    Recommendations for Other Services       Precautions / Restrictions Precautions Precautions: Fall Precaution Comments: no score available  Restrictions Weight Bearing Restrictions: No       Mobility  Bed Mobility Overal bed mobility: Needs Assistance Bed Mobility: Rolling Rolling: Min assist         General bed mobility comments: min assist for rolling in bed, SpO2 drops into 80s% and pt becomes SOB.   Transfers                    Ambulation/Gait                Stairs            Wheelchair Mobility    Modified Rankin (Stroke Patients Only)       Balance                                             Pertinent Vitals/Pain Pain Assessment: Faces Faces Pain Scale: Hurts even more Pain Location: BLE hurt with movement, mostly where the SCD are attached Pain Intervention(s): Limited activity within patient's tolerance;Monitored during session;Premedicated before session    [place] expects to be discharged to:: Private residence Living Arrangements: Spouse/significant other Available Help at Discharge: Available 24 hours/day;Family Type of Home: House Home Access: Stairs to enter Entrance Stairs-Rails: Right Entrance Stairs-Number of Steps: 2 Home Layout: One level Home Equipment: Bedside commode;Shower seat;Walker - 4 wheels Additional Comments: total care for ADL; pt sleeps in a recliner.     Prior Function Level of Independence: Needs assistance   Gait / Transfers Assistance Needed: Somewhat confused, but sounds as though he scotts throughout the house seated on his rollator, which he refers  to as his "four-wheeler"   ADL's / Homemaking Assistance Needed: Wife assists with ADL.         Hand Dominance   Dominant Hand: Right    Extremity/Trunk Assessment   Upper Extremity Assessment Upper Extremity Assessment: Generalized weakness (grossly 3+/5 bilat )    Lower Extremity Assessment Lower Extremity Assessment: Generalized weakness (Grossly 4-/5 Bilat)    Cervical / Trunk Assessment Cervical / Trunk Assessment: Kyphotic (forward head posture )  Communication   Communication:  Expressive difficulties  Cognition Arousal/Alertness: Lethargic Behavior During Therapy: WFL for tasks assessed/performed Overall Cognitive Status: Within Functional Limits for tasks assessed                                        General Comments      Exercises     Assessment/Plan    PT Assessment Patient needs continued PT services  PT Problem List Decreased strength;Decreased activity tolerance;Decreased mobility       PT Treatment Interventions Therapeutic activities;Therapeutic exercise;Functional mobility training;Patient/family education    PT Goals (Current goals can be found in the Care Plan section)  Acute Rehab PT Goals Patient Stated Goal: Pt says he does not want to die  PT Goal Formulation: With patient Time For Goal Achievement: 01/17/17 Potential to Achieve Goals: Good    Frequency Min 2X/week   Barriers to discharge Decreased caregiver support;Inaccessible home environment      Co-evaluation               AM-PAC PT "6 Clicks" Daily Activity  Outcome Measure Difficulty turning over in bed (including adjusting bedclothes, sheets and blankets)?: Total Difficulty moving from lying on back to sitting on the side of the bed? : Total Difficulty sitting down on and standing up from a chair with arms (e.g., wheelchair, bedside commode, etc,.)?: Total Help needed moving to and from a bed to chair (including a wheelchair)?: Total Help needed walking in hospital room?: Total Help needed climbing 3-5 steps with a railing? : Total 6 Click Score: 6    End of Session Equipment Utilized During Treatment: Oxygen Activity Tolerance: Patient limited by fatigue Patient left: in bed;with SCD's reapplied;with call bell/phone within reach Nurse Communication: Mobility status PT Visit Diagnosis: Muscle weakness (generalized) (M62.81);Difficulty in walking, not elsewhere classified (R26.2)    Time: 3536-1443 PT Time Calculation (min) (ACUTE ONLY):  17 min   Charges:   PT Evaluation $PT Eval High Complexity: 1 High     PT G Codes:        4:24 PM, 2017/02/01 Etta Grandchild, PT, DPT Physical Therapist - [place] [phone] (Office)    Buccola,Allan C 02/01/2017, 4:20 PM

## 2017-01-03 NOTE — Evaluation (Signed)
Clinical/Bedside Swallow Evaluation Patient Details  Name: Erik Atkins MRN: [id number] Date of Birth: 1940/10/25  Today's Date: 01/03/2017 Time: SLP Start Time (ACUTE ONLY): 1045 SLP Stop Time (ACUTE ONLY): 1102 SLP Time Calculation (min) (ACUTE ONLY): 17 min  Past Medical History:  Past Medical History:  Diagnosis Date  . Anxiety   . Charcot Marie Tooth muscular atrophy   . COPD (chronic obstructive pulmonary disease) ([place])   . Hemorrhoids   . History of pneumonia   . Hypertension   . Nerve disorder 1996   "Charot Marie"  . Neuropathy   . Tubular adenoma    Past Surgical History:  Past Surgical History:  Procedure Laterality Date  . CATARACT EXTRACTION, BILATERAL    . COLONOSCOPY  04/16/2010   Dr.Rourk- multiple cecal polyps, remainder of the colonic mucosa and rectal mucosa appeared normal. internal hemorrhoids bx= tubular adenoma  . MASTECTOMY FOR GYNECOMASTIA  1979 and 1981  . POSTERIOR CERVICAL FUSION/FORAMINOTOMY  02/06/2012   Procedure: POSTERIOR CERVICAL FUSION/FORAMINOTOMY LEVEL 2;  Surgeon: Ophelia Charter, MD;  Location: [facility];  Service: Neurosurgery;  Laterality: N/A;  Posterior Cervical Three-Four/Four-Five Laminectomy and Fusion with Instrumentation  . TONSILLECTOMY     HPI:  Pt is a 76 y.o. male with PMH of COPD, Charcot-Marie-Tooth muscular atrophy, hypertension, and atrial fibrillation presenting with confusion for the past 1-2 days; recent d/c from hospital on 8/6 with UTI. Soon after d/c, pt with increasing lethargy, confusion, decreased oral intake. CXR showed nothing acute; "Peribronchial thickening which may relate to chronic bronchitis or smoking." Head CT showed nothing acute. Pt has had substantial weight loss over the past several months and severe weakness given muscular atrophy. RN this am reported pt not eating today, only drank half of orange juice. Bedside swallow eval ordered.    Assessment / Plan / Recommendation Clinical Impression   Pt had a delayed cough x1 following trial of regular solid along with prolonged mastication, generalized weakness, dysarthria; not sure whether dysarthria is new or pt's baseline. Pt expressed having greater difficulty with swallowing while in the hospital due to eating in bed rather than in a chair. Given these findings, aspiration risk is increased. Recommend downgrading diet to dysphagia 3, thin liquids, meds whole in puree, ensure pt fully upright, full supervision to assist with feeding. Will f/u to ensure diet tolerance/ consider advancement.  SLP Visit Diagnosis: Dysphagia, unspecified (R13.10)    Aspiration Risk  Mild aspiration risk    Diet Recommendation Dysphagia 3 (Mech soft);Thin liquid   Liquid Administration via: Cup;Straw Medication Administration: Whole meds with puree Supervision: Staff to assist with self feeding;Full supervision/cueing for compensatory strategies Compensations: Slow rate;Small sips/bites Postural Changes: Seated upright at 90 degrees    Other  Recommendations Oral Care Recommendations: Oral care BID   Follow up Recommendations 24 hour supervision/assistance      Frequency and Duration min 1 x/week  1 week       Prognosis Prognosis for Safe Diet Advancement: Fair Barriers to Reach Goals: Cognitive deficits      Swallow Study   General HPI: Pt is a 76 y.o. male with PMH of COPD, Charcot-Marie-Tooth muscular atrophy, hypertension, and atrial fibrillation presenting with confusion for the past 1-2 days; recent d/c from hospital on 8/6 with UTI. Soon after d/c, pt with increasing lethargy, confusion, decreased oral intake. CXR showed nothing acute; "Peribronchial thickening which may relate to chronic bronchitis or smoking." Head CT showed nothing acute. Pt has had substantial weight loss over the  past several months and severe weakness given muscular atrophy. RN this am reported pt not eating today, only drank half of orange juice. Bedside swallow eval  ordered.  Type of Study: Bedside Swallow Evaluation Previous Swallow Assessment: 06/28/16- dysphagia 3/ thins due to weakness Diet Prior to this Study: Regular;Thin liquids Temperature Spikes Noted: No Respiratory Status: Room air History of Recent Intubation: No Behavior/Cognition: Alert;Cooperative;Requires cueing Oral Cavity Assessment: Within Functional Limits Oral Cavity - Dentition: Adequate natural dentition Vision: Functional for self-feeding Self-Feeding Abilities: Needs assist Patient Positioning: Upright in bed Baseline Vocal Quality: Normal Volitional Cough: Strong Volitional Swallow: Able to elicit    Oral/Motor/Sensory Function Overall Oral Motor/Sensory Function: Mild impairment Facial ROM: Reduced left Facial Symmetry: Abnormal symmetry left   Ice Chips Ice chips: Not tested   Thin Liquid Thin Liquid: Within functional limits Presentation: Cup;Straw    Nectar Thick Nectar Thick Liquid: Not tested   Honey Thick Honey Thick Liquid: Not tested   Puree Puree: Within functional limits Presentation: Spoon   Solid   GO   Solid: Impaired Presentation: Spoon Oral Phase Impairments: Impaired mastication Oral Phase Functional Implications: Impaired mastication Pharyngeal Phase Impairments: Cough - Delayed        Amy K Oleksiak, MA, CCC-SLP 01/03/2017,12:11 PM

## 2017-01-04 LAB — OCCULT BLOOD X 1 CARD TO LAB, STOOL: FECAL OCCULT BLD: POSITIVE — AB

## 2017-01-04 LAB — BASIC METABOLIC PANEL
ANION GAP: 7 (ref 5–15)
BUN: 21 mg/dL — AB (ref 6–20)
CALCIUM: 7.8 mg/dL — AB (ref 8.9–10.3)
CO2: 19 mmol/L — ABNORMAL LOW (ref 22–32)
Chloride: 117 mmol/L — ABNORMAL HIGH (ref 101–111)
Creatinine, Ser: 1.02 mg/dL (ref 0.61–1.24)
GFR calc Af Amer: >60 mL/min (ref >60)
GFR calc non Af Amer: >60 mL/min (ref >60)
GLUCOSE: 128 mg/dL — AB (ref 65–99)
POTASSIUM: 3.5 mmol/L (ref 3.5–5.1)
SODIUM: 143 mmol/L (ref 135–145)

## 2017-01-04 LAB — CBC WITH DIFFERENTIAL/PLATELET
BASOS PCT: 0 %
Basophils Absolute: 0 10*3/uL (ref 0.0–0.1)
EOS PCT: 2 %
Eosinophils Absolute: 0.6 10*3/uL (ref 0.0–0.7)
HCT: 33.7 % — ABNORMAL LOW (ref 39.0–52.0)
Hemoglobin: 11.2 g/dL — ABNORMAL LOW (ref 13.0–17.0)
LYMPHS ABS: 3.1 10*3/uL (ref 0.7–4.0)
LYMPHS PCT: 12 %
MCH: 31.7 pg (ref 26.0–34.0)
MCHC: 33.2 g/dL (ref 30.0–36.0)
MCV: 95.5 fL (ref 78.0–100.0)
Monocytes Absolute: 1.8 10*3/uL — ABNORMAL HIGH (ref 0.1–1.0)
Monocytes Relative: 7 %
NEUTROS PCT: 79 %
Neutro Abs: 20.8 10*3/uL — ABNORMAL HIGH (ref 1.7–7.7)
Platelets: 322 10*3/uL (ref 150–400)
RBC: 3.53 MIL/uL — AB (ref 4.22–5.81)
RDW: 19.2 % — AB (ref 11.5–15.5)
WBC: 26.3 10*3/uL — AB (ref 4.0–10.5)

## 2017-01-04 LAB — URINE CULTURE: Culture: >=100000 — AB

## 2017-01-04 MED ORDER — PANTOPRAZOLE SODIUM 40 MG PO TBEC
40.0000 mg | DELAYED_RELEASE_TABLET | ORAL | Status: DC
  Administered 2017-01-05: 40 mg via ORAL
  Filled 2017-01-04: qty 1

## 2017-01-04 MED ORDER — MIRTAZAPINE 15 MG PO TABS
7.5000 mg | ORAL_TABLET | Freq: Every day | ORAL | Status: DC
  Administered 2017-01-04: 7.5 mg via ORAL
  Filled 2017-01-04: qty 1
  Filled 2017-01-04: qty 0.5
  Filled 2017-01-04: qty 1
  Filled 2017-01-04: qty 0.5
  Filled 2017-01-04: qty 1

## 2017-01-04 NOTE — Progress Notes (Signed)
Subjective: He says he is having some pain in his penis. He is still not eating very well. He's had episodes of confusion. He's had atrial fibrillation rapid ventricular response this morning and he is on diltiazem.  Objective: Vital signs in last 24 hours: Temp:  [97.5 F (36.4 C)-98.3 F (36.8 C)] 97.5 F (36.4 C) (08/11 0805) Pulse Rate:  [63-115] 89 (08/11 0200) Resp:  [10-24] 22 (08/11 0200) BP: (83-120)/(47-66) 116/63 (08/11 0200) SpO2:  [85 %-100 %] 96 % (08/11 0819) FiO2 (%):  [28 %] 28 % (08/10 2000) Weight change:  Last BM Date: 12/30/16  Intake/Output from previous day: 08/10 0701 - 08/11 0700 In: 5132.1 [P.O.:500; I.V.:4582.1; IV Piggyback:50] Out: 800 [Urine:800]  PHYSICAL EXAM General appearance: alert, cooperative and moderate distress Resp: rhonchi bilaterally Cardio: irregularly irregular rhythm GI: soft, non-tender; bowel sounds normal; no masses,  no organomegaly Extremities: extremities normal, atraumatic, no cyanosis or edema Multiple bruises and skin lesions  Lab Results:  Results for orders placed or performed during the hospital encounter of 01/01/17 (from the past 48 hour(s))  Hemoglobin and hematocrit, blood     Status: Abnormal   Collection Time: 01/02/17  7:16 PM  Result Value Ref Range   Hemoglobin 9.6 (L) 13.0 - 17.0 g/dL    Comment: DELTA CHECK NOTED   HCT 27.9 (L) 39.0 - 52.0 %  Procalcitonin     Status: None   Collection Time: 01/03/17  4:15 AM  Result Value Ref Range   Procalcitonin 0.10 ng/mL  CBC with Differential/Platelet     Status: Abnormal   Collection Time: 01/03/17  4:15 AM  Result Value Ref Range   WBC 18.2 (H) 4.0 - 10.5 K/uL   RBC 3.29 (L) 4.22 - 5.81 MIL/uL   Hemoglobin 10.2 (L) 13.0 - 17.0 g/dL   HCT 30.6 (L) 39.0 - 52.0 %   MCV 93.0 78.0 - 100.0 fL   MCH 31.0 26.0 - 34.0 pg   MCHC 33.3 30.0 - 36.0 g/dL   RDW 18.5 (H) 11.5 - 15.5 %   Platelets 246 150 - 400 K/uL   Neutrophils Relative % 82 %   Neutro Abs 15.0 (H)  1.7 - 7.7 K/uL   Lymphocytes Relative 9 %   Lymphs Abs 1.7 0.7 - 4.0 K/uL   Monocytes Relative 7 %   Monocytes Absolute 1.2 (H) 0.1 - 1.0 K/uL   Eosinophils Relative 2 %   Eosinophils Absolute 0.3 0.0 - 0.7 K/uL   Basophils Relative 0 %   Basophils Absolute 0.0 0.0 - 0.1 K/uL  Basic metabolic panel     Status: Abnormal   Collection Time: 01/03/17  4:15 AM  Result Value Ref Range   Sodium 140 135 - 145 mmol/L   Potassium 3.1 (L) 3.5 - 5.1 mmol/L    Comment: DELTA CHECK NOTED   Chloride 113 (H) 101 - 111 mmol/L   CO2 20 (L) 22 - 32 mmol/L   Glucose, Bld 107 (H) 65 - 99 mg/dL   BUN 30 (H) 6 - 20 mg/dL   Creatinine, Ser 1.11 0.61 - 1.24 mg/dL   Calcium 7.7 (L) 8.9 - 10.3 mg/dL   GFR calc non Af Amer >60 >60 mL/min   GFR calc Af Amer >60 >60 mL/min    Comment: (NOTE) The eGFR has been calculated using the CKD EPI equation. This calculation has not been validated in all clinical situations. eGFR's persistently <60 mL/min signify possible Chronic Kidney Disease.    Anion gap 7 5 -  15  CBC with Differential/Platelet     Status: Abnormal   Collection Time: 01/04/17  4:42 AM  Result Value Ref Range   WBC 26.3 (H) 4.0 - 10.5 K/uL    Comment: WHITE COUNT CONFIRMED ON SMEAR   RBC 3.53 (L) 4.22 - 5.81 MIL/uL   Hemoglobin 11.2 (L) 13.0 - 17.0 g/dL   HCT 33.7 (L) 39.0 - 52.0 %   MCV 95.5 78.0 - 100.0 fL   MCH 31.7 26.0 - 34.0 pg   MCHC 33.2 30.0 - 36.0 g/dL   RDW 19.2 (H) 11.5 - 15.5 %   Platelets 322 150 - 400 K/uL   Neutrophils Relative % 79 %   Neutro Abs 20.8 (H) 1.7 - 7.7 K/uL   Lymphocytes Relative 12 %   Lymphs Abs 3.1 0.7 - 4.0 K/uL   Monocytes Relative 7 %   Monocytes Absolute 1.8 (H) 0.1 - 1.0 K/uL   Eosinophils Relative 2 %   Eosinophils Absolute 0.6 0.0 - 0.7 K/uL   Basophils Relative 0 %   Basophils Absolute 0.0 0.0 - 0.1 K/uL   WBC Morphology MILD LEFT SHIFT (1-5% METAS, OCC MYELO, OCC BANDS)     Comment: TOXIC GRANULATION  Basic metabolic panel     Status:  Abnormal   Collection Time: 01/04/17  4:42 AM  Result Value Ref Range   Sodium 143 135 - 145 mmol/L   Potassium 3.5 3.5 - 5.1 mmol/L   Chloride 117 (H) 101 - 111 mmol/L   CO2 19 (L) 22 - 32 mmol/L   Glucose, Bld 128 (H) 65 - 99 mg/dL   BUN 21 (H) 6 - 20 mg/dL   Creatinine, Ser 1.02 0.61 - 1.24 mg/dL   Calcium 7.8 (L) 8.9 - 10.3 mg/dL   GFR calc non Af Amer >60 >60 mL/min   GFR calc Af Amer >60 >60 mL/min    Comment: (NOTE) The eGFR has been calculated using the CKD EPI equation. This calculation has not been validated in all clinical situations. eGFR's persistently <60 mL/min signify possible Chronic Kidney Disease.    Anion gap 7 5 - 15    ABGS No results for input(s): PHART, PO2ART, TCO2, HCO3 in the last 72 hours.  Invalid input(s): PCO2 CULTURES Recent Results (from the past 240 hour(s))  Urine culture     Status: Abnormal   Collection Time: 12/28/16 11:24 AM  Result Value Ref Range Status   Specimen Description URINE, CLEAN CATCH  Final   Special Requests Normal  Final   Culture >=100,000 COLONIES/mL ENTEROCOCCUS FAECALIS (A)  Final   Report Status 12/30/2016 FINAL  Final   Organism ID, Bacteria ENTEROCOCCUS FAECALIS (A)  Final      Susceptibility   Enterococcus faecalis - MIC*    AMPICILLIN <=2 SENSITIVE Sensitive     LEVOFLOXACIN >=8 RESISTANT Resistant     NITROFURANTOIN 128 RESISTANT Resistant     VANCOMYCIN 1 SENSITIVE Sensitive     * >=100,000 COLONIES/mL ENTEROCOCCUS FAECALIS  Culture, blood (Routine X 2) w Reflex to ID Panel     Status: None (Preliminary result)   Collection Time: 01/01/17  8:49 AM  Result Value Ref Range Status   Specimen Description BLOOD LEFT ARM  Final   Special Requests   Final    BOTTLES DRAWN AEROBIC AND ANAEROBIC Blood Culture results may not be optimal due to an excessive volume of blood received in culture bottles   Culture NO GROWTH 2 DAYS  Final   Report Status  PENDING  Incomplete  Culture, blood (Routine X 2) w Reflex to ID  Panel     Status: None (Preliminary result)   Collection Time: 01/01/17  8:57 AM  Result Value Ref Range Status   Specimen Description BLOOD LEFT ARM  Final   Special Requests   Final    BOTTLES DRAWN AEROBIC AND ANAEROBIC Blood Culture adequate volume   Culture NO GROWTH 2 DAYS  Final   Report Status PENDING  Incomplete  MRSA PCR Screening     Status: None   Collection Time: 01/01/17  2:35 PM  Result Value Ref Range Status   MRSA by PCR NEGATIVE NEGATIVE Final    Comment:        The GeneXpert MRSA Assay (FDA approved for NASAL specimens only), is one component of a comprehensive MRSA colonization surveillance program. It is not intended to diagnose MRSA infection nor to guide or monitor treatment for MRSA infections.   Culture, Urine     Status: Abnormal   Collection Time: 01/01/17  2:50 PM  Result Value Ref Range Status   Specimen Description URINE, CLEAN CATCH  Final   Special Requests NONE  Final   Culture >=100,000 COLONIES/mL ENTEROCOCCUS FAECALIS (A)  Final   Report Status 01/04/2017 FINAL  Final   Organism ID, Bacteria ENTEROCOCCUS FAECALIS (A)  Final      Susceptibility   Enterococcus faecalis - MIC*    AMPICILLIN <=2 SENSITIVE Sensitive     LEVOFLOXACIN >=8 RESISTANT Resistant     NITROFURANTOIN 128 RESISTANT Resistant     VANCOMYCIN 1 SENSITIVE Sensitive     * >=100,000 COLONIES/mL ENTEROCOCCUS FAECALIS   Studies/Results: No results found.  Medications:  Prior to Admission:  Prescriptions Prior to Admission  Medication Sig Dispense Refill Last Dose  . ALPRAZolam (XANAX) 0.25 MG tablet Take 0.25 mg by mouth 5 (five) times daily as needed for anxiety.    12/31/2016 at Unknown time  . apixaban (ELIQUIS) 2.5 MG TABS tablet Take 1 tablet (2.5 mg total) by mouth 2 (two) times daily. 60 tablet  12/31/2016 at 2000  . CRANBERRY PO Take 1 tablet by mouth daily.   12/31/2016 at Unknown time  . diltiazem (CARDIZEM LA) 240 MG 24 hr tablet Take 1 tablet (240 mg total) by mouth  daily.   12/31/2016 at Unknown time  . Docusate Calcium (STOOL SOFTENER PO) Take 1 tablet by mouth 2 (two) times daily.   01/01/2017 at Unknown time  . escitalopram (LEXAPRO) 10 MG tablet Take 1 tablet by mouth daily.   12/31/2016 at Unknown time  . HYDROcodone-acetaminophen (NORCO) 10-325 MG tablet Take 1-2 tablets by mouth every 4 (four) hours as needed for pain.   01/01/2017 at Unknown time  . lidocaine (XYLOCAINE) 2 % jelly Apply 1 application topically once.   Past Month at Unknown time  . Meth-Hyo-M Bl-Na Phos-Ph Sal (URIBEL) 118 MG CAPS Take 1 capsule by mouth every 6 (six) hours.   12/31/2016 at Unknown time  . polyethylene glycol (MIRALAX / GLYCOLAX) packet Take 17 g by mouth daily as needed.   Past Week at Unknown time  . SPIRIVA HANDIHALER 18 MCG inhalation capsule Place 1 capsule into inhaler and inhale daily.   12/31/2016 at Unknown time  . torsemide (DEMADEX) 100 MG tablet Take 0.5 tablets by mouth daily.   12/31/2016 at Unknown time  . Tripelennamine HCl POWD Take 25 mg by mouth 2 (two) times daily.    12/31/2016 at Unknown time  . vancomycin 500  mg in sodium chloride 0.9 % 100 mL Inject 500 mg into the vein daily. 10 Dose 0 12/31/2016 at Unknown time   Scheduled: . docusate sodium  100 mg Oral BID  . linezolid  600 mg Oral Q12H  . mirtazapine  7.5 mg Oral QHS  . [START ON 01/05/2017] pantoprazole  40 mg Oral BH-q7a  . potassium chloride  20 mEq Oral BID  . tiotropium  1 capsule Inhalation Daily  . URIBEL  1 capsule Oral Q6H   Continuous: . sodium chloride 75 mL/hr at 01/04/17 0200  . aztreonam Stopped (01/04/17 0630)  . diltiazem (CARDIZEM) infusion 5 mg/hr (01/04/17 0200)   KFE:[id number] **OR** acetaminophen, ALPRAZolam, HYDROcodone-acetaminophen, ondansetron **OR** ondansetron (ZOFRAN) IV  Assesment: He was admitted with sepsis. He has enterococcus in his urine. Although it is listed as being sensitive to vancomycin he was on IV vancomycin at home and got worse so I don't think that's  going to be a good choice. He has acute metabolic encephalopathy related to his sepsis and that is still giving him trouble. He had significant anemia and his hemoglobin level is holding now. He has history of congestive heart failure so we have to be somewhat careful with IV fluid administration. He has atrial fibrillation rapid ventricular response on diltiazem. His potassium was a little bit low yesterday and is better today. Renal function is holding. Active Problems:   Sepsis ([place])   Pressure injury of skin   Sepsis due to undetermined organism ([place])   Acute metabolic encephalopathy   Atrial fibrillation with RVR ([place])    Plan: Continue treatments. H he has significant nutritional issues as well. Add Remeron to see if it will increase his appetite. Continue other treatments. Discussed with wife at bedside    LOS: 3 days   [place] Toor L 01/04/2017, 8:55 AM

## 2017-01-04 NOTE — Progress Notes (Signed)
Pt mouth breathing while sleeping. SpO2 dropping into the 80's. [place] changed to HFNC at 4L. SpO2 currently 98%. RT will titrate O2 as tolerated.

## 2017-01-04 NOTE — Progress Notes (Addendum)
Pharmacy Antibiotic Note  Erik Atkins is a 76 y.o. male admitted on 01/01/2017 with sepsis.  Pharmacy has been consulted for AZTREONAM dosing.  Plan: Continue Aztreonam 1gm IV q8hrs Continue Zyvox 600mg  PO bid per MD Monitor labs, progress, c/s  Height: 5\' 5"  (165.1 cm) Weight: 116 lb 13.5 oz (53 kg) IBW/kg (Calculated) : 61.5  Temp (24hrs), Avg:97.8 F (36.6 C), Min:97.5 F (36.4 C), Max:98.3 F (36.8 C)   Recent Labs Lab 12/29/16 0606 01/01/17 0849 01/01/17 1110 01/01/17 1151 01/01/17 1403 01/02/17 0612 01/03/17 0415 01/04/17 0442  WBC 15.4* 32.2*  --   --   --  24.3* 18.2* 26.3*  CREATININE 1.41* 2.37*  --   --   --  1.44* 1.11 1.02  LATICACIDVEN  --   --  1.66 1.97*  --   --   --   --   VANCORANDOM  --   --   --   --  16  --   --   --     Estimated Creatinine Clearance: 45.5 mL/min (by C-G formula based on SCr of 1.02 mg/dL).    Allergies  Allergen Reactions  . Penicillins     Has patient had a PCN reaction causing immediate rash, facial/tongue/throat swelling, SOB or lightheadedness with hypotension: Yes Has patient had a PCN reaction causing severe rash involving mucus membranes or skin necrosis: No Has patient had a PCN reaction that required hospitalization: No Has patient had a PCN reaction occurring within the last 10 years: No If all of the above answers are "NO", then may proceed with Cephalosporin use.   . [place]von Swelling  . Celexa [Citalopram Hydrobromide] Nausea And Vomiting  . Cortisone Swelling  . Cymbalta [Duloxetine Hcl] Nausea Only  . Eggs Or Egg-Derived Products Nausea And Vomiting  . Guaifenesin Er   . Keflex [Cephalexin] Swelling  . Motrin [Ibuprofen]   . Pumpkin Seed Nausea And Vomiting    All pumpkin products  . Sweet Potato Nausea And Vomiting       . Tea Nausea And Vomiting  . Zoloft [Sertraline] Nausea Only   Antimicrobials this admission: Aztreonam 8/8 >>  Linezolid  8/8 >> Vancomycin 8/8 x 1  Dose adjustments this  admission:  Microbiology results:  BCx: pending  UCx: E. Faecalis SENS to Amp and Vanc (on Zyvox due to PCN allergy & did not improve on Vanc)  MRSA PCR: negative  Thank you for allowing pharmacy to be a part of this patient's care.  [name] A 01/04/2017 8:35 AM

## 2017-01-05 ENCOUNTER — Inpatient Hospital Stay (HOSPITAL_COMMUNITY): Payer: Medicare Other

## 2017-01-05 DIAGNOSIS — I4821 Permanent atrial fibrillation: Secondary | ICD-10-CM | POA: Diagnosis present

## 2017-01-05 DIAGNOSIS — D638 Anemia in other chronic diseases classified elsewhere: Secondary | ICD-10-CM | POA: Diagnosis present

## 2017-01-05 DIAGNOSIS — D62 Acute posthemorrhagic anemia: Secondary | ICD-10-CM | POA: Diagnosis present

## 2017-01-05 LAB — CBC WITH DIFFERENTIAL/PLATELET
BASOS ABS: 0 10*3/uL (ref 0.0–0.1)
BASOS PCT: 0 %
EOS ABS: 0.2 10*3/uL (ref 0.0–0.7)
EOS PCT: 1 %
HCT: 33.8 % — ABNORMAL LOW (ref 39.0–52.0)
Hemoglobin: 11 g/dL — ABNORMAL LOW (ref 13.0–17.0)
Lymphocytes Relative: 8 %
Lymphs Abs: 2.2 10*3/uL (ref 0.7–4.0)
MCH: 32 pg (ref 26.0–34.0)
MCHC: 32.5 g/dL (ref 30.0–36.0)
MCV: 98.3 fL (ref 78.0–100.0)
MONO ABS: 1.9 10*3/uL — AB (ref 0.1–1.0)
MONOS PCT: 7 %
NEUTROS ABS: 24.8 10*3/uL — AB (ref 1.7–7.7)
Neutrophils Relative %: 85 %
PLATELETS: 280 10*3/uL (ref 150–400)
RBC: 3.44 MIL/uL — ABNORMAL LOW (ref 4.22–5.81)
RDW: 19.8 % — AB (ref 11.5–15.5)
WBC: 29.2 10*3/uL — ABNORMAL HIGH (ref 4.0–10.5)

## 2017-01-05 LAB — BASIC METABOLIC PANEL
ANION GAP: 6 (ref 5–15)
BUN: 20 mg/dL (ref 6–20)
CALCIUM: 7.9 mg/dL — AB (ref 8.9–10.3)
CO2: 19 mmol/L — AB (ref 22–32)
CREATININE: 0.96 mg/dL (ref 0.61–1.24)
Chloride: 122 mmol/L — ABNORMAL HIGH (ref 101–111)
Glucose, Bld: 125 mg/dL — ABNORMAL HIGH (ref 65–99)
Potassium: 3.9 mmol/L (ref 3.5–5.1)
SODIUM: 147 mmol/L — AB (ref 135–145)

## 2017-01-05 MED ORDER — LINEZOLID 600 MG/300ML IV SOLN
600.0000 mg | Freq: Two times a day (BID) | INTRAVENOUS | Status: DC
  Administered 2017-01-05 – 2017-01-06 (×2): 600 mg via INTRAVENOUS
  Filled 2017-01-05 (×4): qty 300

## 2017-01-05 MED ORDER — SODIUM CHLORIDE 0.9% FLUSH: 10.0000 mL | INTRAVENOUS | Status: DC | PRN

## 2017-01-05 MED ORDER — LEVALBUTEROL HCL 0.63 MG/3ML IN NEBU
0.6300 mg | INHALATION_SOLUTION | Freq: Four times a day (QID) | RESPIRATORY_TRACT | Status: DC | PRN
  Administered 2017-01-05 (×2): 0.63 mg via RESPIRATORY_TRACT
  Filled 2017-01-05: qty 3

## 2017-01-05 MED ORDER — LINEZOLID 600 MG/300ML IV SOLN
600.0000 mg | INTRAVENOUS | Status: AC
  Administered 2017-01-05: 600 mg via INTRAVENOUS
  Filled 2017-01-05: qty 300

## 2017-01-05 MED ORDER — SODIUM CHLORIDE 0.9% FLUSH
10.0000 mL | Freq: Two times a day (BID) | INTRAVENOUS | Status: DC
  Administered 2017-01-05 – 2017-01-07 (×5): 10 mL

## 2017-01-05 MED ORDER — CHLORHEXIDINE GLUCONATE CLOTH 2 % EX PADS
6.0000 | MEDICATED_PAD | Freq: Every day | CUTANEOUS | Status: DC
  Administered 2017-01-05 – 2017-01-06 (×2): 6 via TOPICAL

## 2017-01-05 MED ORDER — FUROSEMIDE 10 MG/ML IJ SOLN
40.0000 mg | Freq: Once | INTRAMUSCULAR | Status: AC
  Administered 2017-01-05: 40 mg via INTRAVENOUS
  Filled 2017-01-05: qty 4

## 2017-01-05 MED ORDER — MORPHINE SULFATE (PF) 2 MG/ML IV SOLN
2.0000 mg | INTRAVENOUS | Status: DC | PRN
  Administered 2017-01-05 – 2017-01-06 (×4): 2 mg via INTRAVENOUS
  Filled 2017-01-05 (×4): qty 1

## 2017-01-05 MED ORDER — LEVALBUTEROL HCL 0.63 MG/3ML IN NEBU
INHALATION_SOLUTION | RESPIRATORY_TRACT | Status: AC
  Administered 2017-01-05: 0.63 mg via RESPIRATORY_TRACT
  Filled 2017-01-05: qty 3

## 2017-01-05 MED ORDER — METRONIDAZOLE IN NACL 5-0.79 MG/ML-% IV SOLN
500.0000 mg | Freq: Three times a day (TID) | INTRAVENOUS | Status: DC
  Administered 2017-01-05 – 2017-01-06 (×4): 500 mg via INTRAVENOUS
  Filled 2017-01-05 (×4): qty 100

## 2017-01-05 NOTE — Progress Notes (Addendum)
Subjective: He is significantly worse this morning. He appears to be hallucinating. He is not talking. Although he looks around the room he doesn't seem to recognize anybody. He coughed up a large amount of very thick sputum this morning. His white count has gone up substantially. He looks more short of breath. His oxygenation has been marginal  Objective: Vital signs in last 24 hours: Temp:  [97.6 F (36.4 C)-98.7 F (37.1 C)] 97.6 F (36.4 C) (08/12 0801) Weight:  [53.8 kg (118 lb 9.7 oz)] 53.8 kg (118 lb 9.7 oz) (08/12 0500) Weight change:  Last BM Date: 01/04/17  Intake/Output from previous day: 08/11 0701 - 08/12 0700 In: 3252.6 [P.O.:620; I.V.:2382.6; IV Piggyback:250] Out: 951 [Urine:950; Stool:1]  PHYSICAL EXAM General appearance: He's awake, mouth breathing looks uncomfortable Resp: rhonchi bilaterally and wheezes bilaterally Cardio: irregularly irregular rhythm GI: soft, non-tender; bowel sounds normal; no masses,  no organomegaly Extremities: extremities normal, atraumatic, no cyanosis or edema Not talking. Looks like he is hallucinating.  Lab Results:  Results for orders placed or performed during the hospital encounter of 01/01/17 (from the past 48 hour(s))  CBC with Differential/Platelet     Status: Abnormal   Collection Time: 01/04/17  4:42 AM  Result Value Ref Range   WBC 26.3 (H) 4.0 - 10.5 K/uL    Comment: WHITE COUNT CONFIRMED ON SMEAR   RBC 3.53 (L) 4.22 - 5.81 MIL/uL   Hemoglobin 11.2 (L) 13.0 - 17.0 g/dL   HCT 33.7 (L) 39.0 - 52.0 %   MCV 95.5 78.0 - 100.0 fL   MCH 31.7 26.0 - 34.0 pg   MCHC 33.2 30.0 - 36.0 g/dL   RDW 19.2 (H) 11.5 - 15.5 %   Platelets 322 150 - 400 K/uL   Neutrophils Relative % 79 %   Neutro Abs 20.8 (H) 1.7 - 7.7 K/uL   Lymphocytes Relative 12 %   Lymphs Abs 3.1 0.7 - 4.0 K/uL   Monocytes Relative 7 %   Monocytes Absolute 1.8 (H) 0.1 - 1.0 K/uL   Eosinophils Relative 2 %   Eosinophils Absolute 0.6 0.0 - 0.7 K/uL   Basophils  Relative 0 %   Basophils Absolute 0.0 0.0 - 0.1 K/uL   WBC Morphology MILD LEFT SHIFT (1-5% METAS, OCC MYELO, OCC BANDS)     Comment: TOXIC GRANULATION  Basic metabolic panel     Status: Abnormal   Collection Time: 01/04/17  4:42 AM  Result Value Ref Range   Sodium 143 135 - 145 mmol/L   Potassium 3.5 3.5 - 5.1 mmol/L   Chloride 117 (H) 101 - 111 mmol/L   CO2 19 (L) 22 - 32 mmol/L   Glucose, Bld 128 (H) 65 - 99 mg/dL   BUN 21 (H) 6 - 20 mg/dL   Creatinine, Ser 1.02 0.61 - 1.24 mg/dL   Calcium 7.8 (L) 8.9 - 10.3 mg/dL   GFR calc non Af Amer >60 >60 mL/min   GFR calc Af Amer >60 >60 mL/min    Comment: (NOTE) The eGFR has been calculated using the CKD EPI equation. This calculation has not been validated in all clinical situations. eGFR's persistently <60 mL/min signify possible Chronic Kidney Disease.    Anion gap 7 5 - 15  Occult blood card to lab, stool RN will collect     Status: Abnormal   Collection Time: 01/04/17  2:17 PM  Result Value Ref Range   Fecal Occult Bld POSITIVE (A) NEGATIVE  CBC with Differential/Platelet  Status: Abnormal   Collection Time: 01/05/17  4:27 AM  Result Value Ref Range   WBC 29.2 (H) 4.0 - 10.5 K/uL    Comment: WHITE COUNT CONFIRMED ON SMEAR   RBC 3.44 (L) 4.22 - 5.81 MIL/uL   Hemoglobin 11.0 (L) 13.0 - 17.0 g/dL   HCT 64.3 (L) 14.2 - 76.7 %   MCV 98.3 78.0 - 100.0 fL   MCH 32.0 26.0 - 34.0 pg   MCHC 32.5 30.0 - 36.0 g/dL   RDW 01.1 (H) 00.3 - 49.6 %   Platelets 280 150 - 400 K/uL   Neutrophils Relative % 85 %   Neutro Abs 24.8 (H) 1.7 - 7.7 K/uL   Lymphocytes Relative 8 %   Lymphs Abs 2.2 0.7 - 4.0 K/uL   Monocytes Relative 7 %   Monocytes Absolute 1.9 (H) 0.1 - 1.0 K/uL   Eosinophils Relative 1 %   Eosinophils Absolute 0.2 0.0 - 0.7 K/uL   Basophils Relative 0 %   Basophils Absolute 0.0 0.0 - 0.1 K/uL  Basic metabolic panel     Status: Abnormal   Collection Time: 01/05/17  4:27 AM  Result Value Ref Range   Sodium 147 (H) 135 -  145 mmol/L   Potassium 3.9 3.5 - 5.1 mmol/L   Chloride 122 (H) 101 - 111 mmol/L   CO2 19 (L) 22 - 32 mmol/L   Glucose, Bld 125 (H) 65 - 99 mg/dL   BUN 20 6 - 20 mg/dL   Creatinine, Ser 1.16 0.61 - 1.24 mg/dL   Calcium 7.9 (L) 8.9 - 10.3 mg/dL   GFR calc non Af Amer >60 >60 mL/min   GFR calc Af Amer >60 >60 mL/min    Comment: (NOTE) The eGFR has been calculated using the CKD EPI equation. This calculation has not been validated in all clinical situations. eGFR's persistently <60 mL/min signify possible Chronic Kidney Disease.    Anion gap 6 5 - 15    ABGS No results for input(s): PHART, PO2ART, TCO2, HCO3 in the last 72 hours.  Invalid input(s): PCO2 CULTURES Recent Results (from the past 240 hour(s))  Urine culture     Status: Abnormal   Collection Time: 12/28/16 11:24 AM  Result Value Ref Range Status   Specimen Description URINE, CLEAN CATCH  Final   Special Requests Normal  Final   Culture >=100,000 COLONIES/mL ENTEROCOCCUS FAECALIS (A)  Final   Report Status 12/30/2016 FINAL  Final   Organism ID, Bacteria ENTEROCOCCUS FAECALIS (A)  Final      Susceptibility   Enterococcus faecalis - MIC*    AMPICILLIN <=2 SENSITIVE Sensitive     LEVOFLOXACIN >=8 RESISTANT Resistant     NITROFURANTOIN 128 RESISTANT Resistant     VANCOMYCIN 1 SENSITIVE Sensitive     * >=100,000 COLONIES/mL ENTEROCOCCUS FAECALIS  Culture, blood (Routine X 2) w Reflex to ID Panel     Status: None (Preliminary result)   Collection Time: 01/01/17  8:49 AM  Result Value Ref Range Status   Specimen Description BLOOD LEFT ARM  Final   Special Requests   Final    BOTTLES DRAWN AEROBIC AND ANAEROBIC Blood Culture results may not be optimal due to an excessive volume of blood received in culture bottles   Culture NO GROWTH 4 DAYS  Final   Report Status PENDING  Incomplete  Culture, blood (Routine X 2) w Reflex to ID Panel     Status: None (Preliminary result)   Collection Time: 01/01/17  8:57  AM  Result  Value Ref Range Status   Specimen Description BLOOD LEFT ARM  Final   Special Requests   Final    BOTTLES DRAWN AEROBIC AND ANAEROBIC Blood Culture adequate volume   Culture NO GROWTH 4 DAYS  Final   Report Status PENDING  Incomplete  MRSA PCR Screening     Status: None   Collection Time: 01/01/17  2:35 PM  Result Value Ref Range Status   MRSA by PCR NEGATIVE NEGATIVE Final    Comment:        The GeneXpert MRSA Assay (FDA approved for NASAL specimens only), is one component of a comprehensive MRSA colonization surveillance program. It is not intended to diagnose MRSA infection nor to guide or monitor treatment for MRSA infections.   Culture, Urine     Status: Abnormal   Collection Time: 01/01/17  2:50 PM  Result Value Ref Range Status   Specimen Description URINE, CLEAN CATCH  Final   Special Requests NONE  Final   Culture >=100,000 COLONIES/mL ENTEROCOCCUS FAECALIS (A)  Final   Report Status 01/04/2017 FINAL  Final   Organism ID, Bacteria ENTEROCOCCUS FAECALIS (A)  Final      Susceptibility   Enterococcus faecalis - MIC*    AMPICILLIN <=2 SENSITIVE Sensitive     LEVOFLOXACIN >=8 RESISTANT Resistant     NITROFURANTOIN 128 RESISTANT Resistant     VANCOMYCIN 1 SENSITIVE Sensitive     * >=100,000 COLONIES/mL ENTEROCOCCUS FAECALIS   Studies/Results: No results found.  Medications:  Prior to Admission:  Prescriptions Prior to Admission  Medication Sig Dispense Refill Last Dose  . ALPRAZolam (XANAX) 0.25 MG tablet Take 0.25 mg by mouth 5 (five) times daily as needed for anxiety.    12/31/2016 at Unknown time  . apixaban (ELIQUIS) 2.5 MG TABS tablet Take 1 tablet (2.5 mg total) by mouth 2 (two) times daily. 60 tablet  12/31/2016 at 2000  . CRANBERRY PO Take 1 tablet by mouth daily.   12/31/2016 at Unknown time  . diltiazem (CARDIZEM LA) 240 MG 24 hr tablet Take 1 tablet (240 mg total) by mouth daily.   12/31/2016 at Unknown time  . Docusate Calcium (STOOL SOFTENER PO) Take 1 tablet  by mouth 2 (two) times daily.   01/01/2017 at Unknown time  . escitalopram (LEXAPRO) 10 MG tablet Take 1 tablet by mouth daily.   12/31/2016 at Unknown time  . HYDROcodone-acetaminophen (NORCO) 10-325 MG tablet Take 1-2 tablets by mouth every 4 (four) hours as needed for pain.   01/01/2017 at Unknown time  . lidocaine (XYLOCAINE) 2 % jelly Apply 1 application topically once.   Past Month at Unknown time  . Meth-Hyo-M Bl-Na Phos-Ph Sal (URIBEL) 118 MG CAPS Take 1 capsule by mouth every 6 (six) hours.   12/31/2016 at Unknown time  . polyethylene glycol (MIRALAX / GLYCOLAX) packet Take 17 g by mouth daily as needed.   Past Week at Unknown time  . SPIRIVA HANDIHALER 18 MCG inhalation capsule Place 1 capsule into inhaler and inhale daily.   12/31/2016 at Unknown time  . torsemide (DEMADEX) 100 MG tablet Take 0.5 tablets by mouth daily.   12/31/2016 at Unknown time  . Tripelennamine HCl POWD Take 25 mg by mouth 2 (two) times daily.    12/31/2016 at Unknown time  . vancomycin 500 mg in sodium chloride 0.9 % 100 mL Inject 500 mg into the vein daily. 10 Dose 0 12/31/2016 at Unknown time   Scheduled: . docusate sodium  100 mg Oral BID  . linezolid  600 mg Oral Q12H  . mirtazapine  7.5 mg Oral QHS  . pantoprazole  40 mg Oral [id number]  . tiotropium  1 capsule Inhalation Daily  . URIBEL  1 capsule Oral Q6H   Continuous: . sodium chloride 75 mL/hr at 01/04/17 1900  . diltiazem (CARDIZEM) infusion 10 mg/hr (01/05/17 0417)  . metronidazole     NIO:[id number] **OR** acetaminophen, ALPRAZolam, HYDROcodone-acetaminophen, levalbuterol, ondansetron **OR** ondansetron (ZOFRAN) IV  Assesment: He was admitted with sepsis. He has urinary tract infection with enterococcus which is being treated. I'm concerned that he may have aspirated. His nurse reports he has trouble swallowing pills. He had improved but he is much worse today. He's not talking. He looks much more short of breath. He continues with atrial fib with RVR. Active  Problems:   Sepsis ([place])   Pressure injury of skin   Sepsis due to undetermined organism ([place])   Acute metabolic encephalopathy   Atrial fibrillation with RVR ([facility])    Plan: Discontinue aztreonam. Add Flagyl. . Chest x-ray today. Discussed with wife at bedside. She's going to call family in. She is considering comfort care. I have requested palliative medicine consultation    LOS: 4 days   Amire Leazer L 01/05/2017, 8:46 AM

## 2017-01-05 NOTE — Progress Notes (Signed)
Pt appeared uncomfortable with increased WOB and SOB. PRN tx given. Pt states the tx helped some. O2 increased to 7L HFNC due to SpO2 staying in mid 80's. Pt also coughed up about a quarter size amount of thick, yellow sputum. RT will continue to monitor.

## 2017-01-06 ENCOUNTER — Encounter (HOSPITAL_COMMUNITY): Payer: Self-pay | Admitting: Primary Care

## 2017-01-06 DIAGNOSIS — Z515 Encounter for palliative care: Secondary | ICD-10-CM

## 2017-01-06 DIAGNOSIS — Z7189 Other specified counseling: Secondary | ICD-10-CM

## 2017-01-06 LAB — COMPREHENSIVE METABOLIC PANEL
ALK PHOS: 41 U/L (ref 38–126)
ALT: 12 U/L — ABNORMAL LOW (ref 17–63)
ANION GAP: 7 (ref 5–15)
AST: 13 U/L — ABNORMAL LOW (ref 15–41)
Albumin: 2.3 g/dL — ABNORMAL LOW (ref 3.5–5.0)
BILIRUBIN TOTAL: 0.6 mg/dL (ref 0.3–1.2)
BUN: 15 mg/dL (ref 6–20)
CALCIUM: 7.7 mg/dL — AB (ref 8.9–10.3)
CO2: 19 mmol/L — ABNORMAL LOW (ref 22–32)
Chloride: 114 mmol/L — ABNORMAL HIGH (ref 101–111)
Creatinine, Ser: 0.86 mg/dL (ref 0.61–1.24)
GFR calc Af Amer: >60 mL/min (ref >60)
GLUCOSE: 313 mg/dL — AB (ref 65–99)
POTASSIUM: 3.4 mmol/L — AB (ref 3.5–5.1)
Sodium: 140 mmol/L (ref 135–145)
TOTAL PROTEIN: 5.2 g/dL — AB (ref 6.5–8.1)

## 2017-01-06 LAB — CBC WITH DIFFERENTIAL/PLATELET
Basophils Absolute: 0 10*3/uL (ref 0.0–0.1)
Basophils Relative: 0 %
Eosinophils Absolute: 0 10*3/uL (ref 0.0–0.7)
Eosinophils Relative: 0 %
HEMATOCRIT: 32 % — AB (ref 39.0–52.0)
HEMOGLOBIN: 10.5 g/dL — AB (ref 13.0–17.0)
LYMPHS ABS: 0.5 10*3/uL — AB (ref 0.7–4.0)
LYMPHS PCT: 2 %
MCH: 32.1 pg (ref 26.0–34.0)
MCHC: 32.8 g/dL (ref 30.0–36.0)
MCV: 97.9 fL (ref 78.0–100.0)
MONO ABS: 1.2 10*3/uL — AB (ref 0.1–1.0)
MONOS PCT: 4 %
NEUTROS ABS: 28.7 10*3/uL — AB (ref 1.7–7.7)
NEUTROS PCT: 94 %
Platelets: 220 10*3/uL (ref 150–400)
RBC: 3.27 MIL/uL — ABNORMAL LOW (ref 4.22–5.81)
RDW: 19.9 % — AB (ref 11.5–15.5)
WBC: 30.4 10*3/uL — ABNORMAL HIGH (ref 4.0–10.5)

## 2017-01-06 LAB — CULTURE, BLOOD (ROUTINE X 2)
CULTURE: NO GROWTH
Culture: NO GROWTH
SPECIAL REQUESTS: ADEQUATE

## 2017-01-06 MED ORDER — PANTOPRAZOLE SODIUM 40 MG IV SOLR
40.0000 mg | INTRAVENOUS | Status: DC
  Administered 2017-01-06: 40 mg via INTRAVENOUS
  Filled 2017-01-06: qty 40

## 2017-01-06 MED ORDER — BISACODYL 10 MG RE SUPP: 10.0000 mg | Freq: Every day | RECTAL | Status: DC | PRN

## 2017-01-06 NOTE — Consult Note (Signed)
Consultation Note Date: 01/06/2017   Patient Name: Erik Atkins  DOB: June 02, 1940  MRN: [id number]  Age / Sex: 76 y.o., male  PCP: Sinda Du, MD Referring Physician: Sinda Du, MD  Reason for Consultation: Establishing goals of care, Inpatient hospice referral and Psychosocial/spiritual support  HPI/Patient Profile: 76 y.o. male  with past medical history of COPD, Charcot Lelan Pons tooth muscular atrophy, hypertension, a fee of, neurogenic bladder with chronic indwelling Foley catheter history of back surgery 5 years ago, history of pneumonia admitted on 01/01/2017 with sepsis presumably secondary to UTI.   Clinical Assessment and Goals of Care: Mr. Bierlein, "Ronalee Belts" is lying quietly in bed. Nursing staff is at bedside. He looks frail and acutely ill. He is able to open his eyes when I ask, but only briefly makes eye contact. Wife Bethena Roys is at bedside, we go to my office for meeting.  Bethena Roys and Ronalee Belts have been married for 42 years in September. They have no children, and she states neither one half siblings. I ask about her support system, she shares that she does have friends who can support her emotionally. 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 She tells the story of functional decline that started approximately 5 years ago, but has worsened over the last 2 to 2 1/2 years. She states that "it's lucky he is content to just watch TV". She states over the last few weeks to months he has had even further functional decline, unable to even toilet himself. We talk about intake, Bethena Roys states that he won't eat, and has started to cough after eating and drinking at home.  Bethena Roys states that she has seen even more changes since Saturday. She states that he is now seeing people in his room (no one that he recognizes). We talk about what is normal and expected at end-of-life  including, sleeping more, eating and interacting less. Bethena Roys endorses all of these.  We talk about the treatment plan including 2 IV antibiotics, IV fluids, Cardizem IV.  We talk about doing something "to him or for him". I share my concern that time may be short for Ronalee Belts. Bethena Roys agrees. With permission we talk about prognosis. I share that the medical team thinks it's appropriate to focus on comfort and dignity. Bethena Roys shares some of her feelings of guilt, and I reassure her. At this point, Bethena Roys is ready to transition to comfort and dignity for Mr. Mccaskill. She is requesting transition to hospice home of [facility] 8/14.  Call to Dr. Luan Pulling for update.  Healthcare power of attorney NEXT OF KIN - wife of 17 years, Bethena Roys. No children, no siblings.   SUMMARY OF RECOMMENDATIONS   wife Bethena Roys is requesting transition to comfort care. We discuss transition to hospice home of [facility] 8/14.  Code Status/Advance Care Planning:  DNR  Symptom Management:   per Dr. Luan Pulling, no additional needs at this time.  Palliative Prophylaxis:   Aspiration, Frequent Pain Assessment and Turn Reposition  Additional Recommendations (Limitations, Scope, Preferences):  Full Comfort Care  Psycho-social/Spiritual:   Desire for further Chaplaincy support:no  Additional Recommendations: Caregiving  Support/Resources and Education on Hospice  Prognosis:   < 2 weeks, likely days related to superinfection and wife's desire to focus on comfort and dignity, let nature take its course.  Discharge Planning: Wife is requesting transition to hosp[facility] home [facility].      Primary Diagnoses: Present on Admission: . Sepsis ([place]) . Sepsis due to undetermined organism ([place]) . Pressure injury of skin . Anemia of chronic disease . Acute blood loss anemia . Permanent atrial fibrillation ([place])   I have reviewed the medical record, interviewed the patient and family, and examined the  patient. The following aspects are pertinent.  Past Medical History:  Diagnosis Date  . Anxiety   . Charcot Marie Tooth muscular atrophy   . COPD (chronic obstructive pulmonary disease) ([place])   . Hemorrhoids   . History of pneumonia   . Hypertension   . Nerve disorder 1996   "Charot Marie"  . Neuropathy   . Tubular adenoma    Social History   Social History  . Marital status: Married    Spouse name: N/A  . Number of children: N/A  . Years of education: N/A   Social History Main Topics  . Smoking status: Former Smoker    Packs/day: 0.50    Years: 50.00    Types: Cigarettes  . Smokeless tobacco: Never Used     Comment: uses e cigs  . Alcohol use No  . Drug use: No  . Sexual activity: No   Other Topics Concern  . None   Social History Narrative  . None   History reviewed. No pertinent family history. Scheduled Meds: . Chlorhexidine Gluconate Cloth  6 each Topical Daily  . docusate sodium  100 mg Oral BID  . mirtazapine  7.5 mg Oral QHS  . pantoprazole (PROTONIX) IV  40 mg Intravenous Q24H  . sodium chloride flush  10-40 mL Intracatheter Q12H  . tiotropium  1 capsule Inhalation Daily   Continuous Infusions: . sodium chloride 75 mL/hr at 01/06/17 1000  . diltiazem (CARDIZEM) infusion 15 mg/hr (01/06/17 0815)  . linezolid (ZYVOX) IV Stopped (01/06/17 1049)  . metronidazole Stopped (01/06/17 0918)   PRN Meds:.acetaminophen **OR** acetaminophen, ALPRAZolam, HYDROcodone-acetaminophen, levalbuterol, morphine injection, ondansetron **OR** ondansetron (ZOFRAN) IV, sodium chloride flush Medications Prior to Admission:  Prior to Admission medications   Medication Sig Start Date End Date Taking? Authorizing Provider  ALPRAZolam (XANAX) 0.25 MG tablet Take 0.25 mg by mouth 5 (five) times daily as needed for anxiety.    Yes [provider]  apixaban (ELIQUIS) 2.5 MG TABS tablet Take 1 tablet (2.5 mg total) by mouth 2 (two) times daily. 07/06/16  Yes Sinda Du,  MD  CRANBERRY PO Take 1 tablet by mouth daily.   Yes [provider]  diltiazem (CARDIZEM LA) 240 MG 24 hr tablet Take 1 tablet (240 mg total) by mouth daily. 07/06/16  Yes Sinda Du, MD  Docusate Calcium (STOOL SOFTENER PO) Take 1 tablet by mouth 2 (two) times daily.   Yes [provider]  escitalopram (LEXAPRO) 10 MG tablet Take 1 tablet by mouth daily. 12/02/13  Yes [provider]  HYDROcodone-acetaminophen (NORCO) 10-325 MG tablet Take 1-2 tablets by mouth every 4 (four) hours as needed for pain. 10/15/16  Yes [provider]  lidocaine (XYLOCAINE) 2 % jelly Apply 1 application topically once. 10/01/16  Yes [provider]  Meth-Hyo-M Bl-Na Phos-Ph Sal (URIBEL) 118 MG CAPS Take 1 capsule by mouth every 6 (six) hours.   Yes [provider]  polyethylene glycol (MIRALAX / GLYCOLAX) packet Take 17 g by mouth daily as needed.   Yes [provider]  SPIRIVA HANDIHALER 18 MCG inhalation capsule Place 1 capsule into inhaler and inhale daily. 12/11/11  Yes [provider]  torsemide (DEMADEX) 100 MG tablet Take 0.5 tablets by mouth daily. 11/22/13  Yes [provider]  Tripelennamine HCl POWD Take 25 mg by mouth 2 (two) times daily.    Yes [provider]  vancomycin 500 mg in sodium chloride 0.9 % 100 mL Inject 500 mg into the vein daily. 12/30/16  Yes Sinda Du, MD   Allergies  Allergen Reactions  . Penicillins     Has patient had a PCN reaction causing immediate rash, facial/tongue/throat swelling, SOB or lightheadedness with hypotension: Yes Has patient had a PCN reaction causing severe rash involving mucus membranes or skin necrosis: No Has patient had a PCN reaction that required hospitalization: No Has patient had a PCN reaction occurring within the last 10 years: No If all of the above answers are "NO", then may proceed with Cephalosporin use.   . Darvon Swelling  . Celexa [Citalopram  Hydrobromide] Nausea And Vomiting  . Cortisone Swelling  . Cymbalta [Duloxetine Hcl] Nausea Only  . Eggs Or Egg-Derived Products Nausea And Vomiting  . Guaifenesin Er   . Keflex [Cephalexin] Swelling  . Motrin [Ibuprofen]   . Pumpkin Seed Nausea And Vomiting    All pumpkin products  . Sweet Potato Nausea And Vomiting       . Tea Nausea And Vomiting  . Zoloft [Sertraline] Nausea Only   Review of Systems  Unable to perform ROS: Acuity of condition    Physical Exam  Constitutional: No distress.  Appears weak and frail, chronically/acutely ill  HENT:  Head: Atraumatic.  Cardiovascular: Normal rate.   Irregular heart rate, at times 120's  Pulmonary/Chest: Effort normal.  Abdominal: Soft. He exhibits no distension.  Musculoskeletal: He exhibits no edema.  Neurological:  Opens eyes when asked, Unable to make eye contact  Skin: Skin is warm and dry.  Mottleing of bilateral knees  Nursing note and vitals reviewed.   Vital Signs: BP (!) 115/51   Pulse 70   Temp (!) 95.5 F (35.3 C) (Rectal) Comment: nurse aware  Resp (!) 23   Ht 5\' 5"  (1.651 m)   Wt 53 kg (116 lb 12.1 oz)   SpO2 98%   BMI 19.43 kg/m  Pain Assessment: CPOT POSS *See Group Information*: 1-Acceptable,Awake and alert Pain Score: Asleep   SpO2: SpO2: 98 % O2 Device:SpO2: 98 % O2 Flow Rate: .O2 Flow Rate (L/min): 8 L/min  IO: Intake/output summary:  Intake/Output Summary (Last 24 hours) at 01/06/17 1339 Last data filed at 01/06/17 0902  Gross per 24 hour  Intake              980 ml  Output             1250 ml  Net             -270 ml    LBM: Last BM Date: 01/06/17 Baseline Weight: Weight: 57.2 kg (126 lb) Most recent weight: Weight: 53 kg (116 lb 12.1 oz)     Palliative Assessment/Data:   Flowsheet Rows     Most Recent Value  Intake Tab  Referral Department  Hospitalist  Unit at Time of Referral  ICU  Palliative Care Primary Diagnosis  Sepsis/Infectious Disease  Date Notified  01/05/17    Palliative Care Type  New Palliative care  Reason for referral  Clarify Goals of Care, End of [place], Counsel Regarding Hospice  Date of Admission  01/01/17  Date first seen by Palliative Care  01/06/17  # of days Palliative referral response time  1 Day(s)  # of days IP prior to Palliative referral  4  Clinical Assessment  Palliative Performance Scale Score  20%  Pain Max last 24 hours  Not able to report  Pain Min Last 24 hours  Not able to report  Dyspnea Max Last 24 Hours  Not able to report  Dyspnea Min Last 24 hours  Not able to report  Psychosocial & Spiritual Assessment  Palliative Care Outcomes  Patient/Family meeting held?  Yes  Who was at the meeting?  With wife in PMT office  [place] regarding hospice, Changed to focus on comfort, Provided end of life care assistance, Clarified goals of care  Patient/Family wishes: Interventions discontinued/not started   Mechanical Ventilation      Time In: 1100 Time Out: 1220 Time Total: 70 minutes Greater than 50%  of this time was spent counseling and coordinating care related to the above assessment and plan.  Signed by: Drue Novel, NP   Please contact Palliative Medicine Team phone at [phone] for questions and concerns.  For individual provider: See Shea Evans

## 2017-01-06 NOTE — Progress Notes (Signed)
Temperature rechecked just prior to placing the warming blanket and found to be 97 rectally.  Warming blanket on hold at this time.

## 2017-01-06 NOTE — Clinical Social Work Note (Signed)
Per palliative care nurse, patient's family want's patient at [facility].   LCSW to send referral to [facility].     Ariatna Jester, Clydene Pugh, LCSW

## 2017-01-06 NOTE — Progress Notes (Signed)
Dr. Luan Pulling notified of rectal temp 95.5.   WBCs 30.4.  Morning labs were reviewed by Dr. Luan Pulling.

## 2017-01-06 NOTE — Progress Notes (Signed)
Subjective: He's having significantly more trouble in general. His chest x-ray done yesterday that I personally reviewed shows what may be heart failure but also could be from aspiration. He's having a lot of trouble swallowing pills. He still looks around but does not respond.  Objective: Vital signs in last 24 hours: Temp:  [97.5 F (36.4 C)-97.8 F (36.6 C)] 97.7 F (36.5 C) (08/13 0400) Pulse Rate:  [82-124] 108 (08/13 0600) Resp:  [21-28] 21 (08/13 0600) BP: (102-132)/(59-90) 117/59 (08/13 0600) SpO2:  [87 %-100 %] 97 % (08/13 0600) Weight:  [53 kg (116 lb 12.1 oz)] 53 kg (116 lb 12.1 oz) (08/13 0500) Weight change: -0.84 kg (-1 lb 13.6 oz) Last BM Date: 01/04/17  Intake/Output from previous day: 08/12 0701 - 08/13 0700 In: 1040 [P.O.:140; I.V.:175; IV Piggyback:300] Out: 1250 [Urine:1250]  PHYSICAL EXAM General appearance: Alert, confused, not speaking Resp: rales bilaterally and rhonchi bilaterally Cardio: irregularly irregular rhythm GI: soft, non-tender; bowel sounds normal; no masses,  no organomegaly Extremities: extremities normal, atraumatic, no cyanosis or edema Skin turgor poor  Lab Results:  Results for orders placed or performed during the hospital encounter of 01/01/17 (from the past 48 hour(s))  Occult blood card to lab, stool RN will collect     Status: Abnormal   Collection Time: 01/04/17  2:17 PM  Result Value Ref Range   Fecal Occult Bld POSITIVE (A) NEGATIVE  CBC with Differential/Platelet     Status: Abnormal   Collection Time: 01/05/17  4:27 AM  Result Value Ref Range   WBC 29.2 (H) 4.0 - 10.5 K/uL    Comment: WHITE COUNT CONFIRMED ON SMEAR   RBC 3.44 (L) 4.22 - 5.81 MIL/uL   Hemoglobin 11.0 (L) 13.0 - 17.0 g/dL   HCT 33.8 (L) 39.0 - 52.0 %   MCV 98.3 78.0 - 100.0 fL   MCH 32.0 26.0 - 34.0 pg   MCHC 32.5 30.0 - 36.0 g/dL   RDW 19.8 (H) 11.5 - 15.5 %   Platelets 280 150 - 400 K/uL   Neutrophils Relative % 85 %   Neutro Abs 24.8 (H) 1.7 - 7.7  K/uL   Lymphocytes Relative 8 %   Lymphs Abs 2.2 0.7 - 4.0 K/uL   Monocytes Relative 7 %   Monocytes Absolute 1.9 (H) 0.1 - 1.0 K/uL   Eosinophils Relative 1 %   Eosinophils Absolute 0.2 0.0 - 0.7 K/uL   Basophils Relative 0 %   Basophils Absolute 0.0 0.0 - 0.1 K/uL  Basic metabolic panel     Status: Abnormal   Collection Time: 01/05/17  4:27 AM  Result Value Ref Range   Sodium 147 (H) 135 - 145 mmol/L   Potassium 3.9 3.5 - 5.1 mmol/L   Chloride 122 (H) 101 - 111 mmol/L   CO2 19 (L) 22 - 32 mmol/L   Glucose, Bld 125 (H) 65 - 99 mg/dL   BUN 20 6 - 20 mg/dL   Creatinine, Ser 0.96 0.61 - 1.24 mg/dL   Calcium 7.9 (L) 8.9 - 10.3 mg/dL   GFR calc non Af Amer >60 >60 mL/min   GFR calc Af Amer >60 >60 mL/min    Comment: (NOTE) The eGFR has been calculated using the CKD EPI equation. This calculation has not been validated in all clinical situations. eGFR's persistently <60 mL/min signify possible Chronic Kidney Disease.    Anion gap 6 5 - 15    ABGS No results for input(s): PHART, PO2ART, TCO2, HCO3 in the last  72 hours.  Invalid input(s): PCO2 CULTURES Recent Results (from the past 240 hour(s))  Urine culture     Status: Abnormal   Collection Time: 12/28/16 11:24 AM  Result Value Ref Range Status   Specimen Description URINE, CLEAN CATCH  Final   Special Requests Normal  Final   Culture >=100,000 COLONIES/mL ENTEROCOCCUS FAECALIS (A)  Final   Report Status 12/30/2016 FINAL  Final   Organism ID, Bacteria ENTEROCOCCUS FAECALIS (A)  Final      Susceptibility   Enterococcus faecalis - MIC*    AMPICILLIN <=2 SENSITIVE Sensitive     LEVOFLOXACIN >=8 RESISTANT Resistant     NITROFURANTOIN 128 RESISTANT Resistant     VANCOMYCIN 1 SENSITIVE Sensitive     * >=100,000 COLONIES/mL ENTEROCOCCUS FAECALIS  Culture, blood (Routine X 2) w Reflex to ID Panel     Status: None   Collection Time: 01/01/17  8:49 AM  Result Value Ref Range Status   Specimen Description BLOOD LEFT ARM  Final    Special Requests   Final    BOTTLES DRAWN AEROBIC AND ANAEROBIC Blood Culture results may not be optimal due to an excessive volume of blood received in culture bottles   Culture NO GROWTH 5 DAYS  Final   Report Status 01/06/2017 FINAL  Final  Culture, blood (Routine X 2) w Reflex to ID Panel     Status: None   Collection Time: 01/01/17  8:57 AM  Result Value Ref Range Status   Specimen Description BLOOD LEFT ARM  Final   Special Requests   Final    BOTTLES DRAWN AEROBIC AND ANAEROBIC Blood Culture adequate volume   Culture NO GROWTH 5 DAYS  Final   Report Status 01/06/2017 FINAL  Final  MRSA PCR Screening     Status: None   Collection Time: 01/01/17  2:35 PM  Result Value Ref Range Status   MRSA by PCR NEGATIVE NEGATIVE Final    Comment:        The GeneXpert MRSA Assay (FDA approved for NASAL specimens only), is one component of a comprehensive MRSA colonization surveillance program. It is not intended to diagnose MRSA infection nor to guide or monitor treatment for MRSA infections.   Culture, Urine     Status: Abnormal   Collection Time: 01/01/17  2:50 PM  Result Value Ref Range Status   Specimen Description URINE, CLEAN CATCH  Final   Special Requests NONE  Final   Culture >=100,000 COLONIES/mL ENTEROCOCCUS FAECALIS (A)  Final   Report Status 01/04/2017 FINAL  Final   Organism ID, Bacteria ENTEROCOCCUS FAECALIS (A)  Final      Susceptibility   Enterococcus faecalis - MIC*    AMPICILLIN <=2 SENSITIVE Sensitive     LEVOFLOXACIN >=8 RESISTANT Resistant     NITROFURANTOIN 128 RESISTANT Resistant     VANCOMYCIN 1 SENSITIVE Sensitive     * >=100,000 COLONIES/mL ENTEROCOCCUS FAECALIS   Studies/Results: Dg Chest Port 1 View  Result Date: 01/05/2017 CLINICAL DATA:  Respiratory failure EXAM: PORTABLE CHEST 1 VIEW COMPARISON:  01/01/2017 FINDINGS: Cardiac shadow remains enlarged. Right-sided PICC line is again seen. Increasing bilateral pleural effusions as well as vascular  congestion and edema is seen no bony abnormality is noted. IMPRESSION: Changes consistent with CHF with edema and bilateral pleural effusions. Electronically Signed   By: Inez Catalina M.D.   On: 01/05/2017 09:44    Medications:  Prior to Admission:  Prescriptions Prior to Admission  Medication Sig Dispense Refill Last Dose  .  ALPRAZolam (XANAX) 0.25 MG tablet Take 0.25 mg by mouth 5 (five) times daily as needed for anxiety.    12/31/2016 at Unknown time  . apixaban (ELIQUIS) 2.5 MG TABS tablet Take 1 tablet (2.5 mg total) by mouth 2 (two) times daily. 60 tablet  12/31/2016 at 2000  . CRANBERRY PO Take 1 tablet by mouth daily.   12/31/2016 at Unknown time  . diltiazem (CARDIZEM LA) 240 MG 24 hr tablet Take 1 tablet (240 mg total) by mouth daily.   12/31/2016 at Unknown time  . Docusate Calcium (STOOL SOFTENER PO) Take 1 tablet by mouth 2 (two) times daily.   01/01/2017 at Unknown time  . escitalopram (LEXAPRO) 10 MG tablet Take 1 tablet by mouth daily.   12/31/2016 at Unknown time  . HYDROcodone-acetaminophen (NORCO) 10-325 MG tablet Take 1-2 tablets by mouth every 4 (four) hours as needed for pain.   01/01/2017 at Unknown time  . lidocaine (XYLOCAINE) 2 % jelly Apply 1 application topically once.   Past Month at Unknown time  . Meth-Hyo-M Bl-Na Phos-Ph Sal (URIBEL) 118 MG CAPS Take 1 capsule by mouth every 6 (six) hours.   12/31/2016 at Unknown time  . polyethylene glycol (MIRALAX / GLYCOLAX) packet Take 17 g by mouth daily as needed.   Past Week at Unknown time  . SPIRIVA HANDIHALER 18 MCG inhalation capsule Place 1 capsule into inhaler and inhale daily.   12/31/2016 at Unknown time  . torsemide (DEMADEX) 100 MG tablet Take 0.5 tablets by mouth daily.   12/31/2016 at Unknown time  . Tripelennamine HCl POWD Take 25 mg by mouth 2 (two) times daily.    12/31/2016 at Unknown time  . vancomycin 500 mg in sodium chloride 0.9 % 100 mL Inject 500 mg into the vein daily. 10 Dose 0 12/31/2016 at Unknown time   Scheduled: .  Chlorhexidine Gluconate Cloth  6 each Topical Daily  . docusate sodium  100 mg Oral BID  . mirtazapine  7.5 mg Oral QHS  . pantoprazole  40 mg Oral [id number]  . sodium chloride flush  10-40 mL Intracatheter Q12H  . tiotropium  1 capsule Inhalation Daily  . URIBEL  1 capsule Oral Q6H   Continuous: . sodium chloride 1 mL (01/05/17 0851)  . diltiazem (CARDIZEM) infusion 15 mg/hr (01/06/17 0600)  . linezolid (ZYVOX) IV Stopped (01/05/17 2228)  . metronidazole Stopped (01/06/17 0110)   TDD:[id number] **OR** acetaminophen, ALPRAZolam, HYDROcodone-acetaminophen, levalbuterol, morphine injection, ondansetron **OR** ondansetron (ZOFRAN) IV, sodium chloride flush  Assesment: He has been admitted with sepsis. He is on appropriate treatment for that. I think he's aspirated and he's on Flagyl for that. Chest x-ray yesterday was suggested that he might have some heart failure and he received some IV Lasix but in general he looks more dry. I think the x-ray findings may have been more from aspiration. However he could have heart failure he's had problems with that in the past his nutrition is poor. He has acute metabolic encephalopathy which had improved but now is giving him more trouble. He has chronic atrial fib and he is on anticoagulation. He had bleeding from his urine in addition to anemia of chronic disease. 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 He has severe malnutrition. He is critically I think probably terminally ill. Active Problems:   Sepsis ([place])   Pressure injury of skin   Sepsis due to undetermined organism ([place])   Acute metabolic encephalopathy   Atrial fibrillation with RVR ([place])  Anemia of chronic disease   Acute blood loss anemia   Permanent atrial fibrillation ([place])    Plan: Continue treatments. Palliative care consultation today discussed with his wife who understands the gravity of his situation    LOS: 5 days   Lether Tesch L 01/06/2017, 7:45  AM

## 2017-01-06 NOTE — Clinical Social Work Note (Signed)
Delynn, Pursley ([id number]) Sex Male DOB 1940/08/31 SSN [id number]   Room Bed  IC12 IC12-01  Patient Demographics   Address [address] [place] [address] Phone [phone] (Home)  Patient Ethnicity & Race   Ethnic Group Patient Race  Not Hispanic or Latino White or Caucasian  Emergency Contact(s)   Name Relation Home Work Mobile  [place] Spouse [phone]  [phone]  Documents on File    Status Date Received Description  Documents for the Patient  EMR Patient Summary Not Received    [place] Not Received    [place] E-Signature HIPAA Notice of Privacy Received 08/09/10   [place] E-Signature HIPAA Notice of Privacy Spanish Not Received    Driver's License Received 54/27/06   Insurance Card Received 08/09/10   Advance Directives/Living Will/HCPOA/POA Not Received    Insurance Card Not Received    Historic Radiology Documentation Not Received    Historic Radiology Documentation Not Received    Financial Application Not Received    Insurance Card Not Received    Driver's License Not Received    HIM ROI Authorization Not Received    Release of Information Not Received    Insurance Card Received 06/22/12   Insurance Card Not Received    AMB New Patient Records/Historical Not Received  04/07-02/13  Insurance Card Received 06/14/14   Other Photo ID Not Received    Release of Information Received 09/15/15 [facility]  Insurance Card Received 10/13/15 [facility] MEDICARE  HIM ROI Authorization  10/24/15 [employer] Medicare Risk Adjustment Review  AMB Intake Forms/Questionnaires  10/25/15   Insurance Card     [employer] [facility] E-Signature HIPAA Notice of Privacy Signed 06/24/16   HIM ROI Authorization (Expired) 11/28/16 Authorization for batch [employer] Medicare Risk Adjustment Review    fbg   11/28/16  Documents for the Encounter  AOB (Assignment of Insurance Benefits) Not Received    E-signature AOB  Signed 01/01/17   MEDICARE RIGHTS Not Received    E-signature Medicare Rights Signed 01/01/17   Cardiac Monitoring Strip Shift Summary  01/01/17   Cardiac Monitoring Strip  01/02/17   ED Patient Billing Extract   ED PB Summary  EKG  01/02/17   Admission Information   Attending Provider Admitting Provider Admission Type Admission Date/Time  Sinda Du, MD Orson Eva, MD Emergency 01/01/17 [phone]  Discharge Date Hospital Service Auth/Cert Status Service Area   Internal Medicine Incomplete [place]  Unit Room/Bed Admission Status   AP-ICCUP NURSING IC12/IC12-01 Admission (Confirmed)   Admission   Complaint  vascular problem  Hospital Account   Name Acct ID Class Status Primary Coverage  Kiing, Deakin [id number] Inpatient [place]      Guarantor Account (for [place] [id number])   Name Relation to Pt Service Area Active? Acct Type  [place] Sou Self CHSA Yes Personal/Family  Address Phone    [address] [place], [place] [address] [phone])        Coverage Information (for Hospital Account [id number])   F/O Payor/Plan Precert #  [facility] [place] Subscriber #  [place], [place] [id number]  Address [place] [address] [address] [phone]

## 2017-01-07 MED ORDER — MORPHINE SULFATE (CONCENTRATE) 10 MG/0.5ML PO SOLN: 2.0000 mg | ORAL | 0 refills | Status: AC | PRN

## 2017-01-07 MED ORDER — MORPHINE SULFATE (CONCENTRATE) 10 MG/0.5ML PO SOLN
2.0000 mg | ORAL | Status: DC | PRN
  Administered 2017-01-07: 2 mg via SUBLINGUAL
  Filled 2017-01-07: qty 0.5

## 2017-01-07 NOTE — Progress Notes (Signed)
Report called to Sigmund Hazel at [facility].

## 2017-01-07 NOTE — Progress Notes (Signed)
Daily Progress Note   Patient Name: Erik Atkins       Date: 12/25/2016 DOB: March 04, 1941  Age: 76 y.o. MRN#: [id number] Attending Physician: Sinda Du, MD Primary Care Physician: Sinda Du, MD Admit Date: 01/01/2017  Reason for Consultation/Follow-up: Establishing goals of care, Inpatient hospice referral and Psychosocial/spiritual support  Subjective: Erik Atkins is lying quietly in bed. He does not try to communicate even his most basic needs at this time. He looks relatively comfortable. Wife, Bethena Roys is present at bedside today. We talk about transfer to the hospice home of [facility] sometime after noon today. We discuss use of by mouth morphine for respiratory rate. I share that our goal respiratory rate for Erik Atkins should be around 16 breaths per minute.   We talk about hospice home. Erik Atkins shares that they have discussed cost with her, we talk about financial matters related to hospice home. Conference with nursing related to care.  Length of Stay: 6  Current Medications: Scheduled Meds:  . Chlorhexidine Gluconate Cloth  6 each Topical Daily  . mirtazapine  7.5 mg Oral QHS  . sodium chloride flush  10-40 mL Intracatheter Q12H  . tiotropium  1 capsule Inhalation Daily    Continuous Infusions: . sodium chloride 75 mL/hr at 01/06/17 1000  . diltiazem (CARDIZEM) infusion Stopped (01/23/2017 0200)    PRN Meds: acetaminophen **OR** acetaminophen, ALPRAZolam, bisacodyl, HYDROcodone-acetaminophen, levalbuterol, morphine injection, morphine CONCENTRATE, ondansetron **OR** ondansetron (ZOFRAN) IV, sodium chloride flush  Physical Exam  Constitutional: No distress.  HENT:  Head: Atraumatic.  Cardiovascular: Normal rate.   Pulmonary/Chest:  Shallow breathing, open  mouth breathing  Abdominal: Soft. He exhibits no distension.  Musculoskeletal: He exhibits no edema.  Muscular atrophy  Neurological:  Does not respond to commands at this time  Skin: Skin is warm and dry.  Nursing note and vitals reviewed.           Vital Signs: BP (!) 115/46   Pulse 72   Temp (!) 93.6 F (34.2 C) (Rectal) Comment: nurse aware  Resp (!) 26   Ht 5\' 5"  (1.651 m)   Wt 55 kg (121 lb 4.1 oz)   SpO2 91%   BMI 20.18 kg/m  SpO2: SpO2: 91 % O2 Device: O2 Device: High Flow Nasal Cannula O2 Flow Rate: O2 Flow Rate (L/min): 8  L/min  Intake/output summary:  Intake/Output Summary (Last 24 hours) at 01/09/2017 1233 Last data filed at 01/11/2017 0500  Gross per 24 hour  Intake            816.5 ml  Output                0 ml  Net            816.5 ml   LBM: Last BM Date: 01/06/17 Baseline Weight: Weight: 57.2 kg (126 lb) Most recent weight: Weight: 55 kg (121 lb 4.1 oz)       Palliative Assessment/Data:    Flowsheet Rows     Most Recent Value  Intake Tab  Referral Department  Hospitalist  Unit at Time of Referral  ICU  Palliative Care Primary Diagnosis  Sepsis/Infectious Disease  Date Notified  01/05/17  Palliative Care Type  New Palliative care  Reason for referral  Clarify Goals of Care, End of Life Care Assistance, Counsel Regarding Hospice  Date of Admission  01/01/17  Date first seen by Palliative Care  01/06/17  # of days Palliative referral response time  1 Day(s)  # of days IP prior to Palliative referral  4  Clinical Assessment  Palliative Performance Scale Score  20%  Pain Max last 24 hours  Not able to report  Pain Min Last 24 hours  Not able to report  Dyspnea Max Last 24 Hours  Not able to report  Dyspnea Min Last 24 hours  Not able to report  Psychosocial & Spiritual Assessment  Palliative Care Outcomes  Patient/Family meeting held?  Yes  Who was at the meeting?  With wife in PMT office  [place] regarding hospice,  Changed to focus on comfort, Provided end of life care assistance, Clarified goals of care  Patient/Family wishes: Interventions discontinued/not started   Mechanical Ventilation      Patient Active Problem List   Diagnosis Date Noted  . Palliative care encounter   . Goals of care, counseling/discussion   . Encounter for hospice care discussion   . Anemia of chronic disease 01/05/2017  . Acute blood loss anemia 01/05/2017  . Permanent atrial fibrillation ([place]) 01/05/2017  . Sepsis due to undetermined organism ([place]) 01/01/2017  . Acute metabolic encephalopathy 32/35/5732  . Atrial fibrillation with RVR ([place]) 01/01/2017  . AKI (acute kidney injury) ([place])   . UTI (urinary tract infection) due to Enterococcus 12/28/2016  . Atrial fibrillation, chronic ([place]) 12/28/2016  . Pressure injury of skin 07/02/2016  . Atrial fibrillation with rapid ventricular response ([place]) 06/29/2016  . Congestive heart failure ([place]) 06/29/2016  . Cellulitis of right leg 06/24/2016  . Sepsis ([place]) 06/24/2016  . HTN (hypertension) 06/24/2016  . Neurogenic bladder 06/24/2016  . Chronic indwelling Foley catheter 06/24/2016    Palliative Care Assessment & Plan   Patient Profile: 76 y.o. male  with past medical history of COPD, Charcot Lelan Pons tooth muscular atrophy, hypertension, a fee of, neurogenic bladder with chronic indwelling Foley catheter history of back surgery 5 years ago, history of pneumonia admitted on 01/01/2017 with sepsis presumably secondary to UTI.   Assessment: sepsis presumably secondary to UTI: Treated with IV antibiotics without improvement. Wife chose comfort and dignity, let nature take its course, transitioned to hospice home of [facility].  Recommendations/Plan:  transfer to [facility] for comfort and dignity at end-of-life  Goals of Care and Additional Recommendations:  Limitations on Scope of Treatment: Full Comfort Care  Code Status:    Code Status  Orders        Start     Ordered   01/03/17 0729  Do not attempt resuscitation (DNR)  Continuous    Question Answer Comment  In the event of cardiac or respiratory ARREST Do not call a "code blue"   In the event of cardiac or respiratory ARREST Do not perform Intubation, CPR, defibrillation or ACLS   In the event of cardiac or respiratory ARREST Use medication by any route, position, wound care, and other measures to relive pain and suffering. May use oxygen, suction and manual treatment of airway obstruction as needed for comfort.      01/03/17 0728    Code Status History    Date Active Date Inactive Code Status Order ID Comments User Context   01/01/2017  1:30 PM 01/03/2017  7:28 AM Full Code [id number]  Orson Eva, MD Inpatient   12/28/2016  5:05 PM 12/30/2016  4:58 PM Full Code [id number]  Truett Mainland, DO Inpatient   06/24/2016  4:36 PM 07/06/2016  3:50 PM Full Code [id number]  Isaac Bliss, Rayford Halsted, MD Inpatient       Prognosis:   < 2 weeks  Discharge Planning:  Hospice home of [facility] for comfort and dignity at end-of-life.  Care plan was discussed with nursing staff, social worker, case manager, and Dr. Luan Pulling on next rounds.  Thank you for allowing the Palliative Medicine Team to assist in the care of this patient.   Time In: 0905  Time Out: 0920  Total Time 15 minutes  Prolonged Time Billed  no       Greater than 50%  of this time was spent counseling and coordinating care related to the above assessment and plan.  Drue Novel, NP  Please contact Palliative Medicine Team phone at [phone] for questions and concerns.

## 2017-01-07 NOTE — Progress Notes (Signed)
Subjective: Events of yesterday noted. His wife has elected for comfort measures which I think is appropriate.  Objective: Vital signs in last 24 hours: Temp:  [93.6 F (34.2 C)-97.6 F (36.4 C)] 93.6 F (34.2 C) (08/14 0740) Pulse Rate:  [72-116] 72 (08/14 0740) Resp:  [21-27] 26 (08/14 0740) BP: (98-115)/(43-65) 115/46 (08/14 0600) SpO2:  [91 %-99 %] 91 % (08/14 0740) Weight:  [55 kg (121 lb 4.1 oz)] 55 kg (121 lb 4.1 oz) (08/14 0500) Weight change: 2.04 kg (4 lb 8 oz) Last BM Date: 01/06/17  Intake/Output from previous day: 08/13 0701 - 08/14 0700 In: 846.5 [P.O.:30; I.V.:816.5] Out: -   PHYSICAL EXAM General appearance: Awake looking around but not responsive Resp: rhonchi bilaterally Cardio: irregularly irregular rhythm GI: soft, non-tender; bowel sounds normal; no masses,  no organomegaly Extremities: extremities normal, atraumatic, no cyanosis or edema Skin turgor poor  Lab Results:  Results for orders placed or performed during the hospital encounter of 01/01/17 (from the past 48 hour(s))  CBC with Differential/Platelet     Status: Abnormal   Collection Time: 01/06/17  8:47 AM  Result Value Ref Range   WBC 30.4 (H) 4.0 - 10.5 K/uL    Comment: WHITE COUNT CONFIRMED ON SMEAR   RBC 3.27 (L) 4.22 - 5.81 MIL/uL   Hemoglobin 10.5 (L) 13.0 - 17.0 g/dL   HCT 32.0 (L) 39.0 - 52.0 %   MCV 97.9 78.0 - 100.0 fL   MCH 32.1 26.0 - 34.0 pg   MCHC 32.8 30.0 - 36.0 g/dL   RDW 19.9 (H) 11.5 - 15.5 %   Platelets 220 150 - 400 K/uL   Neutrophils Relative % 94 %   Neutro Abs 28.7 (H) 1.7 - 7.7 K/uL   Lymphocytes Relative 2 %   Lymphs Abs 0.5 (L) 0.7 - 4.0 K/uL   Monocytes Relative 4 %   Monocytes Absolute 1.2 (H) 0.1 - 1.0 K/uL   Eosinophils Relative 0 %   Eosinophils Absolute 0.0 0.0 - 0.7 K/uL   Basophils Relative 0 %   Basophils Absolute 0.0 0.0 - 0.1 K/uL  Comprehensive metabolic panel     Status: Abnormal   Collection Time: 01/06/17  8:47 AM  Result Value Ref Range    Sodium 140 135 - 145 mmol/L   Potassium 3.4 (L) 3.5 - 5.1 mmol/L   Chloride 114 (H) 101 - 111 mmol/L   CO2 19 (L) 22 - 32 mmol/L   Glucose, Bld 313 (H) 65 - 99 mg/dL   BUN 15 6 - 20 mg/dL   Creatinine, Ser 0.86 0.61 - 1.24 mg/dL   Calcium 7.7 (L) 8.9 - 10.3 mg/dL   Total Protein 5.2 (L) 6.5 - 8.1 g/dL   Albumin 2.3 (L) 3.5 - 5.0 g/dL   AST 13 (L) 15 - 41 U/L   ALT 12 (L) 17 - 63 U/L   Alkaline Phosphatase 41 38 - 126 U/L   Total Bilirubin 0.6 0.3 - 1.2 mg/dL   GFR calc non Af Amer >60 >60 mL/min   GFR calc Af Amer >60 >60 mL/min    Comment: (NOTE) The eGFR has been calculated using the CKD EPI equation. This calculation has not been validated in all clinical situations. eGFR's persistently <60 mL/min signify possible Chronic Kidney Disease.    Anion gap 7 5 - 15    ABGS No results for input(s): PHART, PO2ART, TCO2, HCO3 in the last 72 hours.  Invalid input(s): PCO2 CULTURES Recent Results (from the past  240 hour(s))  Urine culture     Status: Abnormal   Collection Time: 12/28/16 11:24 AM  Result Value Ref Range Status   Specimen Description URINE, CLEAN CATCH  Final   Special Requests Normal  Final   Culture >=100,000 COLONIES/mL ENTEROCOCCUS FAECALIS (A)  Final   Report Status 12/30/2016 FINAL  Final   Organism ID, Bacteria ENTEROCOCCUS FAECALIS (A)  Final      Susceptibility   Enterococcus faecalis - MIC*    AMPICILLIN <=2 SENSITIVE Sensitive     LEVOFLOXACIN >=8 RESISTANT Resistant     NITROFURANTOIN 128 RESISTANT Resistant     VANCOMYCIN 1 SENSITIVE Sensitive     * >=100,000 COLONIES/mL ENTEROCOCCUS FAECALIS  Culture, blood (Routine X 2) w Reflex to ID Panel     Status: None   Collection Time: 01/01/17  8:49 AM  Result Value Ref Range Status   Specimen Description BLOOD LEFT ARM  Final   Special Requests   Final    BOTTLES DRAWN AEROBIC AND ANAEROBIC Blood Culture results may not be optimal due to an excessive volume of blood received in culture bottles    Culture NO GROWTH 5 DAYS  Final   Report Status 01/06/2017 FINAL  Final  Culture, blood (Routine X 2) w Reflex to ID Panel     Status: None   Collection Time: 01/01/17  8:57 AM  Result Value Ref Range Status   Specimen Description BLOOD LEFT ARM  Final   Special Requests   Final    BOTTLES DRAWN AEROBIC AND ANAEROBIC Blood Culture adequate volume   Culture NO GROWTH 5 DAYS  Final   Report Status 01/06/2017 FINAL  Final  MRSA PCR Screening     Status: None   Collection Time: 01/01/17  2:35 PM  Result Value Ref Range Status   MRSA by PCR NEGATIVE NEGATIVE Final    Comment:        The GeneXpert MRSA Assay (FDA approved for NASAL specimens only), is one component of a comprehensive MRSA colonization surveillance program. It is not intended to diagnose MRSA infection nor to guide or monitor treatment for MRSA infections.   Culture, Urine     Status: Abnormal   Collection Time: 01/01/17  2:50 PM  Result Value Ref Range Status   Specimen Description URINE, CLEAN CATCH  Final   Special Requests NONE  Final   Culture >=100,000 COLONIES/mL ENTEROCOCCUS FAECALIS (A)  Final   Report Status 01/04/2017 FINAL  Final   Organism ID, Bacteria ENTEROCOCCUS FAECALIS (A)  Final      Susceptibility   Enterococcus faecalis - MIC*    AMPICILLIN <=2 SENSITIVE Sensitive     LEVOFLOXACIN >=8 RESISTANT Resistant     NITROFURANTOIN 128 RESISTANT Resistant     VANCOMYCIN 1 SENSITIVE Sensitive     * >=100,000 COLONIES/mL ENTEROCOCCUS FAECALIS   Studies/Results: Dg Chest Port 1 View  Result Date: 01/05/2017 CLINICAL DATA:  Respiratory failure EXAM: PORTABLE CHEST 1 VIEW COMPARISON:  01/01/2017 FINDINGS: Cardiac shadow remains enlarged. Right-sided PICC line is again seen. Increasing bilateral pleural effusions as well as vascular congestion and edema is seen no bony abnormality is noted. IMPRESSION: Changes consistent with CHF with edema and bilateral pleural effusions. Electronically Signed   By: Inez Catalina M.D.   On: 01/05/2017 09:44    Medications:  Prior to Admission:  Prescriptions Prior to Admission  Medication Sig Dispense Refill Last Dose  . ALPRAZolam (XANAX) 0.25 MG tablet Take 0.25 mg by mouth 5 (  five) times daily as needed for anxiety.    12/31/2016 at Unknown time  . apixaban (ELIQUIS) 2.5 MG TABS tablet Take 1 tablet (2.5 mg total) by mouth 2 (two) times daily. 60 tablet  12/31/2016 at 2000  . CRANBERRY PO Take 1 tablet by mouth daily.   12/31/2016 at Unknown time  . diltiazem (CARDIZEM LA) 240 MG 24 hr tablet Take 1 tablet (240 mg total) by mouth daily.   12/31/2016 at Unknown time  . Docusate Calcium (STOOL SOFTENER PO) Take 1 tablet by mouth 2 (two) times daily.   01/01/2017 at Unknown time  . escitalopram (LEXAPRO) 10 MG tablet Take 1 tablet by mouth daily.   12/31/2016 at Unknown time  . HYDROcodone-acetaminophen (NORCO) 10-325 MG tablet Take 1-2 tablets by mouth every 4 (four) hours as needed for pain.   01/01/2017 at Unknown time  . lidocaine (XYLOCAINE) 2 % jelly Apply 1 application topically once.   Past Month at Unknown time  . Meth-Hyo-M Bl-Na Phos-Ph Sal (URIBEL) 118 MG CAPS Take 1 capsule by mouth every 6 (six) hours.   12/31/2016 at Unknown time  . polyethylene glycol (MIRALAX / GLYCOLAX) packet Take 17 g by mouth daily as needed.   Past Week at Unknown time  . SPIRIVA HANDIHALER 18 MCG inhalation capsule Place 1 capsule into inhaler and inhale daily.   12/31/2016 at Unknown time  . torsemide (DEMADEX) 100 MG tablet Take 0.5 tablets by mouth daily.   12/31/2016 at Unknown time  . Tripelennamine HCl POWD Take 25 mg by mouth 2 (two) times daily.    12/31/2016 at Unknown time  . vancomycin 500 mg in sodium chloride 0.9 % 100 mL Inject 500 mg into the vein daily. 10 Dose 0 12/31/2016 at Unknown time   Scheduled: . Chlorhexidine Gluconate Cloth  6 each Topical Daily  . mirtazapine  7.5 mg Oral QHS  . sodium chloride flush  10-40 mL Intracatheter Q12H  . tiotropium  1 capsule Inhalation  Daily   Continuous: . sodium chloride 75 mL/hr at 01/06/17 1000  . diltiazem (CARDIZEM) infusion Stopped (01/08/2017 0200)   JXB:[id number] **OR** acetaminophen, ALPRAZolam, bisacodyl, HYDROcodone-acetaminophen, levalbuterol, morphine injection, ondansetron **OR** ondansetron (ZOFRAN) IV, sodium chloride flush  Assesment: He was admitted with sepsis. He has acute metabolic encephalopathy. He has chronic atrial fib. He probably has some element of heart failure now. Comfort care measures are underway. Active Problems:   Sepsis ([place])   Pressure injury of skin   Sepsis due to undetermined organism ([place])   Acute metabolic encephalopathy   Atrial fibrillation with RVR (HCC)   Anemia of chronic disease   Acute blood loss anemia   Permanent atrial fibrillation [facility])   Palliative care encounter   Goals of care, counseling/discussion   Encounter for hospice care discussion    Plan: He may transfer to hospice inpatient facility.    LOS: 6 days   Arland Usery L 12/30/2016, 9:05 AM

## 2017-01-07 NOTE — Clinical Social Work Note (Signed)
LCSW sent discharge summary to [facility] and arranged transport.   LCSW notified patient's wife of discharge.  LCSW signing off.      Sohil Timko, Clydene Pugh, LCSW

## 2017-01-07 NOTE — Care Management Note (Signed)
Case Management Note  Patient Details  Name: Erik Atkins MRN: [id number] Date of Birth: 08/17/1940   If discussed at Long Length of Stay Meetings, dates discussed:   01/02/2017 Additional Comments:  [place] Takacs, [name], RN 12/31/2016, 10:29 AM

## 2017-01-07 NOTE — Discharge Summary (Signed)
Physician Discharge Summary  Patient ID: EAGLE PITTA MRN: [id number] DOB/AGE: [birth year] 76 y.o. 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   Darvon Swelling   Celexa [citalopram Hydrobromide] Nausea And Vomiting   Cortisone Swelling   Cymbalta [duloxetine Hcl] Nausea Only   Eggs Or Egg-derived Products Nausea And Vomiting   Guaifenesin Er    Keflex [cephalexin] Swelling   Motrin [ibuprofen]    Pumpkin Seed Nausea And Vomiting   All pumpkin products   Sweet Potato Nausea And Vomiting      Tea Nausea And Vomiting   Zoloft [sertraline] Nausea Only      Medication List    STOP taking these medications   ALPRAZolam 0.25 MG tablet Commonly known as:  XANAX   apixaban 2.5 MG Tabs tablet Commonly known as:  ELIQUIS   CRANBERRY PO   diltiazem 240 MG 24 hr tablet Commonly known as:  CARDIZEM LA   escitalopram 10 MG tablet Commonly known  as:  LEXAPRO   HYDROcodone-acetaminophen 10-325 MG tablet Commonly known as:  NORCO   lidocaine 2 % jelly Commonly known as:  XYLOCAINE   polyethylene glycol packet Commonly known as:  MIRALAX / GLYCOLAX   SPIRIVA HANDIHALER 18 MCG inhalation capsule Generic drug:  tiotropium   STOOL SOFTENER PO   torsemide 100 MG tablet Commonly known as:  DEMADEX   Tripelennamine HCl Powd   URIBEL 118 MG Caps   vancomycin 500 mg in sodium chloride 0.9 % 100 mL       Discharged Condition:Unchanged    Consults: Palliative care  Significant Diagnostic Studies: Ct Head Wo Contrast  Result Date: 01/01/2017 CLINICAL DATA:  Altered mental status. EXAM: CT HEAD WITHOUT CONTRAST TECHNIQUE: Contiguous axial images were obtained from the base of the skull through the vertex without intravenous contrast. COMPARISON:  06/27/2016 FINDINGS: Brain: There is no evidence for acute hemorrhage, hydrocephalus, mass lesion, or abnormal extra-axial fluid collection. No definite CT evidence for acute infarction. Diffuse loss of parenchymal volume is consistent with atrophy. Patchy low attenuation in the deep hemispheric and periventricular white matter is nonspecific, but likely reflects chronic microvascular ischemic demyelination. Vascular: No hyperdense vessel or unexpected calcification. Skull: No evidence for fracture. No worrisome lytic or sclerotic lesion. Sinuses/Orbits: The visualized paranasal sinuses and mastoid air cells are clear. Visualized portions of the globes and intraorbital fat are unremarkable. Other: None. IMPRESSION: 1. Stable.  No acute intracranial abnormality. 2. Atrophy with chronic small  vessel white matter ischemic disease. Electronically Signed   By: Misty Stanley M.D.   On: 01/01/2017 09:50   Dg Chest Port 1 View  Result Date: 01/05/2017 CLINICAL DATA:  Respiratory failure EXAM: PORTABLE CHEST 1 VIEW COMPARISON:  01/01/2017 FINDINGS: Cardiac shadow remains enlarged. Right-sided PICC  line is again seen. Increasing bilateral pleural effusions as well as vascular congestion and edema is seen no bony abnormality is noted. IMPRESSION: Changes consistent with CHF with edema and bilateral pleural effusions. Electronically Signed   By: Inez Catalina M.D.   On: 01/05/2017 09:44   Dg Chest Port 1 View  Result Date: 01/01/2017 CLINICAL DATA:  PICC line placed on Sat/copd/hx pneumonia/htn/ex smoker EXAM: PORTABLE CHEST 1 VIEW COMPARISON:  12/28/2016. FINDINGS: PICC line terminates at the mid to low SVC. Midline trachea. Normal heart size. Atherosclerosis in the transverse aorta. No pleural effusion or pneumothorax. The Chin overlies the apices minimally. Diffuse peribronchial thickening. Mild hyperinflation. Prominent gas-filled bowel loops in the upper abdomen are incompletely imaged. IMPRESSION: No acute cardiopulmonary disease. Peribronchial thickening which may relate to chronic bronchitis or smoking. Electronically Signed   By: Abigail Miyamoto M.D.   On: 01/01/2017 09:06   Dg Chest Portable 1 View  Result Date: 12/28/2016 CLINICAL DATA:  PICC line placement EXAM: PORTABLE CHEST 1 VIEW COMPARISON:  Chest x-ray dated 06/27/2016. FINDINGS: Right-sided PICC line in place with tip adequately positioned at the level of the mid/lower SVC. Coarse lung markings again noted bilaterally suggesting chronic interstitial lung disease/ fibrosis. No pleural effusion or pneumothorax seen. No evidence of pneumonia. Heart size and mediastinal contours are stable. Atherosclerotic changes noted at the aortic arch. IMPRESSION: 1. Right-sided PICC line in place with tip adequately positioned at the level of the mid/lower SVC. No pneumothorax. 2. Chronic interstitial lung disease/fibrosis, stable. No evidence of pneumonia or pulmonary edema. 3. Aortic atherosclerosis. Electronically Signed   By: Franki Cabot M.D.   On: 12/28/2016 13:33    Lab Results: Basic Metabolic Panel:  Recent Labs  01/05/17 0427  01/06/17 0847  NA 147* 140  K 3.9 3.4*  CL 122* 114*  CO2 19* 19*  GLUCOSE 125* 313*  BUN 20 15  CREATININE 0.96 0.86  CALCIUM 7.9* 7.7*   Liver Function Tests:  Recent Labs  01/06/17 0847  AST 13*  ALT 12*  ALKPHOS 41  BILITOT 0.6  PROT 5.2*  ALBUMIN 2.3*     CBC:  Recent Labs  01/05/17 0427 01/06/17 0847  WBC 29.2* 30.4*  NEUTROABS 24.8* 28.7*  HGB 11.0* 10.5*  HCT 33.8* 32.0*  MCV 98.3 97.9  PLT 280 220    Recent Results (from the past 240 hour(s))  Urine culture     Status: Abnormal   Collection Time: 12/28/16 11:24 AM  Result Value Ref Range Status   Specimen Description URINE, CLEAN CATCH  Final   Special Requests Normal  Final   Culture >=100,000 COLONIES/mL ENTEROCOCCUS FAECALIS (A)  Final   Report Status 12/30/2016 FINAL  Final   Organism ID, Bacteria ENTEROCOCCUS FAECALIS (A)  Final      Susceptibility   Enterococcus faecalis - MIC*    AMPICILLIN <=2 SENSITIVE Sensitive     LEVOFLOXACIN >=8 RESISTANT Resistant     NITROFURANTOIN 128 RESISTANT Resistant     VANCOMYCIN 1 SENSITIVE Sensitive     * >=100,000 COLONIES/mL ENTEROCOCCUS FAECALIS  Culture, blood (Routine X 2) w Reflex to ID Panel     Status: None   Collection Time: 01/01/17  8:49 AM  Result Value Ref Range Status   Specimen Description BLOOD LEFT ARM  Final   Special Requests   Final    BOTTLES DRAWN AEROBIC AND ANAEROBIC Blood Culture results may not be optimal due to an excessive volume of blood received in culture bottles   Culture NO GROWTH 5 DAYS  Final   Report Status 01/06/2017 FINAL  Final  Culture, blood (Routine X 2) w Reflex to ID Panel     Status: None   Collection Time: 01/01/17  8:57 AM  Result Value Ref Range Status   Specimen Description BLOOD LEFT ARM  Final   Special Requests   Final    BOTTLES DRAWN AEROBIC AND ANAEROBIC Blood Culture adequate volume   Culture NO GROWTH 5 DAYS  Final   Report Status 01/06/2017 FINAL  Final  MRSA PCR Screening     Status: None    Collection Time: 01/01/17  2:35 PM  Result Value Ref Range Status   MRSA by PCR NEGATIVE NEGATIVE Final    Comment:        The GeneXpert MRSA Assay (FDA approved for NASAL specimens only), is one component of a comprehensive MRSA colonization surveillance program. It is not intended to diagnose MRSA infection nor to guide or monitor treatment for MRSA infections.   Culture, Urine     Status: Abnormal   Collection Time: 01/01/17  2:50 PM  Result Value Ref Range Status   Specimen Description URINE, CLEAN CATCH  Final   Special Requests NONE  Final   Culture >=100,000 COLONIES/mL ENTEROCOCCUS FAECALIS (A)  Final   Report Status 01/04/2017 FINAL  Final   Organism ID, Bacteria ENTEROCOCCUS FAECALIS (A)  Final      Susceptibility   Enterococcus faecalis - MIC*    AMPICILLIN <=2 SENSITIVE Sensitive     LEVOFLOXACIN >=8 RESISTANT Resistant     NITROFURANTOIN 128 RESISTANT Resistant     VANCOMYCIN 1 SENSITIVE Sensitive     * >=100,000 COLONIES/mL ENTEROCOCCUS FAECALIS     Hospital Course: This is a 76 year old who has had severe problems over the last several months. He's had multiple urinary tract infections. He has been growing enterococcus and was taking vancomycin at home but became septic while on vancomycin. He came to the emergency department was found to be septic and was admitted to the intensive care unit. He had multiple complications including heart failure atrial fib with rapid ventricular response and then developed much worse metabolic encephalopathy. He was not improving. Palliative care consultation was obtained and comfort care has been selected.  Discharge Exam: Blood pressure (!) 115/46, pulse 72, temperature (!) 93.6 F (34.2 C), temperature source Rectal, resp. rate (!) 26, height 5\' 5"  (1.651 m), weight 55 kg (121 lb 4.1 oz), SpO2 91 %. Poorly responsive. Awake but I don't think he is aware. He is in atrial fib. Chest with rhonchi bilaterally.  Disposition: To  hospice inpatient facility. I did not write any orders for symptom control as he will be on protocol at the facility.      Signed: Abella Shugart L   01/15/2017, 9:07 AM

## 2017-01-25 DEATH — deceased

## 2018-06-07 IMAGING — CT CT ABD-PELV W/O CM
2 of 3 series · 16 of 46 positions shown, 18 images · non-contrast
Comparison: 06/22/2012.

CLINICAL DATA: Penile and rectal pain for 2 days.  Constipation.

EXAM:
CT ABDOMEN AND PELVIS WITHOUT CONTRAST
TECHNIQUE: Multidetector CT imaging of the abdomen and pelvis was performed
following the standard protocol without IV contrast.

[Series 2: axial st · axial · 0.77mm/px · z∈[-509,-184]mm · 13 of 75 slices shown, 15 images]
[im 5/75  soft-tissue]
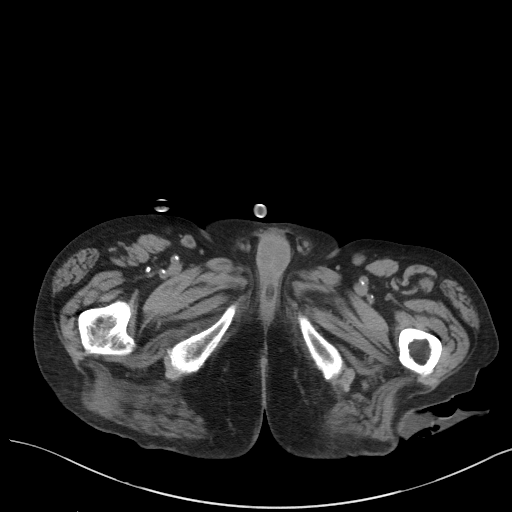
[im 5/75  bone]
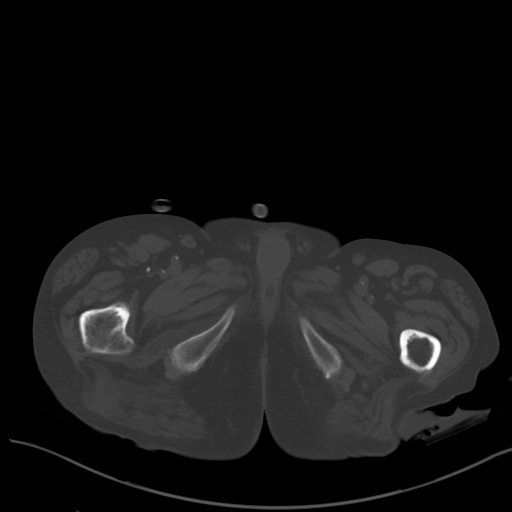
[im 10/75  soft-tissue]
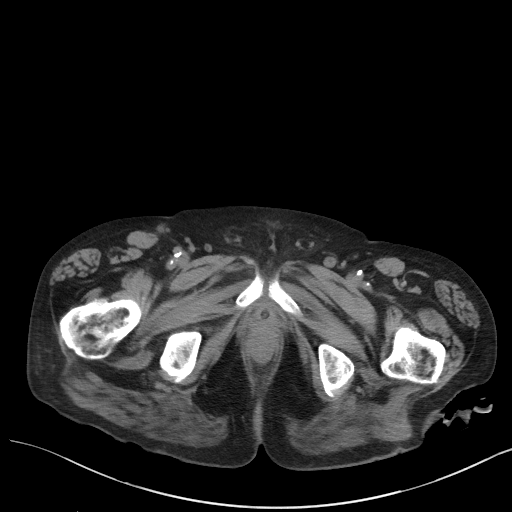
[im 15/75  soft-tissue]
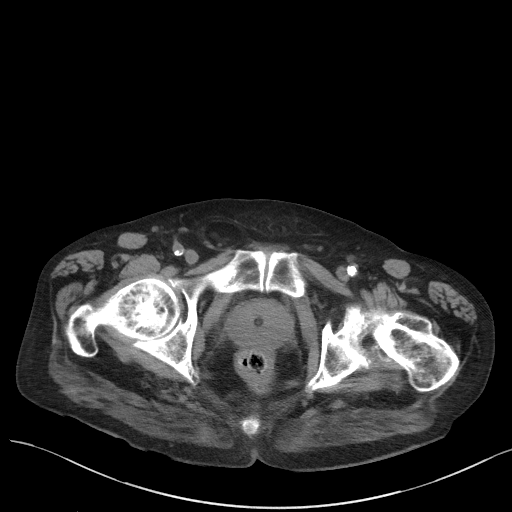
[im 22/75  soft-tissue]
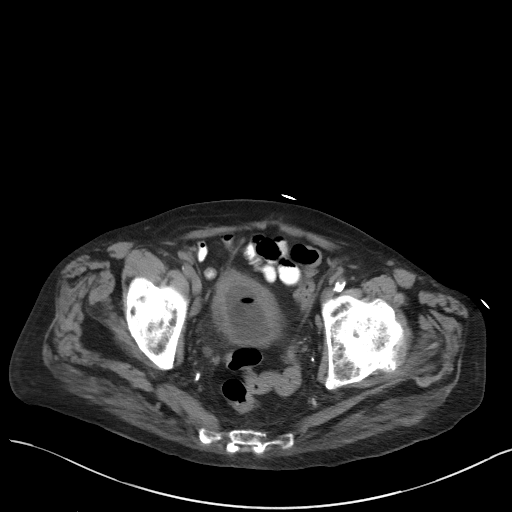
[im 27/75  soft-tissue]
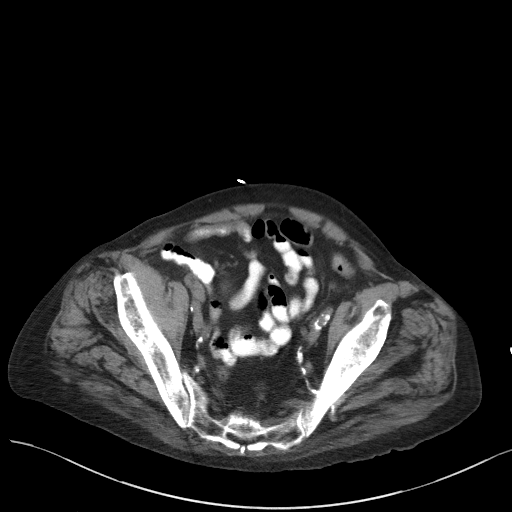
[im 32/75  soft-tissue]
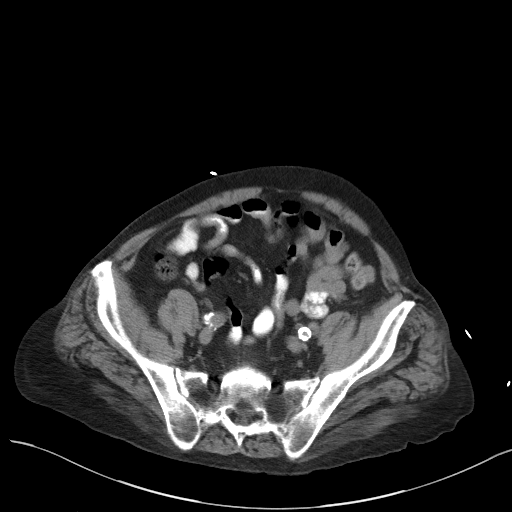
[im 39/75  soft-tissue]
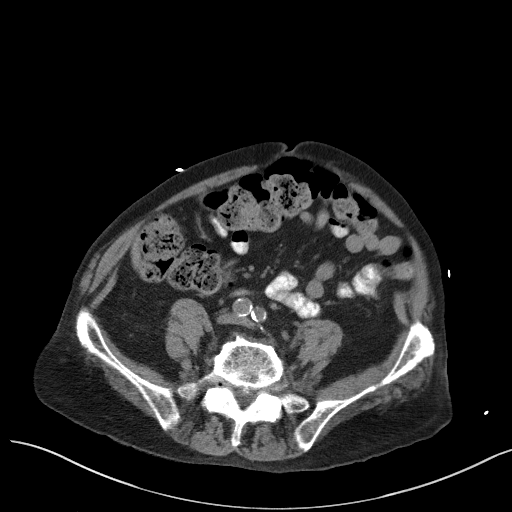
[im 43/75  soft-tissue]
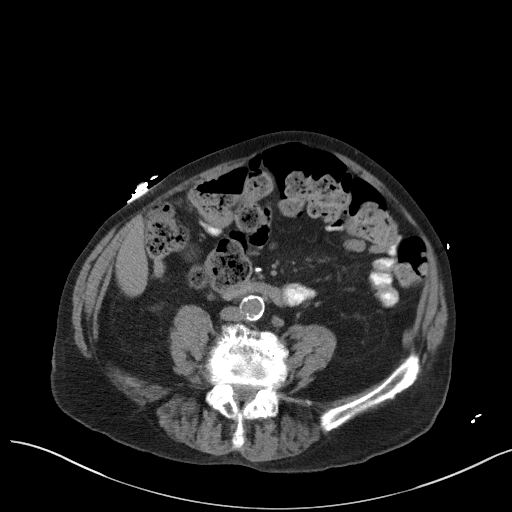
[im 48/75  soft-tissue]
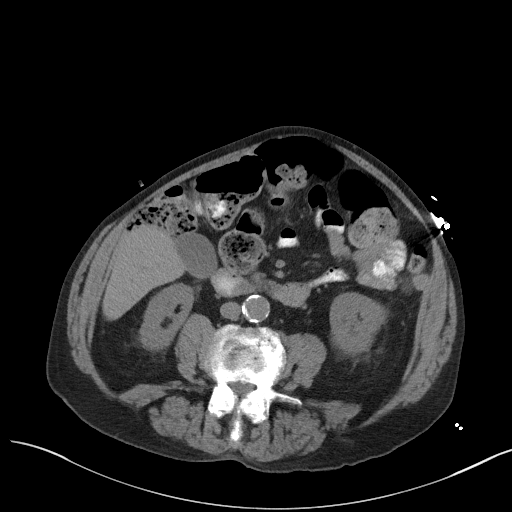
[im 48/75  bone]
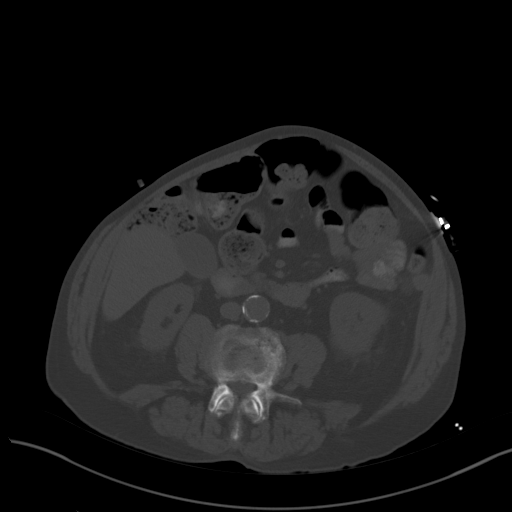
[im 53/75  soft-tissue]
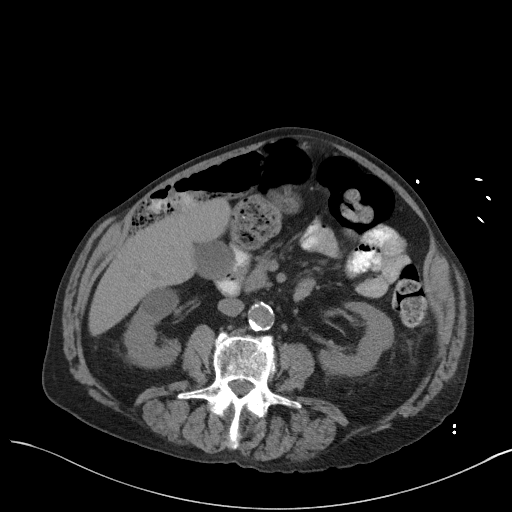
[im 60/75  soft-tissue]
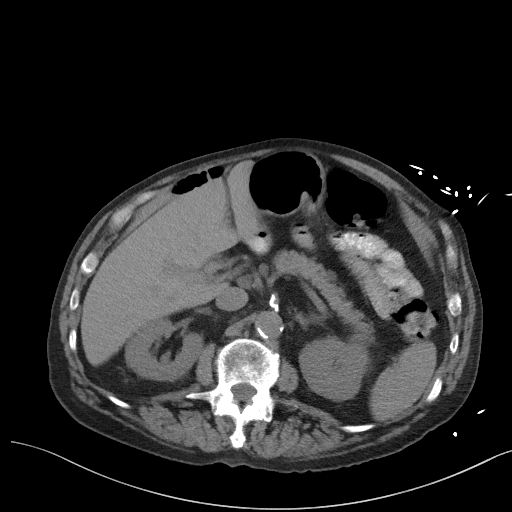
[im 65/75  soft-tissue]
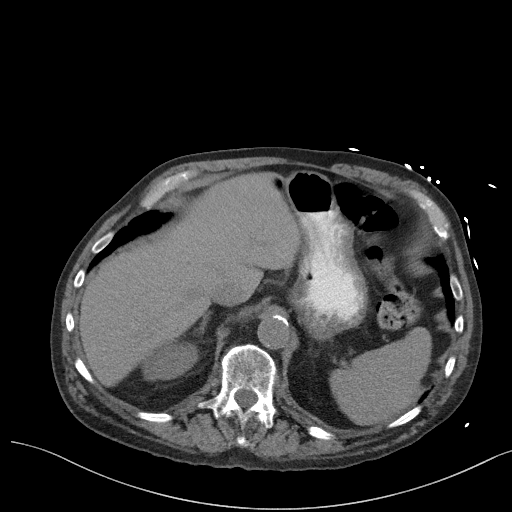
[im 70/75  soft-tissue]
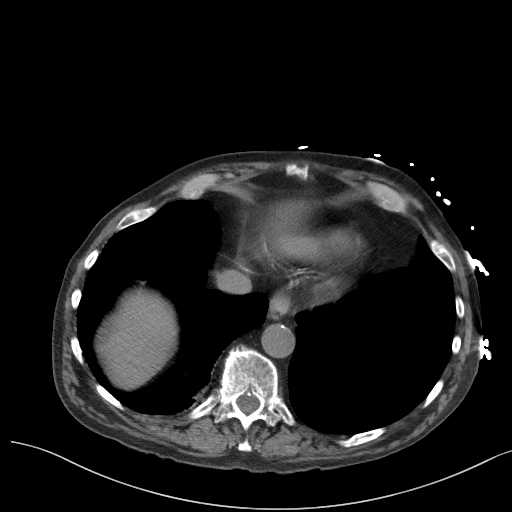

[Series 5: coronal st · coronal · 0.72mm/px · 3 of 103 slices shown]
[im 35/103  soft-tissue]
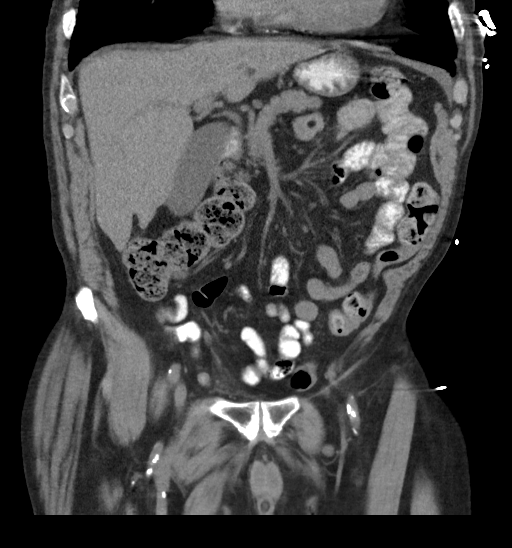
[im 46/103  soft-tissue]
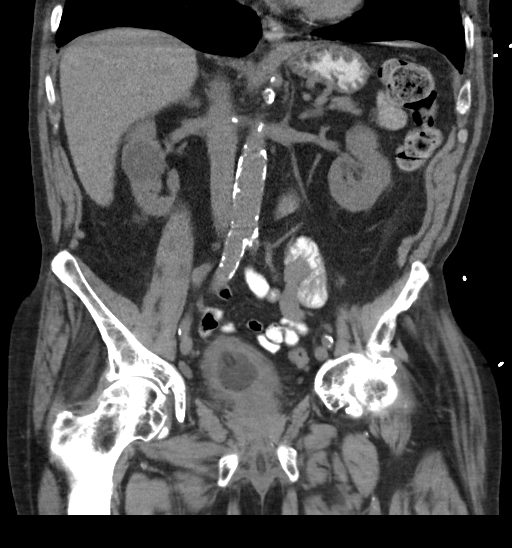
[im 57/103  soft-tissue]
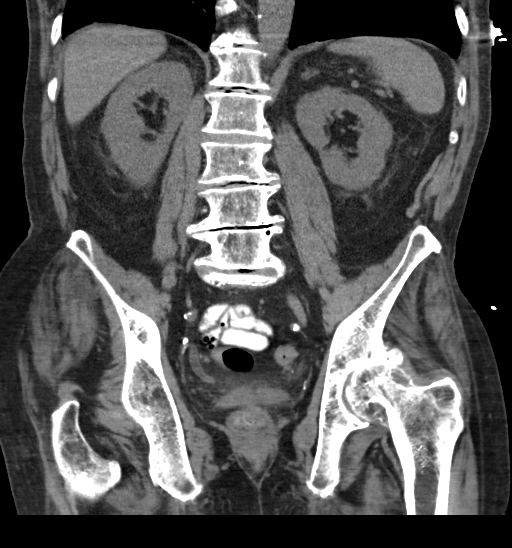

[16 of 46 positions shown; findings below may reference images not displayed]

FINDINGS: Lower chest: Lung bases show mild cylindrical bronchiectasis in both
lower lobes. Heart size normal. Coronary artery calcification. No
pericardial or pleural effusion.

Hepatobiliary: Liver and gallbladder are unremarkable. No biliary
ductal dilatation.

Pancreas: Negative.

Spleen: Negative.

Adrenals/Urinary Tract: Adrenal glands are unremarkable.
Low-attenuation lesions in the kidneys measure up to 3.1 cm on the
right, similar and likely cysts although definitive characterization
is difficult without IV contrast. No urinary stones. Ureters are
decompressed. Foley catheter is seen in a decompressed bladder.

Stomach/Bowel: Stomach and majority of the small bowel are
unremarkable. A short segment of nonobstructed small bowel extends
into a right inguinal hernia. Appendix is not readily visualized.
After amount of stool is seen in the colon.

Vascular/Lymphatic: Atherosclerotic calcification of the arterial
vasculature without abdominal aortic aneurysm. No pathologically
enlarged lymph nodes.

Reproductive: Prostate is visualized.

Other: Right inguinal hernia contains a short segment of
unobstructed small bowel. Small left inguinal hernia contains fat.
No free fluid. Mesenteries and peritoneum are unremarkable.

Musculoskeletal: Degenerative changes are seen in the hips, left
greater than right, and spine. No worrisome lytic or sclerotic
lesions.
IMPRESSION: 1. No acute findings to explain the patient's symptoms.
2. Small right inguinal hernia contains a short segment of
unobstructed small bowel. Small left inguinal hernia contains fat.
3. Fair amount of stool in the colon is indicative of constipation.
4. Aortic atherosclerosis ([id number]-170.0). Coronary artery
calcification.
5. Advanced left hip osteoarthritis.

## 2018-06-10 IMAGING — CT CT HEAD W/O CM
3 series · 16 of 47 positions shown, 19 images · non-contrast
Comparison: Brain MRI 12/27/2011.

CLINICAL DATA: Blurred vision today in patient presenting with
sepsis and cellulitis.

EXAM:
CT HEAD WITHOUT CONTRAST
TECHNIQUE: Contiguous axial images were obtained from the base of the skull
through the vertex without intravenous contrast.

[Series 2: head wo · axial · 0.41mm/px · z∈[+1605,+1730]mm · 10 of 31 slices shown, 13 images]
[im 3/31  brain]
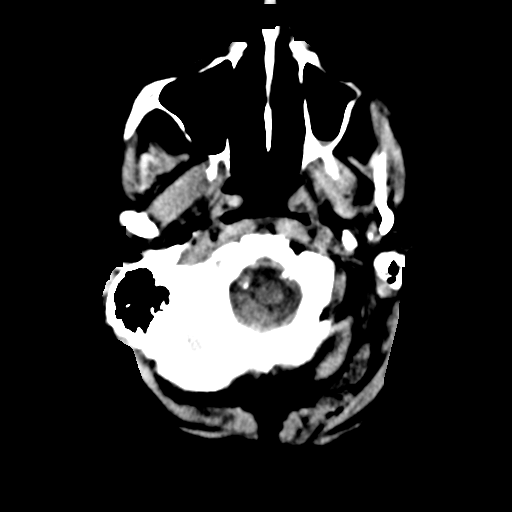
[im 3/31  bone]
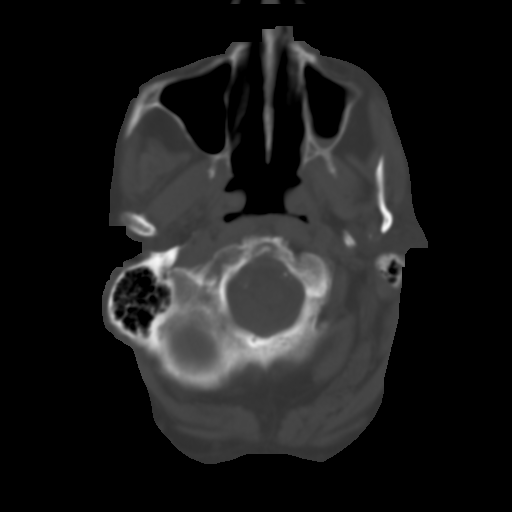
[im 6/31  brain]
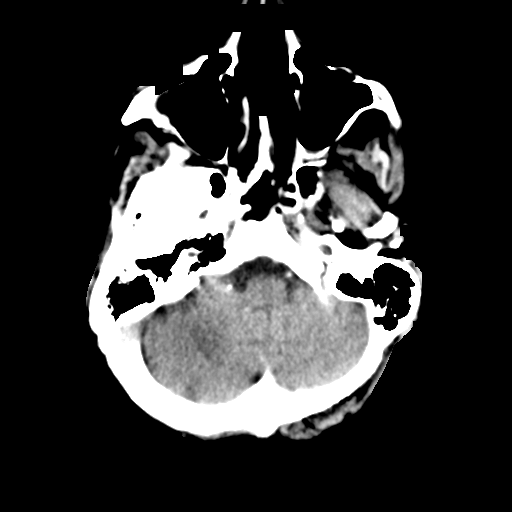
[im 9/31  brain]
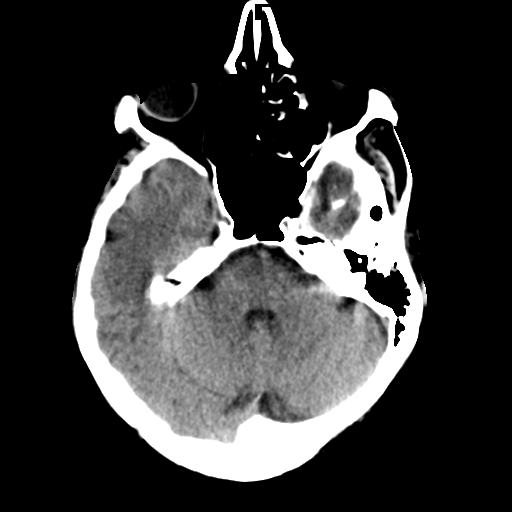
[im 11/31  brain]
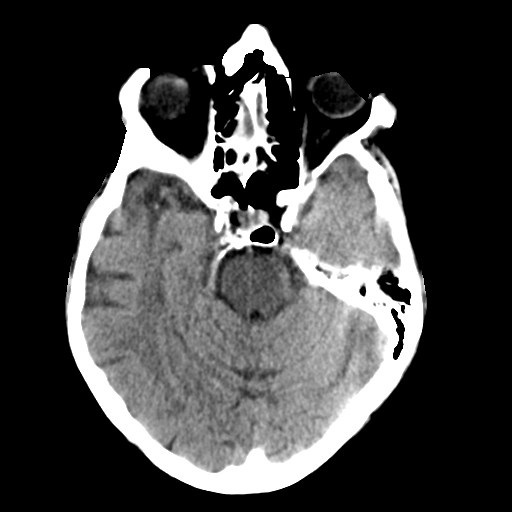
[im 14/31  brain]
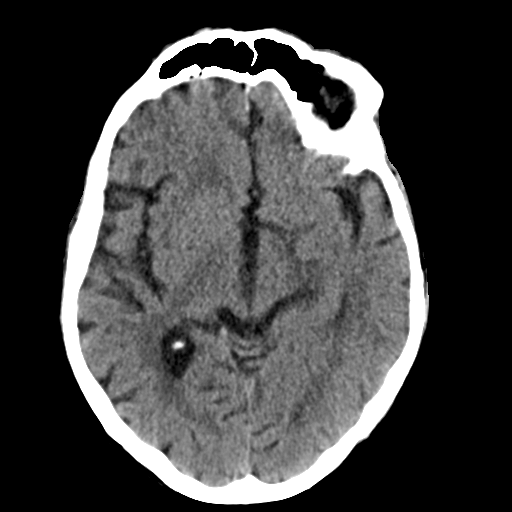
[im 14/31  bone]
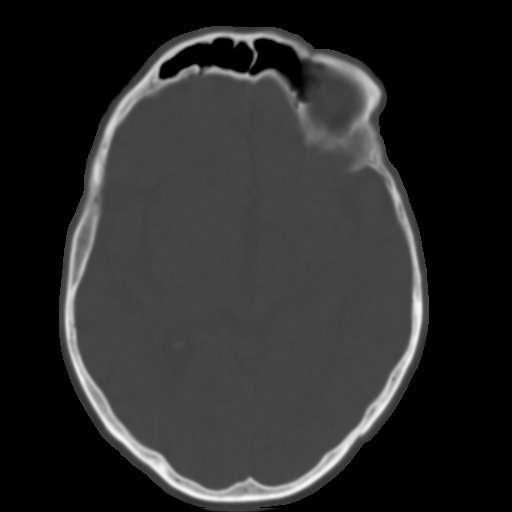
[im 17/31  brain]
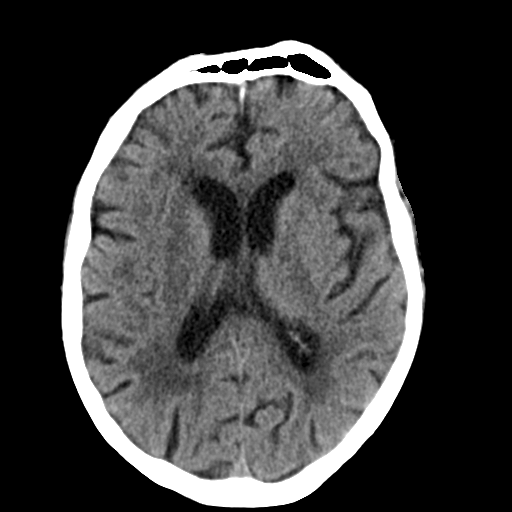
[im 20/31  brain]
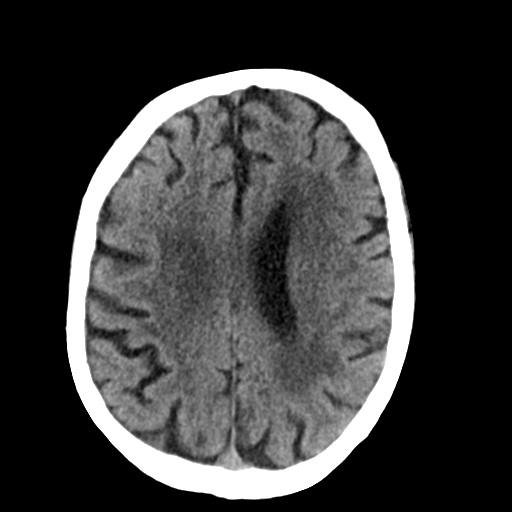
[im 23/31  brain]
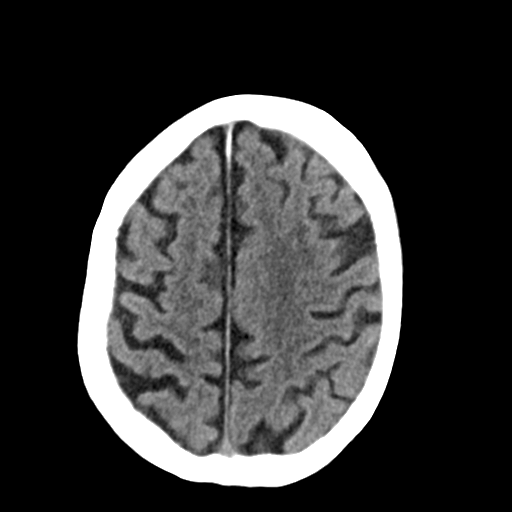
[im 25/31  brain]
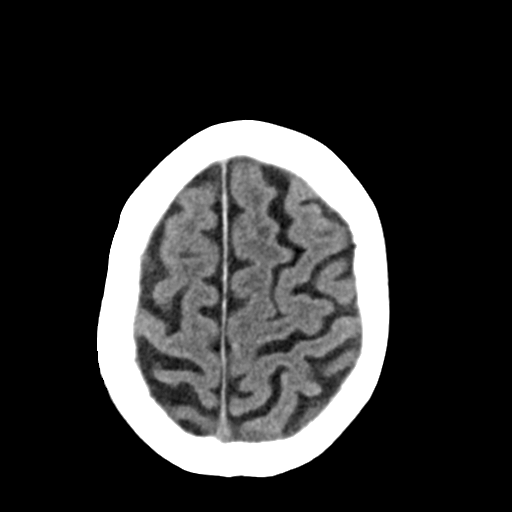
[im 25/31  bone]
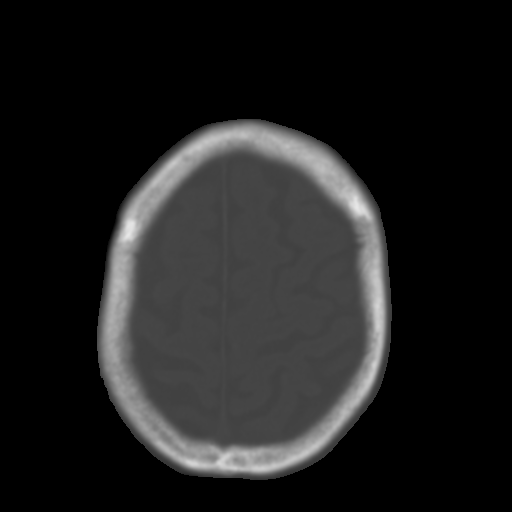
[im 28/31  brain]
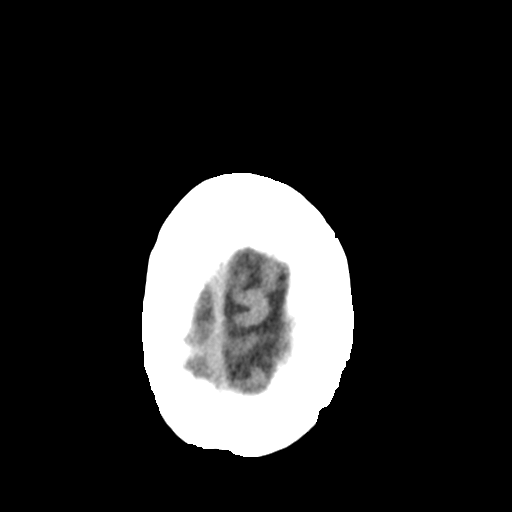

[Series 4: coronal soft tissue · coronal · 0.31mm/px · 3 of 67 slices shown]
[im 23/67  brain]
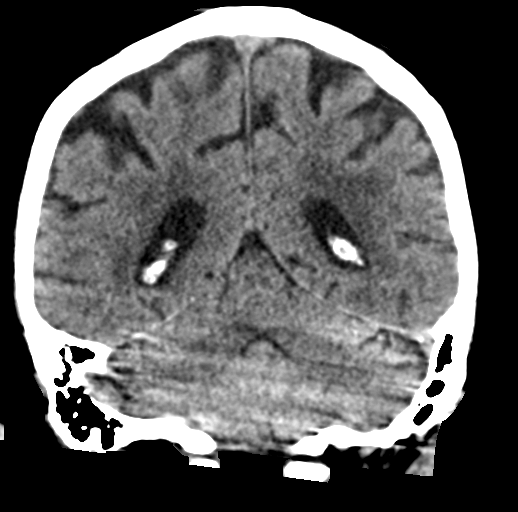
[im 30/67  brain]
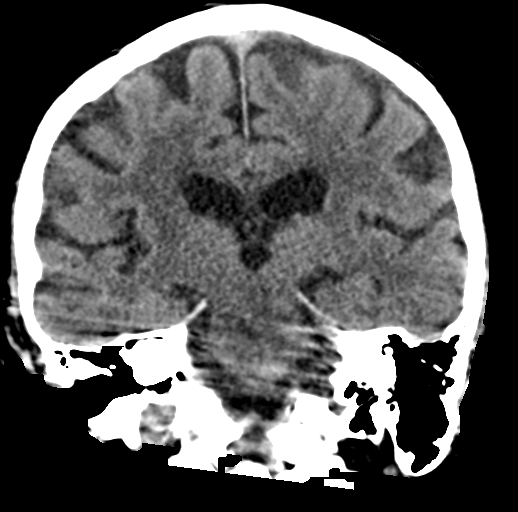
[im 37/67  brain]
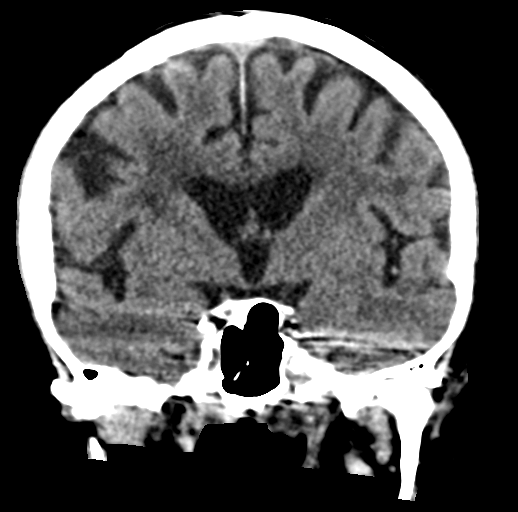

[Series 5: sagittal soft tissue · sagittal · 0.30mm/px · 3 of 51 slices shown]
[im 18/51  brain]
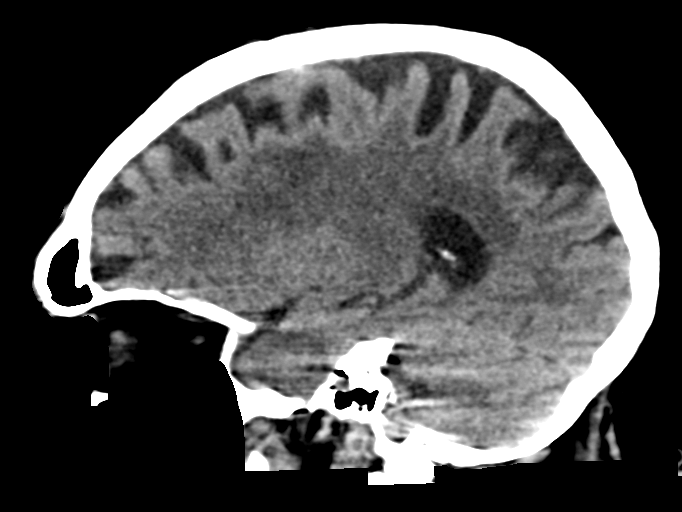
[im 26/51  brain]
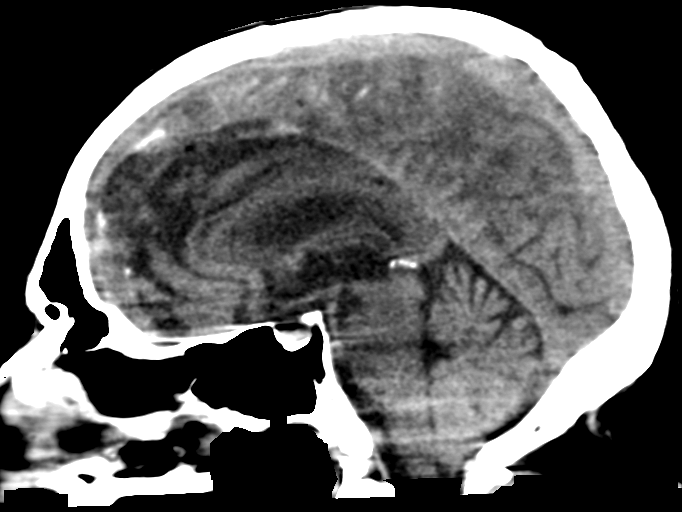
[im 33/51  brain]
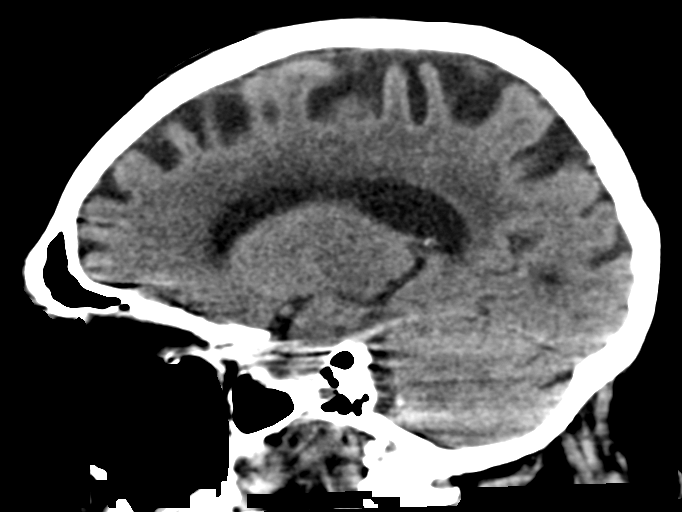

[16 of 47 positions shown; findings below may reference images not displayed]

FINDINGS: Brain: Atrophy and extensive chronic microvascular ischemic change
are seen. No evidence of acute intracranial abnormality including
hemorrhage, infarct, mass lesion, mass effect, midline shift or
abnormal extra-axial fluid collection. No hydrocephalus or
pneumocephalus.

Vascular: Extensive atherosclerosis is seen.

Skull: Intact.

Sinuses/Orbits: Status post bilateral lens extraction. Otherwise
negative.

Other: None.
IMPRESSION: No acute abnormality.

Marked atrophy and extensive chronic microvascular ischemic change.
